# Patient Record
Sex: Female | Born: 1935
Health system: Southern US, Community
[De-identification: ages and names within clinical notes are randomized; demographics above are authoritative.]

## PROBLEM LIST (undated history)

## (undated) DIAGNOSIS — C801 Malignant (primary) neoplasm, unspecified: Secondary | ICD-10-CM

## (undated) DIAGNOSIS — E119 Type 2 diabetes mellitus without complications: Secondary | ICD-10-CM

## (undated) DIAGNOSIS — I6529 Occlusion and stenosis of unspecified carotid artery: Secondary | ICD-10-CM

## (undated) DIAGNOSIS — E7800 Pure hypercholesterolemia, unspecified: Secondary | ICD-10-CM

## (undated) DIAGNOSIS — E039 Hypothyroidism, unspecified: Secondary | ICD-10-CM

## (undated) DIAGNOSIS — K219 Gastro-esophageal reflux disease without esophagitis: Secondary | ICD-10-CM

## (undated) DIAGNOSIS — G629 Polyneuropathy, unspecified: Secondary | ICD-10-CM

## (undated) DIAGNOSIS — I10 Essential (primary) hypertension: Secondary | ICD-10-CM

## (undated) DIAGNOSIS — Z800 Family history of malignant neoplasm of digestive organs: Secondary | ICD-10-CM

## (undated) DIAGNOSIS — Z8739 Personal history of other diseases of the musculoskeletal system and connective tissue: Secondary | ICD-10-CM

## (undated) DIAGNOSIS — R1310 Dysphagia, unspecified: Secondary | ICD-10-CM

## (undated) HISTORY — PX: BACK SURGERY: SHX140

## (undated) HISTORY — DX: Essential (primary) hypertension: I10

## (undated) HISTORY — DX: Hypothyroidism, unspecified: E03.9

## (undated) HISTORY — DX: Family history of malignant neoplasm of digestive organs: Z80.0

## (undated) HISTORY — DX: Occlusion and stenosis of unspecified carotid artery: I65.29

## (undated) HISTORY — PX: CHOLECYSTECTOMY: SHX55

## (undated) HISTORY — PX: KNEE SURGERY: SHX244

## (undated) HISTORY — PX: SPINE SURGERY: SHX786

## (undated) HISTORY — DX: Pure hypercholesterolemia, unspecified: E78.00

## (undated) HISTORY — PX: BREAST LUMPECTOMY: SHX2

---

## 2007-02-28 ENCOUNTER — Ambulatory Visit (HOSPITAL_COMMUNITY): Admission: RE | Admit: 2007-02-28 | Discharge: 2007-02-28 | Payer: Self-pay | Admitting: General Surgery

## 2007-03-02 ENCOUNTER — Ambulatory Visit (HOSPITAL_COMMUNITY): Admission: RE | Admit: 2007-03-02 | Discharge: 2007-03-02 | Payer: Self-pay | Admitting: General Surgery

## 2007-05-19 ENCOUNTER — Ambulatory Visit: Admission: RE | Admit: 2007-05-19 | Discharge: 2007-07-19 | Payer: Self-pay | Admitting: Radiation Oncology

## 2010-04-30 ENCOUNTER — Ambulatory Visit: Payer: Self-pay | Admitting: Internal Medicine

## 2010-04-30 ENCOUNTER — Ambulatory Visit (HOSPITAL_COMMUNITY): Admission: RE | Admit: 2010-04-30 | Discharge: 2010-04-30 | Payer: Self-pay | Admitting: Internal Medicine

## 2010-08-10 ENCOUNTER — Encounter: Payer: Self-pay | Admitting: General Surgery

## 2010-10-02 LAB — GLUCOSE, CAPILLARY: Glucose-Capillary: 133 mg/dL — ABNORMAL HIGH (ref 70–99)

## 2012-09-07 ENCOUNTER — Encounter: Payer: Self-pay | Admitting: Internal Medicine

## 2012-09-07 DIAGNOSIS — N63 Unspecified lump in unspecified breast: Secondary | ICD-10-CM

## 2012-09-07 DIAGNOSIS — C50919 Malignant neoplasm of unspecified site of unspecified female breast: Secondary | ICD-10-CM

## 2012-09-19 ENCOUNTER — Encounter: Payer: Self-pay | Admitting: Internal Medicine

## 2012-10-10 ENCOUNTER — Encounter (INDEPENDENT_AMBULATORY_CARE_PROVIDER_SITE_OTHER): Payer: Self-pay | Admitting: Internal Medicine

## 2012-10-10 ENCOUNTER — Other Ambulatory Visit (INDEPENDENT_AMBULATORY_CARE_PROVIDER_SITE_OTHER): Payer: Self-pay | Admitting: *Deleted

## 2012-10-10 ENCOUNTER — Telehealth (INDEPENDENT_AMBULATORY_CARE_PROVIDER_SITE_OTHER): Payer: Self-pay | Admitting: *Deleted

## 2012-10-10 ENCOUNTER — Encounter (HOSPITAL_COMMUNITY): Payer: Self-pay | Admitting: Pharmacy Technician

## 2012-10-10 ENCOUNTER — Ambulatory Visit (INDEPENDENT_AMBULATORY_CARE_PROVIDER_SITE_OTHER): Payer: Medicare Other | Admitting: Internal Medicine

## 2012-10-10 VITALS — BP 166/70 | HR 72 | Temp 97.7°F | Ht 66.0 in | Wt 197.8 lb

## 2012-10-10 DIAGNOSIS — R1314 Dysphagia, pharyngoesophageal phase: Secondary | ICD-10-CM

## 2012-10-10 DIAGNOSIS — D649 Anemia, unspecified: Secondary | ICD-10-CM | POA: Insufficient documentation

## 2012-10-10 DIAGNOSIS — K625 Hemorrhage of anus and rectum: Secondary | ICD-10-CM

## 2012-10-10 DIAGNOSIS — Z1211 Encounter for screening for malignant neoplasm of colon: Secondary | ICD-10-CM

## 2012-10-10 DIAGNOSIS — I10 Essential (primary) hypertension: Secondary | ICD-10-CM | POA: Insufficient documentation

## 2012-10-10 DIAGNOSIS — E039 Hypothyroidism, unspecified: Secondary | ICD-10-CM | POA: Insufficient documentation

## 2012-10-10 DIAGNOSIS — R1310 Dysphagia, unspecified: Secondary | ICD-10-CM

## 2012-10-10 DIAGNOSIS — Z800 Family history of malignant neoplasm of digestive organs: Secondary | ICD-10-CM

## 2012-10-10 DIAGNOSIS — E7800 Pure hypercholesterolemia, unspecified: Secondary | ICD-10-CM | POA: Insufficient documentation

## 2012-10-10 MED ORDER — PEG-KCL-NACL-NASULF-NA ASC-C 100 G PO SOLR: 1.0000 | Freq: Once | ORAL | Status: DC

## 2012-10-10 NOTE — Telephone Encounter (Signed)
Patient needs movi prep 

## 2012-10-10 NOTE — Progress Notes (Addendum)
Subjective:     Patient ID: Rhonda Mejia, female   DOB: Jan 14, 1929, 79 y.o.   MRN: [id number]  HPI Presents today with c/o that last week she went to the [facility] and had a BM. She said the stool gushed out. She looked in the commode and saw bright red rectal bleeding. No further weight loss. She did strain and she tells me she stays constipated. She has bloating. She usually has a BM bout every 4-5 days. She takes a stool softner and a laxative twice a week. Appetite is good. No weight loss.  No problems with dysphagia to solids. Sometimes pills fill like they are lodging.  Patient is requesting an EGD and colonoscopy.    EGD/Colonoscopy 04/30/2010 Iron deficiency anemia, Family hx of colon cancer (brother). Normal EGD.Large cecal AVM which was not bleeding, but was ablated with argon plasma coagulator. Another small AVE malformation noted at sigmoid colon, not treated. External hemorrhoids.  Review of Systems see hpi Current Outpatient Prescriptions  Medication Sig Dispense Refill  . allopurinol (ZYLOPRIM) 300 MG tablet Take 300 mg by mouth daily.      . fish oil-omega-3 fatty acids 1000 MG capsule Take 2 g by mouth daily.      . folic acid (FOLVITE) 1 MG tablet Take 1 mg by mouth daily.      Marland Kitchen gemfibrozil (LOPID) 600 MG tablet Take 600 mg by mouth 2 (two) times daily before a meal.      . levothyroxine (SYNTHROID, LEVOTHROID) 100 MCG tablet Take 100 mcg by mouth daily.      Marland Kitchen lisinopril (PRINIVIL,ZESTRIL) 40 MG tablet Take 40 mg by mouth daily.      . Multiple Vitamin (MULTIVITAMIN) tablet Take 1 tablet by mouth daily.      Marland Kitchen omeprazole (PRILOSEC) 40 MG capsule Take 40 mg by mouth daily.      Marland Kitchen pyridOXINE (VITAMIN B-6) 100 MG tablet Take 100 mg by mouth daily.      . traMADol (ULTRAM) 50 MG tablet Take 50 mg by mouth every 6 (six) hours as needed for pain.      Marland Kitchen venlafaxine (EFFEXOR) 75 MG tablet Take 75 mg by mouth 2 (two) times daily.      . vitamin B-12 (CYANOCOBALAMIN) 1000 MCG  tablet Take 1,000 mcg by mouth daily. 5,000 daily       No current facility-administered medications for this visit.   No current outpatient prescriptions on file prior to visit.   No current facility-administered medications on file prior to visit.   Past Medical History  Diagnosis Date  . Family hx of colon cancer   . Hypertension     for over 20 yrs  . High cholesterol   . Hypothyroid         Objective:   Physical Exam  Filed Vitals:   10/10/12 1024  BP: 166/70  Pulse: 72  Temp: 97.7 F (36.5 C)  Height: 5\' 6"  (1.676 m)  Weight: 197 lb 12.8 oz (89.721 kg)  Alert and oriented. Skin warm and dry. Oral mucosa is moist.   . Sclera anicteric, conjunctivae is pink. Thyroid not enlarged. No cervical lymphadenopathy. Lungs clear. Heart regular rate and rhythm.  Abdomen is soft. Bowel sounds are positive. No hepatomegaly. No abdominal masses felt. No tenderness.  No edema to lower extremities.       Assessment:    Rectal bleeding. Hx of AVMs. Family hx of colon cancer.    Pill dysphagia. Patient is requesting an  EGD/ED also. Hx of anemia. Will get a CBC today.  Plan:    EGD/Colonoscopy with Dr. Karilyn Cota  . CBC today

## 2012-10-10 NOTE — Patient Instructions (Addendum)
EGD/ED and colonoscopy. The risks and benefits such as perforation, bleeding, and infection were reviewed with the patient and is agreeable.

## 2012-10-11 LAB — CBC WITH DIFFERENTIAL/PLATELET
Basophils Relative: 1 % (ref 0–1)
Eosinophils Absolute: 0.2 10*3/uL (ref 0.0–0.7)
Eosinophils Relative: 3 % (ref 0–5)
Hemoglobin: 11.6 g/dL — ABNORMAL LOW (ref 12.0–15.0)
Lymphs Abs: 3.1 10*3/uL (ref 0.7–4.0)
MCH: 30.1 pg (ref 26.0–34.0)
MCHC: 33.3 g/dL (ref 30.0–36.0)
MCV: 90.2 fL (ref 78.0–100.0)
Monocytes Relative: 7 % (ref 3–12)
Neutrophils Relative %: 53 % (ref 43–77)
Platelets: 232 10*3/uL (ref 150–400)
RBC: 3.86 MIL/uL — ABNORMAL LOW (ref 3.87–5.11)

## 2012-10-12 ENCOUNTER — Encounter (INDEPENDENT_AMBULATORY_CARE_PROVIDER_SITE_OTHER): Payer: Self-pay

## 2012-10-21 ENCOUNTER — Encounter (HOSPITAL_COMMUNITY): Payer: Self-pay | Admitting: *Deleted

## 2012-10-21 ENCOUNTER — Ambulatory Visit (HOSPITAL_COMMUNITY)
Admission: RE | Admit: 2012-10-21 | Discharge: 2012-10-21 | Disposition: A | Discharge status: Home / Self Care | Payer: Medicare Other | Source: Ambulatory Visit | Attending: Internal Medicine | Admitting: Internal Medicine

## 2012-10-21 ENCOUNTER — Encounter (HOSPITAL_COMMUNITY)
Admission: RE | Disposition: A | Discharge status: Home / Self Care | Payer: Self-pay | Source: Ambulatory Visit | Attending: Internal Medicine

## 2012-10-21 DIAGNOSIS — Z800 Family history of malignant neoplasm of digestive organs: Secondary | ICD-10-CM

## 2012-10-21 DIAGNOSIS — I10 Essential (primary) hypertension: Secondary | ICD-10-CM | POA: Insufficient documentation

## 2012-10-21 DIAGNOSIS — K625 Hemorrhage of anus and rectum: Secondary | ICD-10-CM

## 2012-10-21 DIAGNOSIS — R1314 Dysphagia, pharyngoesophageal phase: Secondary | ICD-10-CM

## 2012-10-21 DIAGNOSIS — K921 Melena: Secondary | ICD-10-CM | POA: Insufficient documentation

## 2012-10-21 DIAGNOSIS — K644 Residual hemorrhoidal skin tags: Secondary | ICD-10-CM | POA: Insufficient documentation

## 2012-10-21 DIAGNOSIS — K6389 Other specified diseases of intestine: Secondary | ICD-10-CM

## 2012-10-21 DIAGNOSIS — D126 Benign neoplasm of colon, unspecified: Secondary | ICD-10-CM | POA: Insufficient documentation

## 2012-10-21 DIAGNOSIS — K5520 Angiodysplasia of colon without hemorrhage: Secondary | ICD-10-CM

## 2012-10-21 HISTORY — PX: COLONOSCOPY: SHX5424

## 2012-10-21 HISTORY — DX: Gastro-esophageal reflux disease without esophagitis: K21.9

## 2012-10-21 HISTORY — DX: Dysphagia, unspecified: R13.10

## 2012-10-21 SURGERY — COLONOSCOPY
Anesthesia: Moderate Sedation | Wound class: Clean Contaminated

## 2012-10-21 MED ORDER — MIDAZOLAM HCL 5 MG/5ML IJ SOLN
INTRAMUSCULAR | Status: AC
  Filled 2012-10-21: qty 10

## 2012-10-21 MED ORDER — MIDAZOLAM HCL 5 MG/5ML IJ SOLN
INTRAMUSCULAR | Status: DC | PRN
  Administered 2012-10-21: 2 mg via INTRAVENOUS
  Administered 2012-10-21 (×3): 1 mg via INTRAVENOUS

## 2012-10-21 MED ORDER — SODIUM CHLORIDE 0.9 % IV SOLN
INTRAVENOUS | Status: DC
  Administered 2012-10-21: 07:00:00 via INTRAVENOUS

## 2012-10-21 MED ORDER — MEPERIDINE HCL 50 MG/ML IJ SOLN
INTRAMUSCULAR | Status: AC
  Filled 2012-10-21: qty 1

## 2012-10-21 MED ORDER — MEPERIDINE HCL 50 MG/ML IJ SOLN
INTRAMUSCULAR | Status: DC | PRN
  Administered 2012-10-21 (×2): 25 mg via INTRAVENOUS

## 2012-10-21 MED ORDER — STERILE WATER FOR IRRIGATION IR SOLN
Status: DC | PRN
  Administered 2012-10-21: 08:00:00

## 2012-10-21 MED ORDER — PSYLLIUM 28 % PO PACK: 1.0000 | PACK | Freq: Every day | ORAL | Status: DC

## 2012-10-21 MED ORDER — POLYETHYLENE GLYCOL 3350 17 G PO PACK: 17.0000 g | PACK | Freq: Every day | ORAL | Status: DC

## 2012-10-21 NOTE — H&P (Signed)
Rhonda Mejia is an 79 y.o. female.   Chief Complaint: Patient is here for EGD and colonoscopy. HPI: Patient is a 79 year old Caucasian female who presents with single episode of large volume painless hematochezia. She has history of colonic AVMs. Her last exam was in October 2007. She denies abdominal pain or change in her bowel habits. She also complains of dysphagia soon to large pills. She has no difficulty swallowing liquids or solids. She denies heartburn. Family history significant for colon carcinoma in a brother in his 80s and died at 76 of metastatic disease.  Past Medical History  Diagnosis Date  . Family hx of colon cancer   . Hypertension     for over 20 yrs  . High cholesterol   . Hypothyroid   . GERD (gastroesophageal reflux disease)   . Pill dysphagia     Past Surgical History  Procedure Laterality Date  . Breast lumpectomy      left breast  . Knee surgery      arthroscopy  . Cholecystectomy      Family History  Problem Relation Age of Onset  . Colon cancer Brother    Social History:  reports that she has never smoked. She does not have any smokeless tobacco history on file. She reports that she does not drink alcohol or use illicit drugs.  Allergies:  Allergies  Allergen Reactions  . Penicillins     Rash     Medications Prior to Admission  Medication Sig Dispense Refill  . allopurinol (ZYLOPRIM) 300 MG tablet Take 300 mg by mouth daily.      . fish oil-omega-3 fatty acids 1000 MG capsule Take 2 g by mouth daily.      . folic acid (FOLVITE) 1 MG tablet Take 1 mg by mouth daily.      Marland Kitchen gemfibrozil (LOPID) 600 MG tablet Take 600 mg by mouth 2 (two) times daily before a meal.      . levothyroxine (SYNTHROID, LEVOTHROID) 100 MCG tablet Take 100 mcg by mouth daily.      Marland Kitchen lisinopril (PRINIVIL,ZESTRIL) 40 MG tablet Take 40 mg by mouth daily.      . Multiple Vitamin (MULTIVITAMIN) tablet Take 1 tablet by mouth daily.      Marland Kitchen omeprazole (PRILOSEC) 40 MG  capsule Take 40 mg by mouth daily.      . peg 3350 powder (MOVIPREP) 100 G SOLR Take 1 kit (100 g total) by mouth once.  1 kit  0  . pyridOXINE (VITAMIN B-6) 100 MG tablet Take 100 mg by mouth daily.      . traMADol (ULTRAM) 50 MG tablet Take 50 mg by mouth every 6 (six) hours as needed for pain.      Marland Kitchen venlafaxine (EFFEXOR) 75 MG tablet Take 75 mg by mouth 2 (two) times daily.      . vitamin B-12 (CYANOCOBALAMIN) 1000 MCG tablet Take 1,000 mcg by mouth daily. 5,000 daily        No results found for this or any previous visit (from the past 48 hour(s)). No results found.  ROS  Blood pressure 158/63, temperature 97.4 F (36.3 C), temperature source Oral, resp. rate 18, height 5\' 6"  (1.676 m), weight 197 lb (89.359 kg), SpO2 96.00%. Physical Exam  Constitutional: She appears well-developed and well-nourished.  HENT:  Mouth/Throat: Oropharynx is clear and moist.  Eyes: Conjunctivae are normal.  Neck: No thyromegaly present.  Cardiovascular: Normal rate, regular rhythm and normal heart sounds.   No  murmur heard. Respiratory: Effort normal and breath sounds normal.  GI: Soft. She exhibits no distension and no mass.  Musculoskeletal: She exhibits no edema.  Lymphadenopathy:    She has no cervical adenopathy.  Neurological: She is alert.  Skin: Skin is warm and dry.     Assessment/Plan Painless hematochezia. Family history of colon carcinoma. Pill dysphagia she has no difficulty swallowing solids or liquids. She does not need EGD. Her pills can be substituted for smaller size pills. Diagnostic colonoscopy.  Mikaya Bunner U 10/21/2012, 7:31 AM

## 2012-10-21 NOTE — Op Note (Signed)
COLONOSCOPY PROCEDURE REPORT  PATIENT:  Rhonda Mejia  MR#:  [id number] Birthdate:  [birth year], 79 y.o., female Endoscopist:  Dr. Malissa Hippo, MD Referred By:  Dr. Kirstie Peri, MD Procedure Date: 10/21/2012  Procedure:   Colonoscopy  Indications:  Patient is a 79 year-old Caucasian female who presents with single episode of large volume painless hematochezia. Family history significant for colon carcinoma in a brother who died at 67 of metastatic disease. Patient's last colonoscopy was in October 2011. She also complains of constipation.  Informed Consent:  The procedure and risks were reviewed with the patient and informed consent was obtained.  Medications:  Demerol 50 mg IV Versed 5 mg IV  Description of procedure:  After a digital rectal exam was performed, that colonoscope was advanced from the anus through the rectum and colon to the area of the cecum, ileocecal valve and appendiceal orifice. The cecum was deeply intubated. These structures were well-seen and photographed for the record. From the level of the cecum and ileocecal valve, the scope was slowly and cautiously withdrawn. The mucosal surfaces were carefully surveyed utilizing scope tip to flexion to facilitate fold flattening as needed. The scope was pulled down into the rectum where a thorough exam including retroflexion was performed.  Findings:   Prep satisfactory. Two small polyps ablated via cold biopsy and submitted together. These were located at cecum and ascending colon. Cecal polyp was about 5 mm and cold snared. Three small polyps or related via cold biopsy and submitted together. One was located at distal transverse colon and the two others at splenic flexure. Single small AV malformation and descending colon without stigmata of bleeding and was not treated. Mild mucosal pigmentation involving distal half of the colon. Normal rectal mucosa. Prominent hemorrhoids below the dentate  line.   Therapeutic/Diagnostic Maneuvers Performed:  See above  Complications:  None  Cecal Withdrawal Time:  12 minutes  Impression:  Examination performed to cecum. 5 mm cecal polyp was cold snared and submitted with another small polyp from ascending colon ablated via cold biopsy. 3 small polyps are ablated via cold biopsy from region of splenic flexure and submitted together. Single small AV malformation at ascending colon without stigmata of bleed. Mild changes of melanosis coli. Moderate size external hemorrhoids felt to be source of patient's hematochezia.  Recommendations:  Standard instructions given. High fiber diet plus Metamucil 3-4 g by mouth daily. MiraLax 17 g by mouth each bedtime I will contact patient with biopsy results and further recommendations.  Rhonda Mejia  10/21/2012 8:28 AM  CC: Dr. Kirstie Peri, MD & Dr. Bonnetta Barry ref. provider found

## 2012-10-24 ENCOUNTER — Encounter (HOSPITAL_COMMUNITY): Payer: Self-pay | Admitting: Internal Medicine

## 2012-11-01 ENCOUNTER — Encounter (INDEPENDENT_AMBULATORY_CARE_PROVIDER_SITE_OTHER): Payer: Self-pay | Admitting: *Deleted

## 2013-01-17 ENCOUNTER — Encounter (INDEPENDENT_AMBULATORY_CARE_PROVIDER_SITE_OTHER): Payer: Medicare Other | Admitting: Internal Medicine

## 2013-01-17 DIAGNOSIS — Z170 Estrogen receptor positive status [ER+]: Secondary | ICD-10-CM

## 2013-01-17 DIAGNOSIS — N898 Other specified noninflammatory disorders of vagina: Secondary | ICD-10-CM

## 2013-01-17 DIAGNOSIS — C50919 Malignant neoplasm of unspecified site of unspecified female breast: Secondary | ICD-10-CM

## 2013-06-07 ENCOUNTER — Encounter (INDEPENDENT_AMBULATORY_CARE_PROVIDER_SITE_OTHER): Payer: Self-pay | Admitting: Internal Medicine

## 2013-06-07 ENCOUNTER — Other Ambulatory Visit (INDEPENDENT_AMBULATORY_CARE_PROVIDER_SITE_OTHER): Payer: Self-pay | Admitting: *Deleted

## 2013-06-07 ENCOUNTER — Ambulatory Visit (INDEPENDENT_AMBULATORY_CARE_PROVIDER_SITE_OTHER): Payer: Medicare Other | Admitting: Internal Medicine

## 2013-06-07 ENCOUNTER — Encounter (INDEPENDENT_AMBULATORY_CARE_PROVIDER_SITE_OTHER): Payer: Self-pay | Admitting: *Deleted

## 2013-06-07 VITALS — BP 170/60 | HR 72 | Temp 97.5°F | Ht 66.0 in | Wt 200.9 lb

## 2013-06-07 DIAGNOSIS — R1310 Dysphagia, unspecified: Secondary | ICD-10-CM

## 2013-06-07 NOTE — Progress Notes (Signed)
Subjective:     Patient ID: Rhonda Mejia, female   DOB: 1929-01-23, 79 y.o.   MRN: [id number]  HPI Here today with c/o dysphagia. She tells me pills are lodging in her esophagus. Foods also feel like they are lodging.  She has had symptoms for several months. Pills lodge daily.  She denies sore throat. Appetite is good. No weight loss.  She tells me Dr. Channing Mutters wanted her to see [place] before she had surgery concerning the dysphagia.   She is having back surgery 1st of the year by Dr. Channing Mutters.  10/31/2012 Colonoscopy (rectal bleeding): Impression:  Examination performed to cecum.  5 mm cecal polyp was cold snared and submitted with another small polyp from ascending colon ablated via cold biopsy.  3 small polyps are ablated via cold biopsy from region of splenic flexure and submitted together.  Single small AV malformation at ascending colon without stigmata of bleed.  Mild changes of melanosis coli.  Moderate size external hemorrhoids felt to be source of patient's hematochezia.  Review of Systems Current Outpatient Prescriptions  Medication Sig Dispense Refill  . acetaminophen (TYLENOL) 650 MG CR tablet Take 650 mg by mouth every 8 (eight) hours as needed for pain.      Marland Kitchen allopurinol (ZYLOPRIM) 300 MG tablet Take 300 mg by mouth daily.      Marland Kitchen amLODipine (NORVASC) 5 MG tablet Take 5 mg by mouth daily.      Marland Kitchen docusate sodium (COLACE) 100 MG capsule Take 100 mg by mouth daily as needed for mild constipation.      . folic acid (FOLVITE) 1 MG tablet Take 1 mg by mouth daily.      Marland Kitchen levothyroxine (SYNTHROID, LEVOTHROID) 100 MCG tablet Take 100 mcg by mouth daily.      Marland Kitchen lisinopril (PRINIVIL,ZESTRIL) 40 MG tablet Take 40 mg by mouth daily.      Marland Kitchen omeprazole (PRILOSEC) 40 MG capsule Take 40 mg by mouth daily.      . polyethylene glycol (MIRALAX / GLYCOLAX) packet Take 17 g by mouth daily.  14 each  0  . psyllium (METAMUCIL SMOOTH TEXTURE) 28 % packet Take 1 packet by mouth at bedtime.      .  pyridOXINE (VITAMIN B-6) 100 MG tablet Take 100 mg by mouth daily.      Marland Kitchen senna (SENOKOT) 8.6 MG tablet Take 1 tablet by mouth as needed for constipation.      . traMADol (ULTRAM) 50 MG tablet Take 50 mg by mouth every 6 (six) hours as needed for pain.      Marland Kitchen venlafaxine (EFFEXOR) 75 MG tablet Take 75 mg by mouth 2 (two) times daily.      . vitamin B-12 (CYANOCOBALAMIN) 1000 MCG tablet Take 1,000 mcg by mouth daily. 5,000 daily      . fish oil-omega-3 fatty acids 1000 MG capsule Take 2 g by mouth daily.      Marland Kitchen gemfibrozil (LOPID) 600 MG tablet Take 600 mg by mouth 2 (two) times daily before a meal.      . Multiple Vitamin (MULTIVITAMIN) tablet Take 1 tablet by mouth daily.       No current facility-administered medications for this visit.   Past Medical History  Diagnosis Date  . Family hx of colon cancer   . Hypertension     for over 20 yrs  . High cholesterol   . Hypothyroid   . GERD (gastroesophageal reflux disease)   . Pill dysphagia  Past Surgical History  Procedure Laterality Date  . Breast lumpectomy      left breast  . Knee surgery      arthroscopy  . Cholecystectomy    . Colonoscopy N/A 10/21/2012    Procedure: COLONOSCOPY;  Surgeon: Malissa Hippo, MD;  Location: [facility];  Service: Endoscopy;  Laterality: N/A;   Allergies  Allergen Reactions  . Penicillins     Rash         Objective:   Physical Exam There were no vitals filed for this visit. Filed Vitals:   06/07/13 1446  BP: 170/60  Pulse: 72  Temp: 97.5 F (36.4 C)  Height: 5\' 6"  (1.676 m)  Weight: 200 lb 14.4 oz (91.128 kg)   Alert and oriented. Skin warm and dry. Oral mucosa is moist.   . Sclera anicteric, conjunctivae is pink. Thyroid not enlarged. No cervical lymphadenopathy. Lungs clear. Heart regular rate and rhythm.  Abdomen is soft. Bowel sounds are positive. No hepatomegaly. No abdominal masses felt. No tenderness. 2+ edema to lower extremities.        Assessment:    Dysphagia to  solids and pills. Stricture, web need to be ruled out.     Plan:    EGD/ED with Dr. Karilyn Cota

## 2013-06-07 NOTE — Patient Instructions (Signed)
EGD/ED with Dr. Rehman. The risks and benefits such as perforation, bleeding, and infection were reviewed with the patient and is agreeable. 

## 2013-06-09 ENCOUNTER — Encounter (HOSPITAL_COMMUNITY): Payer: Self-pay | Admitting: *Deleted

## 2013-06-09 ENCOUNTER — Ambulatory Visit (HOSPITAL_COMMUNITY)
Admission: RE | Admit: 2013-06-09 | Discharge: 2013-06-09 | Disposition: A | Discharge status: Home / Self Care | Payer: Medicare Other | Source: Ambulatory Visit | Attending: Internal Medicine | Admitting: Internal Medicine

## 2013-06-09 ENCOUNTER — Encounter (HOSPITAL_COMMUNITY)
Admission: RE | Disposition: A | Discharge status: Home / Self Care | Payer: Self-pay | Source: Ambulatory Visit | Attending: Internal Medicine

## 2013-06-09 DIAGNOSIS — R1310 Dysphagia, unspecified: Secondary | ICD-10-CM

## 2013-06-09 DIAGNOSIS — I10 Essential (primary) hypertension: Secondary | ICD-10-CM | POA: Insufficient documentation

## 2013-06-09 DIAGNOSIS — K449 Diaphragmatic hernia without obstruction or gangrene: Secondary | ICD-10-CM

## 2013-06-09 DIAGNOSIS — K2960 Other gastritis without bleeding: Secondary | ICD-10-CM

## 2013-06-09 DIAGNOSIS — E7800 Pure hypercholesterolemia, unspecified: Secondary | ICD-10-CM | POA: Insufficient documentation

## 2013-06-09 HISTORY — PX: ESOPHAGOGASTRODUODENOSCOPY (EGD) WITH ESOPHAGEAL DILATION: SHX5812

## 2013-06-09 SURGERY — ESOPHAGOGASTRODUODENOSCOPY (EGD) WITH ESOPHAGEAL DILATION
Anesthesia: Moderate Sedation

## 2013-06-09 MED ORDER — SODIUM CHLORIDE 0.9 % IV SOLN
INTRAVENOUS | Status: DC
  Administered 2013-06-09: 13:00:00 via INTRAVENOUS

## 2013-06-09 MED ORDER — STERILE WATER FOR IRRIGATION IR SOLN
Status: DC | PRN
  Administered 2013-06-09: 13:00:00

## 2013-06-09 MED ORDER — MEPERIDINE HCL 25 MG/ML IJ SOLN
INTRAMUSCULAR | Status: DC | PRN
  Administered 2013-06-09 (×2): 25 mg via INTRAVENOUS

## 2013-06-09 MED ORDER — MIDAZOLAM HCL 5 MG/5ML IJ SOLN
INTRAMUSCULAR | Status: DC | PRN
  Administered 2013-06-09: 1 mg via INTRAVENOUS
  Administered 2013-06-09 (×2): 2 mg via INTRAVENOUS

## 2013-06-09 MED ORDER — MEPERIDINE HCL 50 MG/ML IJ SOLN
INTRAMUSCULAR | Status: AC
  Filled 2013-06-09: qty 1

## 2013-06-09 MED ORDER — MIDAZOLAM HCL 5 MG/5ML IJ SOLN
INTRAMUSCULAR | Status: AC
  Filled 2013-06-09: qty 10

## 2013-06-09 MED ORDER — BUTAMBEN-TETRACAINE-BENZOCAINE 2-2-14 % EX AERO
INHALATION_SPRAY | CUTANEOUS | Status: DC | PRN
  Administered 2013-06-09: 2 via TOPICAL

## 2013-06-09 NOTE — Op Note (Addendum)
EGD PROCEDURE REPORT  PATIENT:  Rhonda Mejia  MR#:  [id number] Birthdate:  12/07/1928, 79 y.o., female Endoscopist:  Dr. Malissa Hippo, MD Referred By:  Dr. Trey Sailors, MD Procedure Date: 06/09/2013  Procedure:   EGD with ED.  Indications:  Patient is an 79 year old Caucasian female who presents with intermittent solid food dysphagia a few months duration. She has chronic GERD and heartburn is well controlled with therapy.            Informed Consent:  The risks, benefits, alternatives & imponderables which include, but are not limited to, bleeding, infection, perforation, drug reaction and potential missed lesion have been reviewed.  The potential for biopsy, lesion removal, esophageal dilation, etc. have also been discussed.  Questions have been answered.  All parties agreeable.  Please see history & physical in medical record for more information.  Medications:  Demerol 50 mg IV Versed 5 mg IV Cetacaine spray topically for oropharyngeal anesthesia  Description of procedure:  The endoscope was introduced through the mouth and advanced to the second portion of the duodenum without difficulty or limitations. The mucosal surfaces were surveyed very carefully during advancement of the scope and upon withdrawal.  Findings:  Esophagus:  Mucosa of the esophagus was normal. GE junction was unremarkable without ring or stricture formation. GEJ:  39 cm Hiatus:  41 cm Stomach:  Stomach was empty and distended very well with insufflation. Folds in the proximal stomach were normal. Examination of mucosa at body was normal. Antral mucosa revealed patchy erythema and edema but no erosions or ulcers noted. Following channel was patent. Angularis fundus and cardia were examined by retroflexing  the scope and were normal. Duodenum:  Normal bulbar and post bulbar mucosa.  Therapeutic/Diagnostic Maneuvers Performed:   Esophagus was dilated by passing 56 [place] Maloney dilator to full  insertion. Esophageal mucosa was reexamined post dilation and no mucosal disruption noted.  Complications:  None  Impression: No evidence of erosive esophagitis ring or stricture formation. Small sliding hiatal hernia. Nonerosive antral gastritis. Esophagus dilated by passing 56 [place] Maloney dilator given history of solid food dysphagia.  I am concerned patient may have esophageal motility disorder.  Recommendations:  H. pylori serology will be checked today. Continue anti-reflux measures and omeprazole as before. Patient advised to chew her food thoroughly and eat slowly.  Patient will call office progress report in one week.  Leahanna Buser U  06/09/2013  1:55 PM  CC: Dr. Kirstie Peri, MD & Dr. Bonnetta Barry ref. provider found CC  Dr. Trey Sailors, MD

## 2013-06-09 NOTE — H&P (Signed)
Rhonda Mejia is an 79 y.o. female.   Chief Complaint: Patient is here for EGD and ED. HPI: Patient is a 79 year old Caucasian female who presents with a few months history of dysphagia to solids. She has most groups. She points to lower sternal area as the site  bolus obstruction. Her heartburn is well controlled with therapy. She denies nausea vomiting or melena. She is having difficulty with constipation and pain at the time of defecation. She had colonoscopy in April this year.  Past Medical History  Diagnosis Date  . Family hx of colon cancer   . Hypertension     for over 20 yrs  . High cholesterol   . Hypothyroid   . GERD (gastroesophageal reflux disease)   . Pill dysphagia     Past Surgical History  Procedure Laterality Date  . Breast lumpectomy      left breast  . Knee surgery      arthroscopy  . Cholecystectomy    . Colonoscopy N/A 10/21/2012    Procedure: COLONOSCOPY;  Surgeon: Malissa Hippo, MD;  Location: [facility];  Service: Endoscopy;  Laterality: N/A;    Family History  Problem Relation Age of Onset  . Colon cancer Brother    Social History:  reports that she has never smoked. She does not have any smokeless tobacco history on file. She reports that she does not drink alcohol or use illicit drugs.  Allergies:  Allergies  Allergen Reactions  . Penicillins     Rash   . Sulfa Antibiotics     Unknown reaction     Medications Prior to Admission  Medication Sig Dispense Refill  . amLODipine (NORVASC) 5 MG tablet Take 5 mg by mouth daily.      Marland Kitchen docusate sodium (COLACE) 100 MG capsule Take 100 mg by mouth daily as needed for mild constipation.      Marland Kitchen gemfibrozil (LOPID) 600 MG tablet Take 600 mg by mouth 2 (two) times daily before a meal.      . levothyroxine (SYNTHROID, LEVOTHROID) 100 MCG tablet Take 100 mcg by mouth daily.      Marland Kitchen lisinopril (PRINIVIL,ZESTRIL) 40 MG tablet Take 40 mg by mouth daily.      Marland Kitchen omeprazole (PRILOSEC) 40 MG capsule Take 40  mg by mouth daily.      Marland Kitchen senna (SENOKOT) 8.6 MG tablet Take 1 tablet by mouth as needed for constipation.      . traMADol (ULTRAM) 50 MG tablet Take 50 mg by mouth every 6 (six) hours as needed for pain.      Marland Kitchen venlafaxine (EFFEXOR) 75 MG tablet Take 75 mg by mouth 2 (two) times daily.      Marland Kitchen acetaminophen (TYLENOL) 650 MG CR tablet Take 650 mg by mouth every 8 (eight) hours as needed for pain.      Marland Kitchen allopurinol (ZYLOPRIM) 300 MG tablet Take 300 mg by mouth daily.      . folic acid (FOLVITE) 1 MG tablet Take 1 mg by mouth daily.      Marland Kitchen pyridOXINE (VITAMIN B-6) 100 MG tablet Take 100 mg by mouth daily.      . vitamin B-12 (CYANOCOBALAMIN) 1000 MCG tablet Take 1,000 mcg by mouth daily. 5,000 daily        No results found for this or any previous visit (from the past 48 hour(s)). No results found.  ROS  Blood pressure 144/66, pulse 82, temperature 98.5 F (36.9 C), temperature source Oral, resp.  rate 13, height 5\' 6"  (1.676 m), weight 200 lb (90.719 kg), SpO2 98.00%. Physical Exam  Constitutional: She appears well-developed and well-nourished.  HENT:  Mouth/Throat: Oropharynx is clear and moist.  Eyes: Conjunctivae are normal. No scleral icterus.  Neck: No thyromegaly present.  Cardiovascular: Normal rate, regular rhythm and normal heart sounds.   No murmur heard. Respiratory: Effort normal and breath sounds normal.  GI: Soft. She exhibits no distension and no mass. There is no tenderness.  Musculoskeletal: She exhibits no edema.  Lymphadenopathy:    She has no cervical adenopathy.  Neurological: She is alert.  Skin: Skin is warm and dry.     Assessment/Plan Solid food dysphagia. Chronic GERD. EGD and ED.  REHMAN,NAJEEB U 06/09/2013, 1:32 PM

## 2013-06-19 ENCOUNTER — Encounter (HOSPITAL_COMMUNITY): Payer: Self-pay | Admitting: Internal Medicine

## 2014-12-25 NOTE — Patient Instructions (Signed)
Your procedure is scheduled on:  12/31/14  Report to [facility] at 08:30 AM.  Call this number if you have problems the morning of surgery: [phone]   Remember:   Do not eat food or drink liquids after midnight.   Take these medicines the morning of surgery with A SIP OF WATER: Amlodipine, Gabapentin, Levothyroxine, Lisinopril, Metoprolol, Omeprazole and Venlafaxine. You may take your Tramadol if needed.   Do not wear jewelry, make-up or nail polish.  Do not wear lotions, powders, or perfumes. You may wear deodorant.  Do not bring valuables to the hospital.  [facility] is not responsible for any belongings or valuables.               Contacts, dentures or bridgework may not be worn into surgery.               Patients discharged the day of surgery will not be allowed to drive home.   Special Instructions: Start using your eye drops prior to surgery as directed by your eye doctor.   Please read over the following fact sheets that you were given: Anesthesia Post-op Instructions and Care and Recovery After Surgery     Cataract Surgery  A cataract is a clouding of the lens of the eye. When a lens becomes cloudy, vision is reduced based on the degree and nature of the clouding. Surgery may be needed to improve vision. Surgery removes the cloudy lens and usually replaces it with a substitute lens (intraocular lens, IOL). LET YOUR EYE DOCTOR KNOW ABOUT:  Allergies to food or medicine.  Medicines taken including herbs, eyedrops, over-the-counter medicines, and creams.  Use of steroids (by mouth or creams).  Previous problems with anesthetics or numbing medicine.  History of bleeding problems or blood clots.  Previous surgery.  Other health problems, including diabetes and kidney problems.  Possibility of pregnancy, if this applies. RISKS AND COMPLICATIONS  Infection.  Inflammation of the eyeball (endophthalmitis) that can spread to both eyes (sympathetic ophthalmia).  Poor  wound healing.  If an IOL is inserted, it can later fall out of proper position. This is very uncommon.  Clouding of the part of your eye that holds an IOL in place. This is called an "after-cataract." These are uncommon, but easily treated. BEFORE THE PROCEDURE  Do not eat or drink anything except small amounts of water for 8 to 12 before your surgery, or as directed by your caregiver.  Unless you are told otherwise, continue any eyedrops you have been prescribed.  Talk to your primary caregiver about all other medicines that you take (both prescription and non-prescription). In some cases, you may need to stop or change medicines near the time of your surgery. This is most important if you are taking blood-thinning medicine.Do not stop medicines unless you are told to do so.  Arrange for someone to drive you to and from the procedure.  Do not put contact lenses in either eye on the day of your surgery. PROCEDURE There is more than one method for safely removing a cataract. Your doctor can explain the differences and help determine which is best for you. Phacoemulsification surgery is the most common form of cataract surgery.  An injection is given behind the eye or eyedrops are given to make this a painless procedure.  A small cut (incision) is made on the edge of the clear, dome-shaped surface that covers the front of the eye (cornea).  A tiny probe is painlessly inserted into  the eye. This device gives off ultrasound waves that soften and break up the cloudy center of the lens. This makes it easier for the cloudy lens to be removed by suction.  An IOL may be implanted.  The normal lens of the eye is covered by a clear capsule. Part of that capsule is intentionally left in the eye to support the IOL.  Your surgeon may or may not use stitches to close the incision. There are other forms of cataract surgery that require a larger incision and stiches to close the eye. This approach  is taken in cases where the doctor feels that the cataract cannot be easily removed using phacoemulsification. AFTER THE PROCEDURE  When an IOL is implanted, it does not need care. It becomes a permanent part of your eye and cannot be seen or felt.  Your doctor will schedule follow-up exams to check on your progress.  Review your other medicines with your doctor to see which can be resumed after surgery.  Use eyedrops or take medicine as prescribed by your doctor. Document Released: 06/25/2011 Document Revised: 09/28/2011 Document Reviewed: 06/25/2011 [facility] Patient Information 2013 [place].    PATIENT INSTRUCTIONS POST-ANESTHESIA  IMMEDIATELY FOLLOWING SURGERY:  Do not drive or operate machinery for the first twenty four hours after surgery.  Do not make any important decisions for twenty four hours after surgery or while taking narcotic pain medications or sedatives.  If you develop intractable nausea and vomiting or a severe headache please notify your doctor immediately.  FOLLOW-UP:  Please make an appointment with your surgeon as instructed. You do not need to follow up with anesthesia unless specifically instructed to do so.  WOUND CARE INSTRUCTIONS (if applicable):  Keep a dry clean dressing on the anesthesia/puncture wound site if there is drainage.  Once the wound has quit draining you may leave it open to air.  Generally you should leave the bandage intact for twenty four hours unless there is drainage.  If the epidural site drains for more than 36-48 hours please call the anesthesia department.  QUESTIONS?:  Please feel free to call your physician or the hospital operator if you have any questions, and they will be happy to assist you.

## 2014-12-26 ENCOUNTER — Encounter (HOSPITAL_COMMUNITY)
Admission: RE | Admit: 2014-12-26 | Discharge: 2014-12-26 | Disposition: A | Discharge status: Home / Self Care | Payer: Medicare Other | Source: Ambulatory Visit | Attending: Ophthalmology | Admitting: Ophthalmology

## 2014-12-26 ENCOUNTER — Encounter (HOSPITAL_COMMUNITY): Payer: Self-pay

## 2014-12-26 DIAGNOSIS — H2512 Age-related nuclear cataract, left eye: Secondary | ICD-10-CM | POA: Diagnosis not present

## 2014-12-26 DIAGNOSIS — Z01818 Encounter for other preprocedural examination: Secondary | ICD-10-CM | POA: Insufficient documentation

## 2014-12-26 HISTORY — DX: Personal history of other diseases of the musculoskeletal system and connective tissue: Z87.39

## 2014-12-26 HISTORY — DX: Malignant (primary) neoplasm, unspecified: C80.1

## 2014-12-26 HISTORY — DX: Polyneuropathy, unspecified: G62.9

## 2014-12-26 HISTORY — DX: Type 2 diabetes mellitus without complications: E11.9

## 2014-12-26 LAB — CBC WITH DIFFERENTIAL/PLATELET
Basophils Absolute: 0 10*3/uL (ref 0.0–0.1)
Basophils Relative: 0 % (ref 0–1)
EOS ABS: 0.5 10*3/uL (ref 0.0–0.7)
Eosinophils Relative: 6 % — ABNORMAL HIGH (ref 0–5)
HCT: 34.5 % — ABNORMAL LOW (ref 36.0–46.0)
Hemoglobin: 11 g/dL — ABNORMAL LOW (ref 12.0–15.0)
Lymphocytes Relative: 29 % (ref 12–46)
Lymphs Abs: 2.7 10*3/uL (ref 0.7–4.0)
MCH: 30.5 pg (ref 26.0–34.0)
MCHC: 31.9 g/dL (ref 30.0–36.0)
MCV: 95.6 fL (ref 78.0–100.0)
Monocytes Absolute: 0.7 10*3/uL (ref 0.1–1.0)
Monocytes Relative: 7 % (ref 3–12)
Neutro Abs: 5.3 10*3/uL (ref 1.7–7.7)
Neutrophils Relative %: 58 % (ref 43–77)
PLATELETS: 209 10*3/uL (ref 150–400)
RBC: 3.61 MIL/uL — AB (ref 3.87–5.11)
RDW: 15.3 % (ref 11.5–15.5)
WBC: 9.2 10*3/uL (ref 4.0–10.5)

## 2014-12-26 LAB — BASIC METABOLIC PANEL
Anion gap: 9 (ref 5–15)
BUN: 38 mg/dL — ABNORMAL HIGH (ref 6–20)
CO2: 29 mmol/L (ref 22–32)
Calcium: 10.6 mg/dL — ABNORMAL HIGH (ref 8.9–10.3)
Chloride: 104 mmol/L (ref 101–111)
Creatinine, Ser: 1.32 mg/dL — ABNORMAL HIGH (ref 0.44–1.00)
GFR calc Af Amer: 42 mL/min — ABNORMAL LOW (ref >60)
GFR calc non Af Amer: 36 mL/min — ABNORMAL LOW (ref >60)
Glucose, Bld: 134 mg/dL — ABNORMAL HIGH (ref 65–99)
Potassium: 4.8 mmol/L (ref 3.5–5.1)
SODIUM: 142 mmol/L (ref 135–145)

## 2014-12-26 NOTE — Pre-Procedure Instructions (Signed)
Patient given information to sign up for my chart at home. 

## 2014-12-28 MED ORDER — LIDOCAINE HCL 3.5 % OP GEL
OPHTHALMIC | Status: AC
  Filled 2014-12-28: qty 1

## 2014-12-28 MED ORDER — CYCLOPENTOLATE-PHENYLEPHRINE OP SOLN OPTIME - NO CHARGE
OPHTHALMIC | Status: AC
  Filled 2014-12-28: qty 2

## 2014-12-28 MED ORDER — TETRACAINE HCL 0.5 % OP SOLN
OPHTHALMIC | Status: AC
  Filled 2014-12-28: qty 2

## 2014-12-28 MED ORDER — PHENYLEPHRINE HCL 2.5 % OP SOLN
OPHTHALMIC | Status: AC
  Filled 2014-12-28: qty 15

## 2014-12-28 MED ORDER — NEOMYCIN-POLYMYXIN-DEXAMETH 3.5-10000-0.1 OP SUSP
OPHTHALMIC | Status: AC
  Filled 2014-12-28: qty 5

## 2014-12-28 MED ORDER — LIDOCAINE HCL (PF) 1 % IJ SOLN
INTRAMUSCULAR | Status: AC
  Filled 2014-12-28: qty 2

## 2014-12-31 ENCOUNTER — Encounter (HOSPITAL_COMMUNITY): Payer: Self-pay

## 2014-12-31 ENCOUNTER — Ambulatory Visit (HOSPITAL_COMMUNITY)
Admission: RE | Admit: 2014-12-31 | Discharge: 2014-12-31 | Disposition: A | Discharge status: Home / Self Care | Payer: Medicare Other | Source: Ambulatory Visit | Attending: Ophthalmology | Admitting: Ophthalmology

## 2014-12-31 ENCOUNTER — Encounter (HOSPITAL_COMMUNITY)
Admission: RE | Disposition: A | Discharge status: Home / Self Care | Payer: Self-pay | Source: Ambulatory Visit | Attending: Ophthalmology

## 2014-12-31 ENCOUNTER — Ambulatory Visit (HOSPITAL_COMMUNITY): Payer: Medicare Other | Admitting: Anesthesiology

## 2014-12-31 DIAGNOSIS — E039 Hypothyroidism, unspecified: Secondary | ICD-10-CM | POA: Insufficient documentation

## 2014-12-31 DIAGNOSIS — H25812 Combined forms of age-related cataract, left eye: Secondary | ICD-10-CM | POA: Insufficient documentation

## 2014-12-31 DIAGNOSIS — E119 Type 2 diabetes mellitus without complications: Secondary | ICD-10-CM | POA: Insufficient documentation

## 2014-12-31 DIAGNOSIS — K219 Gastro-esophageal reflux disease without esophagitis: Secondary | ICD-10-CM | POA: Diagnosis not present

## 2014-12-31 DIAGNOSIS — Z79899 Other long term (current) drug therapy: Secondary | ICD-10-CM | POA: Diagnosis not present

## 2014-12-31 DIAGNOSIS — H269 Unspecified cataract: Secondary | ICD-10-CM | POA: Diagnosis present

## 2014-12-31 DIAGNOSIS — I10 Essential (primary) hypertension: Secondary | ICD-10-CM | POA: Diagnosis not present

## 2014-12-31 HISTORY — PX: CATARACT EXTRACTION W/PHACO: SHX586

## 2014-12-31 LAB — GLUCOSE, CAPILLARY: Glucose-Capillary: 113 mg/dL — ABNORMAL HIGH (ref 65–99)

## 2014-12-31 SURGERY — PHACOEMULSIFICATION, CATARACT, WITH IOL INSERTION
Anesthesia: Monitor Anesthesia Care | Site: Eye | Laterality: Left

## 2014-12-31 MED ORDER — LIDOCAINE HCL (PF) 1 % IJ SOLN
INTRAMUSCULAR | Status: DC | PRN
  Administered 2014-12-31: .7 mL

## 2014-12-31 MED ORDER — CYCLOPENTOLATE-PHENYLEPHRINE 0.2-1 % OP SOLN
1.0000 [drp] | OPHTHALMIC | Status: AC
  Administered 2014-12-31 (×3): 1 [drp] via OPHTHALMIC

## 2014-12-31 MED ORDER — LIDOCAINE HCL 3.5 % OP GEL
1.0000 "application " | Freq: Once | OPHTHALMIC | Status: AC
  Administered 2014-12-31: 1 via OPHTHALMIC

## 2014-12-31 MED ORDER — EPINEPHRINE HCL 1 MG/ML IJ SOLN
INTRAMUSCULAR | Status: AC
  Filled 2014-12-31: qty 1

## 2014-12-31 MED ORDER — TETRACAINE HCL 0.5 % OP SOLN
1.0000 [drp] | OPHTHALMIC | Status: AC
  Administered 2014-12-31 (×3): 1 [drp] via OPHTHALMIC

## 2014-12-31 MED ORDER — POVIDONE-IODINE 5 % OP SOLN
OPHTHALMIC | Status: DC | PRN
  Administered 2014-12-31: 1 via OPHTHALMIC

## 2014-12-31 MED ORDER — MIDAZOLAM HCL 2 MG/2ML IJ SOLN
1.0000 mg | INTRAMUSCULAR | Status: DC | PRN
Start: 2014-12-31 — End: 2014-12-31
  Administered 2014-12-31: 2 mg via INTRAVENOUS

## 2014-12-31 MED ORDER — BSS IO SOLN
INTRAOCULAR | Status: DC | PRN
  Administered 2014-12-31: 15 mL

## 2014-12-31 MED ORDER — EPINEPHRINE HCL 1 MG/ML IJ SOLN
INTRAOCULAR | Status: DC | PRN
  Administered 2014-12-31: 500 mL

## 2014-12-31 MED ORDER — LACTATED RINGERS IV SOLN
INTRAVENOUS | Status: DC
  Administered 2014-12-31: 10:00:00 via INTRAVENOUS

## 2014-12-31 MED ORDER — MIDAZOLAM HCL 2 MG/2ML IJ SOLN
INTRAMUSCULAR | Status: AC
  Filled 2014-12-31: qty 2

## 2014-12-31 MED ORDER — PHENYLEPHRINE HCL 2.5 % OP SOLN
1.0000 [drp] | OPHTHALMIC | Status: AC
  Administered 2014-12-31 (×3): 1 [drp] via OPHTHALMIC

## 2014-12-31 MED ORDER — FENTANYL CITRATE (PF) 100 MCG/2ML IJ SOLN
25.0000 ug | Freq: Once | INTRAMUSCULAR | Status: AC
  Administered 2014-12-31: 25 ug via INTRAVENOUS

## 2014-12-31 MED ORDER — FENTANYL CITRATE (PF) 100 MCG/2ML IJ SOLN
INTRAMUSCULAR | Status: AC
Start: 2014-12-31 — End: 2014-12-31
  Filled 2014-12-31: qty 2

## 2014-12-31 MED ORDER — NEOMYCIN-POLYMYXIN-DEXAMETH 3.5-10000-0.1 OP SUSP
OPHTHALMIC | Status: DC | PRN
  Administered 2014-12-31: 2 [drp] via OPHTHALMIC

## 2014-12-31 MED ORDER — PROVISC 10 MG/ML IO SOLN
INTRAOCULAR | Status: DC | PRN
  Administered 2014-12-31: 0.85 mL via INTRAOCULAR

## 2014-12-31 SURGICAL SUPPLY — 10 items
CLOTH BEACON ORANGE TIMEOUT ST (SAFETY) ×3 IMPLANT
EYE SHIELD UNIVERSAL CLEAR (GAUZE/BANDAGES/DRESSINGS) ×3 IMPLANT
GLOVE BIOGEL PI IND STRL 7.0 (GLOVE) ×2 IMPLANT
GLOVE BIOGEL PI INDICATOR 7.0 (GLOVE) ×4
PAD ARMBOARD 7.5X6 YLW CONV (MISCELLANEOUS) ×3 IMPLANT
SIGHTPATH CAT PROC W REG LENS (Ophthalmic Related) ×3 IMPLANT
SYRINGE LUER LOK 1CC (MISCELLANEOUS) ×3 IMPLANT
TAPE SURG TRANSPORE 1 IN (GAUZE/BANDAGES/DRESSINGS) ×1 IMPLANT
TAPE SURGICAL TRANSPORE 1 IN (GAUZE/BANDAGES/DRESSINGS) ×2
WATER STERILE IRR 250ML POUR (IV SOLUTION) ×3 IMPLANT

## 2014-12-31 NOTE — Anesthesia Preprocedure Evaluation (Signed)
Anesthesia Evaluation  Patient identified by MRN, date of birth, ID band Patient awake    Reviewed: Allergy & Precautions, NPO status , Patient's Chart, lab work & pertinent test results, reviewed documented beta blocker date and time   Airway Mallampati: II  TM Distance: >3 FB     Dental  (+) Teeth Intact   Pulmonary neg pulmonary ROS,  breath sounds clear to auscultation        Cardiovascular hypertension, Pt. on medications and Pt. on home beta blockers Rhythm:Regular Rate:Normal     Neuro/Psych    GI/Hepatic GERD-  Controlled and Medicated,  Endo/Other  diabetes, Type 2Hypothyroidism   Renal/GU      Musculoskeletal   Abdominal   Peds  Hematology  (+) anemia ,   Anesthesia Other Findings   Reproductive/Obstetrics                             Anesthesia Physical Anesthesia Plan  ASA: III  Anesthesia Plan: MAC   Post-op Pain Management:    Induction: Intravenous  Airway Management Planned: Nasal Cannula  Additional Equipment:   Intra-op Plan:   Post-operative Plan:   Informed Consent: I have reviewed the patients History and Physical, chart, labs and discussed the procedure including the risks, benefits and alternatives for the proposed anesthesia with the patient or authorized representative who has indicated his/her understanding and acceptance.     Plan Discussed with:   Anesthesia Plan Comments:         Anesthesia Quick Evaluation

## 2014-12-31 NOTE — Op Note (Signed)
Date of Admission: 12/31/2014  Date of Surgery: 12/31/2014   Pre-Op Dx: Cataract Left Eye  Post-Op Dx: Senile Combined Cataract Left  Eye,  Dx Code R84.128  Surgeon: Tonny Branch, M.D.  Assistants: None  Anesthesia: Topical with MAC  Indications: Painless, progressive loss of vision with compromise of daily activities.  Surgery: Cataract Extraction with Intraocular lens Implant Left Eye  Discription: The patient had dilating drops and viscous lidocaine placed into the Left eye in the pre-op holding area. After transfer to the operating room, a time out was performed. The patient was then prepped and draped. Beginning with a 62 degree blade a paracentesis port was made at the surgeon's 2 o'clock position. The anterior chamber was then filled with 1% non-preserved lidocaine. This was followed by filling the anterior chamber with Provisc.  A 2.38mm keratome blade was used to make a clear corneal incision at the temporal limbus.  A bent cystatome needle was used to create a continuous tear capsulotomy. Hydrodissection was performed with balanced salt solution on a Fine canula. The lens nucleus was then removed using the phacoemulsification handpiece. Residual cortex was removed with the I&A handpiece. The anterior chamber and capsular bag were refilled with Provisc. A posterior chamber intraocular lens was placed into the capsular bag with it's injector. The implant was positioned with the Kuglan hook. The Provisc was then removed from the anterior chamber and capsular bag with the I&A handpiece. Stromal hydration of the main incision and paracentesis port was performed with BSS on a Fine canula. The wounds were tested for leak which was negative. The patient tolerated the procedure well. There were no operative complications. The patient was then transferred to the recovery room in stable condition.  Complications: None  Specimen: None  EBL: None  Prosthetic device: Hoya iSert 250, power 18.0 D, SN  [id number].

## 2014-12-31 NOTE — H&P (Signed)
I have reviewed the H&P, the patient was re-examined, and I have identified no interval changes in medical condition and plan of care since the history and physical of record  

## 2014-12-31 NOTE — Discharge Instructions (Signed)

## 2014-12-31 NOTE — Anesthesia Postprocedure Evaluation (Signed)
  Anesthesia Post-op Note  Patient: Rhonda Mejia  Procedure(s) Performed: Procedure(s) with comments: CATARACT EXTRACTION PHACO AND INTRAOCULAR LENS PLACEMENT (IOC) (Left) - CDE:8.45  Patient Location: Short Stay  Anesthesia Type:MAC  Level of Consciousness: awake, alert  and oriented  Airway and Oxygen Therapy: Patient Spontanous Breathing  Post-op Pain: none  Post-op Assessment: Post-op Vital signs reviewed, Patient's Cardiovascular Status Stable, Respiratory Function Stable, Patent Airway and No signs of Nausea or vomiting              Post-op Vital Signs: Reviewed and stable  Last Vitals:  Filed Vitals:   12/31/14 1015  BP: 152/63  Pulse:   Temp:   Resp: 15    Complications: No apparent anesthesia complications

## 2014-12-31 NOTE — Transfer of Care (Signed)
Immediate Anesthesia Transfer of Care Note  Patient: Rhonda Mejia  Procedure(s) Performed: Procedure(s) with comments: CATARACT EXTRACTION PHACO AND INTRAOCULAR LENS PLACEMENT (IOC) (Left) - CDE:8.45  Patient Location: Short Stay  Anesthesia Type:MAC  Level of Consciousness: awake  Airway & Oxygen Therapy: Patient Spontanous Breathing  Post-op Assessment: Report given to RN  Post vital signs: Reviewed  Last Vitals:  Filed Vitals:   12/31/14 1015  BP: 152/63  Pulse:   Temp:   Resp: 15    Complications: No apparent anesthesia complications

## 2015-01-01 ENCOUNTER — Encounter (HOSPITAL_COMMUNITY): Payer: Self-pay | Admitting: Ophthalmology

## 2015-01-22 ENCOUNTER — Encounter (HOSPITAL_COMMUNITY)
Admission: RE | Admit: 2015-01-22 | Discharge: 2015-01-22 | Disposition: A | Discharge status: Home / Self Care | Payer: Medicare Other | Source: Ambulatory Visit | Attending: Ophthalmology | Admitting: Ophthalmology

## 2015-01-22 NOTE — Patient Instructions (Signed)
Rhonda Mejia  01/22/2015     @PREFPERIOPPHARMACY @   Your procedure is scheduled on 01/28/2015.  Report to [facility] at 10:00 A.M.  Call this number if you have problems the morning of surgery:  [phone]   Remember:  Do not eat food or drink liquids after midnight.  Take these medicines the morning of surgery with A SIP OF WATER: Effexor, Norvasc, Allopurinol, Neurontin, Synthroid, Lisinopril, Toprol XL, Prilosec and Ultram   Do not wear jewelry, make-up or nail polish.  Do not wear lotions, powders, or perfumes.  You may wear deodorant.  Do not shave 48 hours prior to surgery.  Men may shave face and neck.  Do not bring valuables to the hospital.  [facility] is not responsible for any belongings or valuables.  Contacts, dentures or bridgework may not be worn into surgery.  Leave your suitcase in the car.  After surgery it may be brought to your room.  For patients admitted to the hospital, discharge time will be determined by your treatment team.  Patients discharged the day of surgery will not be allowed to drive home.   Name and phone number of your driver:   family Special instructions:   n/a  Please read over the following fact sheets that you were given. Care and Recovery After Surgery    Cataract Surgery  A cataract is a clouding of the lens of the eye. When a lens becomes cloudy, vision is reduced based on the degree and nature of the clouding. Surgery may be needed to improve vision. Surgery removes the cloudy lens and usually replaces it with a substitute lens (intraocular lens, IOL). LET YOUR EYE DOCTOR KNOW ABOUT:  Allergies to food or medicine.  Medicines taken including herbs, eye drops, over-the-counter medicines, and creams.  Use of steroids (by mouth or creams).  Previous problems with anesthetics or numbing medicine.  History of bleeding problems or blood clots.  Previous surgery.  Other health problems, including diabetes and kidney  problems.  Possibility of pregnancy, if this applies. RISKS AND COMPLICATIONS  Infection.  Inflammation of the eyeball (endophthalmitis) that can spread to both eyes (sympathetic ophthalmia).  Poor wound healing.  If an IOL is inserted, it can later fall out of proper position. This is very uncommon.  Clouding of the part of your eye that holds an IOL in place. This is called an "after-cataract." These are uncommon but easily treated. BEFORE THE PROCEDURE  Do not eat or drink anything except small amounts of water for 8 to 12 before your surgery, or as directed by your caregiver.  Unless you are told otherwise, continue any eye drops you have been prescribed.  Talk to your primary caregiver about all other medicines that you take (both prescription and nonprescription). In some cases, you may need to stop or change medicines near the time of your surgery. This is most important if you are taking blood-thinning medicine.Do not stop medicines unless you are told to do so.  Arrange for someone to drive you to and from the procedure.  Do not put contact lenses in either eye on the day of your surgery. PROCEDURE There is more than one method for safely removing a cataract. Your doctor can explain the differences and help determine which is best for you. Phacoemulsification surgery is the most common form of cataract surgery.  An injection is given behind the eye or eye drops are given to make this a painless procedure.  A small  cut (incision) is made on the edge of the clear, dome-shaped surface that covers the front of the eye (cornea).  A tiny probe is painlessly inserted into the eye. This device gives off ultrasound waves that soften and break up the cloudy center of the lens. This makes it easier for the cloudy lens to be removed by suction.  An IOL may be implanted.  The normal lens of the eye is covered by a clear capsule. Part of that capsule is intentionally left in the eye  to support the IOL.  Your surgeon may or may not use stitches to close the incision. There are other forms of cataract surgery that require a larger incision and stitches to close the eye. This approach is taken in cases where the doctor feels that the cataract cannot be easily removed using phacoemulsification. AFTER THE PROCEDURE  When an IOL is implanted, it does not need care. It becomes a permanent part of your eye and cannot be seen or felt.  Your doctor will schedule follow-up exams to check on your progress.  Review your other medicines with your doctor to see which can be resumed after surgery.  Use eye drops or take medicine as prescribed by your doctor. Document Released: 06/25/2011 Document Revised: 11/20/2013 Document Reviewed: 06/25/2011 [facility] Patient Information 2015 [place], [place]. This information is not intended to replace advice given to you by your health care provider. Make sure you discuss any questions you have with your health care provider.

## 2015-01-25 MED ORDER — PHENYLEPHRINE HCL 2.5 % OP SOLN
OPHTHALMIC | Status: AC
  Filled 2015-01-25: qty 15

## 2015-01-25 MED ORDER — LIDOCAINE HCL 3.5 % OP GEL
OPHTHALMIC | Status: AC
  Filled 2015-01-25: qty 1

## 2015-01-25 MED ORDER — LIDOCAINE HCL (PF) 1 % IJ SOLN
INTRAMUSCULAR | Status: AC
Start: 2015-01-25 — End: 2015-01-25
  Filled 2015-01-25: qty 2

## 2015-01-25 MED ORDER — TETRACAINE HCL 0.5 % OP SOLN
OPHTHALMIC | Status: AC
  Filled 2015-01-25: qty 2

## 2015-01-25 MED ORDER — CYCLOPENTOLATE-PHENYLEPHRINE OP SOLN OPTIME - NO CHARGE
OPHTHALMIC | Status: AC
  Filled 2015-01-25: qty 2

## 2015-01-25 MED ORDER — NEOMYCIN-POLYMYXIN-DEXAMETH 3.5-10000-0.1 OP SUSP
OPHTHALMIC | Status: AC
  Filled 2015-01-25: qty 5

## 2015-01-28 ENCOUNTER — Encounter (HOSPITAL_COMMUNITY): Payer: Self-pay | Admitting: *Deleted

## 2015-01-28 ENCOUNTER — Ambulatory Visit (HOSPITAL_COMMUNITY): Payer: Medicare Other | Admitting: Anesthesiology

## 2015-01-28 ENCOUNTER — Ambulatory Visit (HOSPITAL_COMMUNITY)
Admission: RE | Admit: 2015-01-28 | Discharge: 2015-01-28 | Disposition: A | Discharge status: Home / Self Care | Payer: Medicare Other | Source: Ambulatory Visit | Attending: Ophthalmology | Admitting: Ophthalmology

## 2015-01-28 ENCOUNTER — Encounter (HOSPITAL_COMMUNITY)
Admission: RE | Disposition: A | Discharge status: Home / Self Care | Payer: Self-pay | Source: Ambulatory Visit | Attending: Ophthalmology

## 2015-01-28 DIAGNOSIS — Z882 Allergy status to sulfonamides status: Secondary | ICD-10-CM | POA: Insufficient documentation

## 2015-01-28 DIAGNOSIS — E119 Type 2 diabetes mellitus without complications: Secondary | ICD-10-CM | POA: Insufficient documentation

## 2015-01-28 DIAGNOSIS — H25811 Combined forms of age-related cataract, right eye: Secondary | ICD-10-CM | POA: Insufficient documentation

## 2015-01-28 DIAGNOSIS — I10 Essential (primary) hypertension: Secondary | ICD-10-CM | POA: Diagnosis not present

## 2015-01-28 DIAGNOSIS — D649 Anemia, unspecified: Secondary | ICD-10-CM | POA: Insufficient documentation

## 2015-01-28 DIAGNOSIS — Z880 Allergy status to penicillin: Secondary | ICD-10-CM | POA: Insufficient documentation

## 2015-01-28 DIAGNOSIS — K219 Gastro-esophageal reflux disease without esophagitis: Secondary | ICD-10-CM | POA: Diagnosis not present

## 2015-01-28 DIAGNOSIS — E039 Hypothyroidism, unspecified: Secondary | ICD-10-CM | POA: Diagnosis not present

## 2015-01-28 HISTORY — PX: CATARACT EXTRACTION W/PHACO: SHX586

## 2015-01-28 LAB — GLUCOSE, CAPILLARY: Glucose-Capillary: 106 mg/dL — ABNORMAL HIGH (ref 65–99)

## 2015-01-28 SURGERY — PHACOEMULSIFICATION, CATARACT, WITH IOL INSERTION
Anesthesia: Monitor Anesthesia Care | Site: Eye | Laterality: Right

## 2015-01-28 MED ORDER — LIDOCAINE HCL 3.5 % OP GEL
1.0000 "application " | Freq: Once | OPHTHALMIC | Status: AC
  Administered 2015-01-28: 1 via OPHTHALMIC

## 2015-01-28 MED ORDER — EPINEPHRINE HCL 1 MG/ML IJ SOLN
INTRAMUSCULAR | Status: AC
  Filled 2015-01-28: qty 1

## 2015-01-28 MED ORDER — LIDOCAINE HCL (PF) 1 % IJ SOLN
INTRAMUSCULAR | Status: DC | PRN
  Administered 2015-01-28: .5 mL

## 2015-01-28 MED ORDER — PHENYLEPHRINE HCL 2.5 % OP SOLN
1.0000 [drp] | OPHTHALMIC | Status: AC
  Administered 2015-01-28 (×3): 1 [drp] via OPHTHALMIC

## 2015-01-28 MED ORDER — EPINEPHRINE HCL 1 MG/ML IJ SOLN
INTRAOCULAR | Status: DC | PRN
  Administered 2015-01-28: 10:00:00

## 2015-01-28 MED ORDER — PROVISC 10 MG/ML IO SOLN
INTRAOCULAR | Status: DC | PRN
  Administered 2015-01-28: 0.85 mL via INTRAOCULAR

## 2015-01-28 MED ORDER — MIDAZOLAM HCL 2 MG/2ML IJ SOLN
INTRAMUSCULAR | Status: AC
  Filled 2015-01-28: qty 2

## 2015-01-28 MED ORDER — MIDAZOLAM HCL 2 MG/2ML IJ SOLN
1.0000 mg | INTRAMUSCULAR | Status: DC | PRN
  Administered 2015-01-28: 2 mg via INTRAVENOUS

## 2015-01-28 MED ORDER — FENTANYL CITRATE (PF) 100 MCG/2ML IJ SOLN
25.0000 ug | INTRAMUSCULAR | Status: AC
  Administered 2015-01-28 (×2): 25 ug via INTRAVENOUS

## 2015-01-28 MED ORDER — BSS IO SOLN
INTRAOCULAR | Status: DC | PRN
  Administered 2015-01-28: 15 mL via INTRAOCULAR

## 2015-01-28 MED ORDER — POVIDONE-IODINE 5 % OP SOLN
OPHTHALMIC | Status: DC | PRN
  Administered 2015-01-28: 1 via OPHTHALMIC

## 2015-01-28 MED ORDER — LACTATED RINGERS IV SOLN
INTRAVENOUS | Status: DC
  Administered 2015-01-28: 10:00:00 via INTRAVENOUS

## 2015-01-28 MED ORDER — CYCLOPENTOLATE-PHENYLEPHRINE 0.2-1 % OP SOLN
1.0000 [drp] | OPHTHALMIC | Status: AC
  Administered 2015-01-28 (×3): 1 [drp] via OPHTHALMIC

## 2015-01-28 MED ORDER — NEOMYCIN-POLYMYXIN-DEXAMETH 3.5-10000-0.1 OP SUSP
OPHTHALMIC | Status: DC | PRN
  Administered 2015-01-28: 1 [drp] via OPHTHALMIC

## 2015-01-28 MED ORDER — FENTANYL CITRATE (PF) 100 MCG/2ML IJ SOLN
INTRAMUSCULAR | Status: AC
  Filled 2015-01-28: qty 2

## 2015-01-28 MED ORDER — TETRACAINE HCL 0.5 % OP SOLN
1.0000 [drp] | OPHTHALMIC | Status: AC
  Administered 2015-01-28 (×3): 1 [drp] via OPHTHALMIC

## 2015-01-28 SURGICAL SUPPLY — 34 items
CAPSULAR TENSION RING-AMO (OPHTHALMIC RELATED) IMPLANT
CLOTH BEACON ORANGE TIMEOUT ST (SAFETY) ×3 IMPLANT
EYE SHIELD UNIVERSAL CLEAR (GAUZE/BANDAGES/DRESSINGS) ×3 IMPLANT
GLOVE BIO SURGEON STRL SZ 6.5 (GLOVE) IMPLANT
GLOVE BIO SURGEONS STRL SZ 6.5 (GLOVE)
GLOVE BIOGEL PI IND STRL 6.5 (GLOVE) ×1 IMPLANT
GLOVE BIOGEL PI IND STRL 7.0 (GLOVE) ×1 IMPLANT
GLOVE BIOGEL PI IND STRL 7.5 (GLOVE) IMPLANT
GLOVE BIOGEL PI INDICATOR 6.5 (GLOVE) ×2
GLOVE BIOGEL PI INDICATOR 7.0 (GLOVE) ×2
GLOVE BIOGEL PI INDICATOR 7.5 (GLOVE)
GLOVE ECLIPSE 6.5 STRL STRAW (GLOVE) IMPLANT
GLOVE ECLIPSE 7.0 STRL STRAW (GLOVE) IMPLANT
GLOVE ECLIPSE 7.5 STRL STRAW (GLOVE) IMPLANT
GLOVE EXAM NITRILE LRG STRL (GLOVE) IMPLANT
GLOVE EXAM NITRILE MD LF STRL (GLOVE) IMPLANT
GLOVE SKINSENSE NS SZ6.5 (GLOVE)
GLOVE SKINSENSE NS SZ7.0 (GLOVE)
GLOVE SKINSENSE STRL SZ6.5 (GLOVE) IMPLANT
GLOVE SKINSENSE STRL SZ7.0 (GLOVE) IMPLANT
KIT VITRECTOMY (OPHTHALMIC RELATED) IMPLANT
PAD ARMBOARD 7.5X6 YLW CONV (MISCELLANEOUS) ×3 IMPLANT
PROC W NO LENS (INTRAOCULAR LENS)
PROC W SPEC LENS (INTRAOCULAR LENS)
PROCESS W NO LENS (INTRAOCULAR LENS) IMPLANT
PROCESS W SPEC LENS (INTRAOCULAR LENS) IMPLANT
RETRACTOR IRIS SIGHTPATH (OPHTHALMIC RELATED) IMPLANT
RING MALYGIN (MISCELLANEOUS) IMPLANT
SIGHTPATH CAT PROC W REG LENS (Ophthalmic Related) ×3 IMPLANT
SYRINGE LUER LOK 1CC (MISCELLANEOUS) ×3 IMPLANT
TAPE SURG TRANSPARENT 2IN (GAUZE/BANDAGES/DRESSINGS) ×1 IMPLANT
TAPE TRANSPARENT 2IN (GAUZE/BANDAGES/DRESSINGS) ×2
VISCOELASTIC ADDITIONAL (OPHTHALMIC RELATED) IMPLANT
WATER STERILE IRR 250ML POUR (IV SOLUTION) ×3 IMPLANT

## 2015-01-28 NOTE — Op Note (Signed)
Date of Admission: 01/28/2015  Date of Surgery: 01/28/2015   Pre-Op Dx: Cataract Right Eye  Post-Op Dx: Senile Combined Cataract Right  Eye,  Dx Code E01.007  Surgeon: Tonny Branch, M.D.  Assistants: None  Anesthesia: Topical with MAC  Indications: Painless, progressive loss of vision with compromise of daily activities.  Surgery: Cataract Extraction with Intraocular lens Implant Right Eye  Discription: The patient had dilating drops and viscous lidocaine placed into the Right eye in the pre-op holding area. After transfer to the operating room, a time out was performed. The patient was then prepped and draped. Beginning with a 48 degree blade a paracentesis port was made at the surgeon's 2 o'clock position. The anterior chamber was then filled with 1% non-preserved lidocaine. This was followed by filling the anterior chamber with Provisc.  A 2.80mm keratome blade was used to make a clear corneal incision at the temporal limbus.  A bent cystatome needle was used to create a continuous tear capsulotomy. Hydrodissection was performed with balanced salt solution on a Fine canula. The lens nucleus was then removed using the phacoemulsification handpiece. Residual cortex was removed with the I&A handpiece. The anterior chamber and capsular bag were refilled with Provisc. A posterior chamber intraocular lens was placed into the capsular bag with it's injector. The implant was positioned with the Kuglan hook. The Provisc was then removed from the anterior chamber and capsular bag with the I&A handpiece. Stromal hydration of the main incision and paracentesis port was performed with BSS on a Fine canula. The wounds were tested for leak which was negative. The patient tolerated the procedure well. There were no operative complications. The patient was then transferred to the recovery room in stable condition.  Complications: None  Specimen: None  EBL: None  Prosthetic device: Hoya iSert 250, power 18.0  D, SN [id number].

## 2015-01-28 NOTE — Discharge Instructions (Signed)

## 2015-01-28 NOTE — H&P (Signed)
I have reviewed the H&P, the patient was re-examined, and I have identified no interval changes in medical condition and plan of care since the history and physical of record  

## 2015-01-28 NOTE — Anesthesia Preprocedure Evaluation (Signed)
Anesthesia Evaluation  Patient identified by MRN, date of birth, ID band Patient awake    Reviewed: Allergy & Precautions, NPO status , Patient's Chart, lab work & pertinent test results, reviewed documented beta blocker date and time   Airway Mallampati: II  TM Distance: >3 FB     Dental  (+) Teeth Intact   Pulmonary neg pulmonary ROS,  breath sounds clear to auscultation        Cardiovascular hypertension, Pt. on medications and Pt. on home beta blockers Rhythm:Regular Rate:Normal     Neuro/Psych    GI/Hepatic GERD-  Controlled and Medicated,  Endo/Other  diabetes, Type 2Hypothyroidism   Renal/GU      Musculoskeletal   Abdominal   Peds  Hematology  (+) anemia ,   Anesthesia Other Findings   Reproductive/Obstetrics                             Anesthesia Physical Anesthesia Plan  ASA: III  Anesthesia Plan: MAC   Post-op Pain Management:    Induction: Intravenous  Airway Management Planned: Nasal Cannula  Additional Equipment:   Intra-op Plan:   Post-operative Plan:   Informed Consent: I have reviewed the patients History and Physical, chart, labs and discussed the procedure including the risks, benefits and alternatives for the proposed anesthesia with the patient or authorized representative who has indicated his/her understanding and acceptance.     Plan Discussed with:   Anesthesia Plan Comments:         Anesthesia Quick Evaluation

## 2015-01-28 NOTE — Anesthesia Postprocedure Evaluation (Signed)
  Anesthesia Post-op Note  Patient: Rhonda Mejia  Procedure(s) Performed: Procedure(s) with comments: CATARACT EXTRACTION PHACO AND INTRAOCULAR LENS PLACEMENT RIGHT EYE (Right) - CDE:4.92  Patient Location: Short Stay  Anesthesia Type:MAC  Level of Consciousness: awake, alert  and oriented  Airway and Oxygen Therapy: Patient Spontanous Breathing  Post-op Pain: none  Post-op Assessment: Post-op Vital signs reviewed, Patient's Cardiovascular Status Stable, Respiratory Function Stable, Patent Airway and No signs of Nausea or vomiting              Post-op Vital Signs: Reviewed and stable  Last Vitals:  Filed Vitals:   01/28/15 0955  BP: 143/87  Pulse:   Temp:   Resp: 22    Complications: No apparent anesthesia complications

## 2015-01-28 NOTE — Transfer of Care (Signed)
Immediate Anesthesia Transfer of Care Note  Patient: Rhonda Mejia  Procedure(s) Performed: Procedure(s) with comments: CATARACT EXTRACTION PHACO AND INTRAOCULAR LENS PLACEMENT RIGHT EYE (Right) - CDE:4.92  Patient Location: Short Stay  Anesthesia Type:MAC  Level of Consciousness: awake  Airway & Oxygen Therapy: Patient Spontanous Breathing  Post-op Assessment: Report given to RN  Post vital signs: Reviewed  Last Vitals:  Filed Vitals:   01/28/15 0955  BP: 143/87  Pulse:   Temp:   Resp: 22    Complications: No apparent anesthesia complications

## 2015-01-28 NOTE — Addendum Note (Signed)
Addendum  created 01/28/15 1051 by Ollen Bowl, CRNA   Modules edited: Anesthesia Responsible Staff

## 2015-01-29 ENCOUNTER — Encounter (HOSPITAL_COMMUNITY): Payer: Self-pay | Admitting: Ophthalmology

## 2015-05-30 ENCOUNTER — Ambulatory Visit (INDEPENDENT_AMBULATORY_CARE_PROVIDER_SITE_OTHER): Payer: Medicare Other | Admitting: Otolaryngology

## 2015-05-30 DIAGNOSIS — D440 Neoplasm of uncertain behavior of thyroid gland: Secondary | ICD-10-CM

## 2015-07-23 DIAGNOSIS — M5126 Other intervertebral disc displacement, lumbar region: Secondary | ICD-10-CM | POA: Diagnosis not present

## 2015-08-08 DIAGNOSIS — M5126 Other intervertebral disc displacement, lumbar region: Secondary | ICD-10-CM | POA: Diagnosis not present

## 2015-08-08 DIAGNOSIS — M47816 Spondylosis without myelopathy or radiculopathy, lumbar region: Secondary | ICD-10-CM | POA: Diagnosis not present

## 2015-08-14 DIAGNOSIS — Z981 Arthrodesis status: Secondary | ICD-10-CM | POA: Diagnosis not present

## 2015-08-14 DIAGNOSIS — M5125 Other intervertebral disc displacement, thoracolumbar region: Secondary | ICD-10-CM | POA: Diagnosis not present

## 2015-08-14 DIAGNOSIS — M4806 Spinal stenosis, lumbar region: Secondary | ICD-10-CM | POA: Diagnosis not present

## 2015-08-14 DIAGNOSIS — M47816 Spondylosis without myelopathy or radiculopathy, lumbar region: Secondary | ICD-10-CM | POA: Diagnosis not present

## 2015-08-14 DIAGNOSIS — M5134 Other intervertebral disc degeneration, thoracic region: Secondary | ICD-10-CM | POA: Diagnosis not present

## 2015-08-19 DIAGNOSIS — H04123 Dry eye syndrome of bilateral lacrimal glands: Secondary | ICD-10-CM | POA: Diagnosis not present

## 2015-08-19 DIAGNOSIS — Z961 Presence of intraocular lens: Secondary | ICD-10-CM | POA: Diagnosis not present

## 2015-08-19 DIAGNOSIS — H02055 Trichiasis without entropian left lower eyelid: Secondary | ICD-10-CM | POA: Diagnosis not present

## 2015-08-28 DIAGNOSIS — Z789 Other specified health status: Secondary | ICD-10-CM | POA: Diagnosis not present

## 2015-08-28 DIAGNOSIS — M549 Dorsalgia, unspecified: Secondary | ICD-10-CM | POA: Diagnosis not present

## 2015-08-28 DIAGNOSIS — E2839 Other primary ovarian failure: Secondary | ICD-10-CM | POA: Diagnosis not present

## 2015-08-28 DIAGNOSIS — Z6835 Body mass index (BMI) 35.0-35.9, adult: Secondary | ICD-10-CM | POA: Diagnosis not present

## 2015-08-28 DIAGNOSIS — M545 Low back pain: Secondary | ICD-10-CM | POA: Diagnosis not present

## 2015-09-17 DIAGNOSIS — M47816 Spondylosis without myelopathy or radiculopathy, lumbar region: Secondary | ICD-10-CM | POA: Diagnosis not present

## 2015-09-17 DIAGNOSIS — M5126 Other intervertebral disc displacement, lumbar region: Secondary | ICD-10-CM | POA: Diagnosis not present

## 2015-09-25 DIAGNOSIS — M1711 Unilateral primary osteoarthritis, right knee: Secondary | ICD-10-CM | POA: Diagnosis not present

## 2015-09-26 DIAGNOSIS — Z299 Encounter for prophylactic measures, unspecified: Secondary | ICD-10-CM | POA: Diagnosis not present

## 2015-09-26 DIAGNOSIS — R609 Edema, unspecified: Secondary | ICD-10-CM | POA: Diagnosis not present

## 2015-09-26 DIAGNOSIS — I10 Essential (primary) hypertension: Secondary | ICD-10-CM | POA: Diagnosis not present

## 2015-09-26 DIAGNOSIS — Z789 Other specified health status: Secondary | ICD-10-CM | POA: Diagnosis not present

## 2015-10-03 ENCOUNTER — Encounter: Payer: Self-pay | Admitting: Cardiology

## 2015-10-03 ENCOUNTER — Encounter: Payer: Self-pay | Admitting: *Deleted

## 2015-10-03 ENCOUNTER — Ambulatory Visit (INDEPENDENT_AMBULATORY_CARE_PROVIDER_SITE_OTHER): Payer: Medicare Other | Admitting: Cardiology

## 2015-10-03 VITALS — BP 131/70 | HR 71 | Ht 66.0 in | Wt 212.0 lb

## 2015-10-03 DIAGNOSIS — R0989 Other specified symptoms and signs involving the circulatory and respiratory systems: Secondary | ICD-10-CM

## 2015-10-03 DIAGNOSIS — R600 Localized edema: Secondary | ICD-10-CM

## 2015-10-03 DIAGNOSIS — R0602 Shortness of breath: Secondary | ICD-10-CM | POA: Diagnosis not present

## 2015-10-03 DIAGNOSIS — I10 Essential (primary) hypertension: Secondary | ICD-10-CM

## 2015-10-03 MED ORDER — FUROSEMIDE 40 MG PO TABS: 40.0000 mg | ORAL_TABLET | Freq: Every day | ORAL | Status: DC

## 2015-10-03 NOTE — Patient Instructions (Signed)
Your physician recommends that you schedule a follow-up appointment in: [address] [place]  Your physician has recommended you make the following change in your medication:   STOP AMLODIPINE   TAKE LASIX 40 MG DAILY - NEW RX SENT TO PHARMACY  Your physician has requested that you have an echocardiogram. Echocardiography is a painless test that uses sound waves to create images of your heart. It provides your doctor with information about the size and shape of your heart and how well your heart's chambers and valves are working. This procedure takes approximately one hour. There are no restrictions for this procedure.  Your physician has requested that you have a carotid duplex. This test is an ultrasound of the carotid arteries in your neck. It looks at blood flow through these arteries that supply the brain with blood. Allow one hour for this exam. There are no restrictions or special instructions.  Your physician has requested that you regularly monitor and record your blood pressure readings at home FOR 2 WEEKS. Please use the same machine at the same time of day to check your readings and record them to bring to your follow-up visit.  Your physician recommends that you return for lab work in: 2 WEEKS BMP.TSH.MG  Thank you for choosing [place]!!

## 2015-10-03 NOTE — Progress Notes (Signed)
Patient ID: Rhonda Mejia, female   DOB: 05-Sep-1928, 82 y.o.   MRN: [id number]     Clinical Summary Rhonda Mejia is a 82 y.o.female seen today as a new patient for the following medical problems. She is referred by Dr Monico Blitz.   1. HTN - compliant with meds - does not check regularly at home  2. LE edema - echo 02/2015 difficult study ,with LVEF 50-55%, normal RV function. Diastolic function not described.  - ongoing for several years, bilateral swelling that has progressed recently - denies any SOB/DOE, though exertion limited due to chronic leg pain - no orthopnea, no PND. Can have some abdominal bloating at times.  - has been on lasix for several years, recently increased to 40mg  daily. Weights at home 210 lbs, just started checking regularly last week.   Past Medical History  Diagnosis Date  . Family hx of colon cancer   . Hypertension     for over 20 yrs  . High cholesterol   . Hypothyroid   . GERD (gastroesophageal reflux disease)   . Pill dysphagia   . Diabetes mellitus without complication     diet controlled  . Neuropathy   . History of gout   . Cancer     left breast cancer     Allergies  Allergen Reactions  . Penicillins     Rash   . Sulfa Antibiotics     Unknown reaction      Current Outpatient Prescriptions  Medication Sig Dispense Refill  . acetaminophen (TYLENOL) 650 MG CR tablet Take 650 mg by mouth every 8 (eight) hours as needed for pain.    Marland Kitchen allopurinol (ZYLOPRIM) 300 MG tablet Take 300 mg by mouth daily.    Marland Kitchen amLODipine (NORVASC) 5 MG tablet Take 5 mg by mouth daily.    Marland Kitchen docusate sodium (COLACE) 100 MG capsule Take 100 mg by mouth daily as needed for mild constipation.    . folic acid (FOLVITE) 1 MG tablet Take 1 mg by mouth daily.    . furosemide (LASIX) 20 MG tablet Take 20 mg by mouth daily.  4  . gabapentin (NEURONTIN) 300 MG capsule Take 300 mg by mouth 2 (two) times daily.  2  . levothyroxine (SYNTHROID, LEVOTHROID) 100 MCG  tablet Take 100 mcg by mouth daily.    Marland Kitchen lisinopril (PRINIVIL,ZESTRIL) 40 MG tablet Take 40 mg by mouth daily.    . metoprolol succinate (TOPROL-XL) 50 MG 24 hr tablet Take 50 mg by mouth daily.  6  . omeprazole (PRILOSEC) 40 MG capsule Take 40 mg by mouth daily.    . potassium chloride (K-DUR,KLOR-CON) 10 MEQ tablet Take 10 mEq by mouth daily.  6  . pyridOXINE (VITAMIN B-6) 100 MG tablet Take 100 mg by mouth daily.    Marland Kitchen senna (SENOKOT) 8.6 MG tablet Take 1 tablet by mouth as needed for constipation.    . terbinafine (LAMISIL) 250 MG tablet Take 250 mg by mouth daily.    . traMADol (ULTRAM) 50 MG tablet Take 50 mg by mouth every 6 (six) hours as needed for pain.    Marland Kitchen venlafaxine (EFFEXOR) 75 MG tablet Take 75 mg by mouth 2 (two) times daily.    . vitamin B-12 (CYANOCOBALAMIN) 1000 MCG tablet Take 1,000 mcg by mouth daily. 5,000 daily     No current facility-administered medications for this visit.     Past Surgical History  Procedure Laterality Date  . Breast lumpectomy  left breast  . Knee surgery Right     arthroscopy x2  . Cholecystectomy    . Colonoscopy N/A 10/21/2012    Procedure: COLONOSCOPY;  Surgeon: Rogene Houston, MD;  Location: [facility];  Service: Endoscopy;  Laterality: N/A;  . Esophagogastroduodenoscopy (egd) with esophageal dilation N/A 06/09/2013    Procedure: ESOPHAGOGASTRODUODENOSCOPY (EGD) WITH ESOPHAGEAL DILATION;  Surgeon: Rogene Houston, MD;  Location: [facility];  Service: Endoscopy;  Laterality: N/A;  125  . Back surgery      5 rods and 5 screws and cage in back, has had 4 back surgeries.  . Cataract extraction w/phaco Left 12/31/2014    Procedure: CATARACT EXTRACTION PHACO AND INTRAOCULAR LENS PLACEMENT (IOC);  Surgeon: Tonny Myrikal Messmer, MD;  Location: [facility];  Service: Ophthalmology;  Laterality: Left;  CDE:8.45  . Cataract extraction w/phaco Right 01/28/2015    Procedure: CATARACT EXTRACTION PHACO AND INTRAOCULAR LENS PLACEMENT RIGHT EYE;  Surgeon: Tonny Nikaela Coyne, MD;  Location: [facility];  Service: Ophthalmology;  Laterality: Right;  CDE:4.92     Allergies  Allergen Reactions  . Penicillins     Rash   . Sulfa Antibiotics     Unknown reaction       Family History  Problem Relation Age of Onset  . Colon cancer Brother      Social History Rhonda Mejia reports that she has never smoked. She does not have any smokeless tobacco history on file. Rhonda Mejia reports that she does not drink alcohol.   Review of Systems CONSTITUTIONAL: No weight loss, fever, chills, weakness or fatigue.  HEENT: Eyes: No visual loss, blurred vision, double vision or yellow sclerae.No hearing loss, sneezing, congestion, runny nose or sore throat.  SKIN: No rash or itching.  CARDIOVASCULAR: per HPI RESPIRATORY: +SOB GASTROINTESTINAL: No anorexia, nausea, vomiting or diarrhea. No abdominal pain or blood.  GENITOURINARY: No burning on urination, no polyuria NEUROLOGICAL: No headache, dizziness, syncope, paralysis, ataxia, numbness or tingling in the extremities. No change in bowel or bladder control.  MUSCULOSKELETAL: No muscle, back pain, joint pain or stiffness.  LYMPHATICS: No enlarged nodes. No history of splenectomy.  PSYCHIATRIC: No history of depression or anxiety.  ENDOCRINOLOGIC: No reports of sweating, cold or heat intolerance. No polyuria or polydipsia.  Marland Kitchen   Physical Examination Filed Vitals:   10/03/15 0835  BP: 131/70  Pulse: 71   Filed Vitals:   10/03/15 0835  Height: 5\' 6"  (1.676 m)  Weight: 212 lb (96.163 kg)    Gen: resting comfortably, no acute distress HEENT: no scleral icterus, pupils equal round and reactive, no palptable cervical adenopathy,  CV: RRR, 2/6 systolic murmr at apex no jvd. Bilateral carotid bruits Resp: Clear to auscultation bilaterally GI: abdomen is soft, non-tender, non-distended, normal bowel sounds, no hepatosplenomegaly MSK: extremities are warm, no edema.  Skin: warm, no rash Neuro:  no focal  deficits Psych: appropriate affect  10/03/15 Clinic EKG (performed and reviewed in clinic): SR, PACs, LAFB, nonspecific conduction delay  Assessment and Plan  1. HTN  - at goal, with LE edema will stop norvasc and have her submit bp log in 2 weeks.   2. LE edema - will repeat echo w/ contrast, previous study with reported limited visualization, diastolic function and RV function not well described - check BMET, Mg, TSH - increase lasix to 40mg  daily.   3. Carotid bruits - order carotid US   F/u 4 weeks   Arnoldo Lenis, M.D.

## 2015-10-08 ENCOUNTER — Other Ambulatory Visit: Payer: Self-pay | Admitting: Cardiology

## 2015-10-08 DIAGNOSIS — I10 Essential (primary) hypertension: Secondary | ICD-10-CM | POA: Diagnosis not present

## 2015-10-08 DIAGNOSIS — E1142 Type 2 diabetes mellitus with diabetic polyneuropathy: Secondary | ICD-10-CM | POA: Diagnosis not present

## 2015-10-08 DIAGNOSIS — N183 Chronic kidney disease, stage 3 (moderate): Secondary | ICD-10-CM | POA: Diagnosis not present

## 2015-10-08 DIAGNOSIS — Z299 Encounter for prophylactic measures, unspecified: Secondary | ICD-10-CM | POA: Diagnosis not present

## 2015-10-09 ENCOUNTER — Ambulatory Visit: Payer: Medicare Other

## 2015-10-09 DIAGNOSIS — I6523 Occlusion and stenosis of bilateral carotid arteries: Secondary | ICD-10-CM | POA: Diagnosis not present

## 2015-10-09 DIAGNOSIS — R0989 Other specified symptoms and signs involving the circulatory and respiratory systems: Secondary | ICD-10-CM

## 2015-10-11 ENCOUNTER — Ambulatory Visit (HOSPITAL_COMMUNITY)
Admission: RE | Admit: 2015-10-11 | Discharge: 2015-10-11 | Disposition: A | Discharge status: Home / Self Care | Payer: Medicare Other | Source: Ambulatory Visit | Attending: Cardiology | Admitting: Cardiology

## 2015-10-11 DIAGNOSIS — I10 Essential (primary) hypertension: Secondary | ICD-10-CM | POA: Insufficient documentation

## 2015-10-11 DIAGNOSIS — R0602 Shortness of breath: Secondary | ICD-10-CM

## 2015-10-11 DIAGNOSIS — E785 Hyperlipidemia, unspecified: Secondary | ICD-10-CM | POA: Diagnosis not present

## 2015-10-14 ENCOUNTER — Telehealth: Payer: Self-pay | Admitting: *Deleted

## 2015-10-14 NOTE — Telephone Encounter (Signed)
Pt says swelling in much better since increase of lasix.

## 2015-10-14 NOTE — Telephone Encounter (Signed)
Pt aware, routed to pcp 

## 2015-10-14 NOTE — Telephone Encounter (Signed)
-----   Message from Arnoldo Lenis, MD sent at 10/14/2015 12:39 PM EDT ----- Echo shows heart pumping function is normal, there is some age related stiffness to the heart that likely is causing her swelling. How is her swelling doing?  J BrancH MD

## 2015-10-14 NOTE — Telephone Encounter (Signed)
-----   Message from Arnoldo Lenis, MD sent at 10/14/2015 12:41 PM EDT ----- Carotid US shows a moderate blockage on the left, we will discuss at our follow up further but likely we will need to do a CT scan of this area to get more information about its significance.  Zandra Abts MD

## 2015-10-14 NOTE — Telephone Encounter (Signed)
-----   Message from Arnoldo Lenis, MD sent at 10/14/2015 12:38 PM EDT ----- Labs look good  Zandra Abts MD

## 2015-10-16 DIAGNOSIS — E119 Type 2 diabetes mellitus without complications: Secondary | ICD-10-CM | POA: Diagnosis not present

## 2015-10-16 DIAGNOSIS — I10 Essential (primary) hypertension: Secondary | ICD-10-CM | POA: Diagnosis not present

## 2015-10-22 ENCOUNTER — Ambulatory Visit: Payer: Medicare Other | Admitting: Cardiology

## 2015-10-29 DIAGNOSIS — M1990 Unspecified osteoarthritis, unspecified site: Secondary | ICD-10-CM | POA: Diagnosis not present

## 2015-10-29 DIAGNOSIS — J0100 Acute maxillary sinusitis, unspecified: Secondary | ICD-10-CM | POA: Diagnosis not present

## 2015-10-29 DIAGNOSIS — Z789 Other specified health status: Secondary | ICD-10-CM | POA: Diagnosis not present

## 2015-10-29 DIAGNOSIS — I10 Essential (primary) hypertension: Secondary | ICD-10-CM | POA: Diagnosis not present

## 2015-10-29 DIAGNOSIS — E119 Type 2 diabetes mellitus without complications: Secondary | ICD-10-CM | POA: Diagnosis not present

## 2015-10-29 DIAGNOSIS — E1142 Type 2 diabetes mellitus with diabetic polyneuropathy: Secondary | ICD-10-CM | POA: Diagnosis not present

## 2015-11-06 DIAGNOSIS — M47816 Spondylosis without myelopathy or radiculopathy, lumbar region: Secondary | ICD-10-CM | POA: Diagnosis not present

## 2015-11-06 DIAGNOSIS — M5126 Other intervertebral disc displacement, lumbar region: Secondary | ICD-10-CM | POA: Diagnosis not present

## 2015-11-22 ENCOUNTER — Encounter: Payer: Self-pay | Admitting: Cardiology

## 2015-11-22 ENCOUNTER — Ambulatory Visit (INDEPENDENT_AMBULATORY_CARE_PROVIDER_SITE_OTHER): Payer: Medicare Other | Admitting: Cardiology

## 2015-11-22 ENCOUNTER — Encounter: Payer: Self-pay | Admitting: *Deleted

## 2015-11-22 VITALS — BP 152/64 | HR 61 | Ht 66.0 in | Wt 205.0 lb

## 2015-11-22 DIAGNOSIS — R600 Localized edema: Secondary | ICD-10-CM | POA: Diagnosis not present

## 2015-11-22 DIAGNOSIS — I6529 Occlusion and stenosis of unspecified carotid artery: Secondary | ICD-10-CM

## 2015-11-22 DIAGNOSIS — I10 Essential (primary) hypertension: Secondary | ICD-10-CM | POA: Diagnosis not present

## 2015-11-22 NOTE — Progress Notes (Signed)
Patient ID: Rhonda Mejia, female   DOB: 10-05-1928, 82 y.o.   MRN: [id number]     Clinical Summary Rhonda Mejia is a 82 y.o.female seen today for follow up of the following medical problems.  1. HTN - compliant with meds - does not checkbp  regularly at home  2. LE edema - echo 02/2015 difficult study ,with LVEF 50-55%, normal RV function. Diastolic function not described.   - norvasc stopped at last visit due to LE edema. Lasix increased to 40mg  daily. Edema has improved  - repeat echo 09/2015 LVEF 60-65%, abnormal diastolic function grade indeterminate -she reports  recent labs with Dr Manuella Ghazi  3. Carotid stenosis - carotid US 09/2015 with concern for distal left CCA stenosis, 1-39% right ICA stenosis. CTA recommended to better evaluate left side.  Past Medical History  Diagnosis Date  . Family hx of colon cancer   . Hypertension     for over 20 yrs  . High cholesterol   . Hypothyroid   . GERD (gastroesophageal reflux disease)   . Pill dysphagia   . Diabetes mellitus without complication (HCC)     diet controlled  . Neuropathy ([place])   . History of gout   . [facility])     left breast cancer     Allergies  Allergen Reactions  . Penicillins     Rash   . Sulfa Antibiotics     Unknown reaction      Current Outpatient Prescriptions  Medication Sig Dispense Refill  . allopurinol (ZYLOPRIM) 300 MG tablet Take 300 mg by mouth daily.    . Cholecalciferol (VITAMIN D3) 5000 units CAPS Take 1 capsule by mouth daily.    . Cyanocobalamin (B-12) 2500 MCG TABS Take 1 tablet by mouth daily.    Marland Kitchen docusate sodium (COLACE) 100 MG capsule Take 100 mg by mouth daily as needed for mild constipation.    . folic acid (FOLVITE) 1 MG tablet Take 1 mg by mouth daily.    . furosemide (LASIX) 40 MG tablet Take 1 tablet (40 mg total) by mouth daily. 90 tablet 3  . gabapentin (NEURONTIN) 300 MG capsule Take 300 mg by mouth 2 (two) times daily.  2  . levothyroxine (SYNTHROID, LEVOTHROID)  100 MCG tablet Take 100 mcg by mouth daily.    Marland Kitchen lisinopril (PRINIVIL,ZESTRIL) 40 MG tablet Take 40 mg by mouth daily.    . metoprolol succinate (TOPROL-XL) 50 MG 24 hr tablet Take 50 mg by mouth daily.  6  . omeprazole (PRILOSEC) 40 MG capsule Take 40 mg by mouth daily.    . potassium chloride (K-DUR,KLOR-CON) 10 MEQ tablet Take 10 mEq by mouth daily.  6  . pyridOXINE (VITAMIN B-6) 100 MG tablet Take 100 mg by mouth daily.    Marland Kitchen senna (SENOKOT) 8.6 MG tablet Take 1 tablet by mouth as needed for constipation.    . traMADol (ULTRAM) 50 MG tablet Take 50 mg by mouth every 6 (six) hours as needed for pain.    Marland Kitchen venlafaxine (EFFEXOR) 75 MG tablet Take 75 mg by mouth daily.      No current facility-administered medications for this visit.     Past Surgical History  Procedure Laterality Date  . Breast lumpectomy      left breast  . Knee surgery Right     arthroscopy x2  . Cholecystectomy    . Colonoscopy N/A 10/21/2012    Procedure: COLONOSCOPY;  Surgeon: Rogene Houston, MD;  [facility];  Service: Endoscopy;  Laterality: N/A;  . Esophagogastroduodenoscopy (egd) with esophageal dilation N/A 06/09/2013    Procedure: ESOPHAGOGASTRODUODENOSCOPY (EGD) WITH ESOPHAGEAL DILATION;  Surgeon: Rogene Houston, MD;  Location: [facility];  Service: Endoscopy;  Laterality: N/A;  125  . Back surgery      5 rods and 5 screws and cage in back, has had 4 back surgeries.  . Cataract extraction w/phaco Left 12/31/2014    Procedure: CATARACT EXTRACTION PHACO AND INTRAOCULAR LENS PLACEMENT (IOC);  Surgeon: Tonny Ibraheem Voris, MD;  Location: [facility];  Service: Ophthalmology;  Laterality: Left;  CDE:8.45  . Cataract extraction w/phaco Right 01/28/2015    Procedure: CATARACT EXTRACTION PHACO AND INTRAOCULAR LENS PLACEMENT RIGHT EYE;  Surgeon: Tonny Martesha Niedermeier, MD;  Location: [facility];  Service: Ophthalmology;  Laterality: Right;  CDE:4.92     Allergies  Allergen Reactions  . Penicillins     Rash   . Sulfa  Antibiotics     Unknown reaction       Family History  Problem Relation Age of Onset  . Colon cancer Brother      Social History Rhonda Mejia reports that she has never smoked. She has never used smokeless tobacco. Rhonda Mejia reports that she does not drink alcohol.   Review of Systems CONSTITUTIONAL: No weight loss, fever, chills, weakness or fatigue.  HEENT: Eyes: No visual loss, blurred vision, double vision or yellow sclerae.No hearing loss, sneezing, congestion, runny nose or sore throat.  SKIN: No rash or itching.  CARDIOVASCULAR: no chest pain,, no  palpitations RESPIRATORY: No shortness of breath, cough or sputum.  GASTROINTESTINAL: No anorexia, nausea, vomiting or diarrhea. No abdominal pain or blood.  GENITOURINARY: No burning on urination, no polyuria NEUROLOGICAL: No headache, dizziness, syncope, paralysis, ataxia, numbness or tingling in the extremities. No change in bowel or bladder control.  MUSCULOSKELETAL: No muscle, back pain, joint pain or stiffness.  LYMPHATICS: No enlarged nodes. No history of splenectomy.  PSYCHIATRIC: No history of depression or anxiety.  ENDOCRINOLOGIC: No reports of sweating, cold or heat intolerance. No polyuria or polydipsia.  Marland Kitchen   Physical Examination Filed Vitals:   11/22/15 1148  BP: 152/64  Pulse: 61   Filed Vitals:   11/22/15 1148  Height: 5\' 6"  (1.676 m)  Weight: 205 lb (92.987 kg)    Gen: resting comfortably, no acute distress HEENT: no scleral icterus, pupils equal round and reactive, no palptable cervical adenopathy,  CV: RRR, no m/r/g, no jvd Resp: Clear to auscultation bilaterally GI: abdomen is soft, non-tender, non-distended, normal bowel sounds, no hepatosplenomegaly MSK: extremities are warm, no edema.  Skin: warm, no rash Neuro:  no focal deficits Psych: appropriate affect   Diagnostic Studies  09/2015 echo Study Conclusions  - Left ventricle: The cavity size was normal. Wall thickness was   increased in a pattern of mild LVH. Systolic function was normal.  The estimated ejection fraction was in the range of 60% to 65%.  Diastolic function is abnormal, indeterminate grade. Wall motion  was normal; there were no regional wall motion abnormalities. - Aortic valve: Mildly calcified annulus. Trileaflet; mildly  thickened leaflets. Valve area (VTI): 1.52 cm^2. Valve area  (Vmax): 1.43 cm^2. - Mitral valve: Mildly calcified annulus. Normal thickness leaflets  . - Technically adequate study.   Assessment and Plan  1. HTN  - we will continue current meds. Reasonable control given her age  76. LE edema - imroved off norvasc and with higher dose of lasix  - continue to  monitor  3. Carotid stenosis - order CTA, concern for possible significant left sided CCA stenosis        Arnoldo Lenis, M.D

## 2015-11-22 NOTE — Patient Instructions (Addendum)
Your physician recommends that you schedule a follow-up appointment in: 4 months with Dr. Harl Bowie  Your physician recommends that you continue on your current medications as directed. Please refer to the Current Medication list given to you today.  Your physician recommends that you return for lab work PRIOR TO YOUR TEST BMP  Non-Cardiac CT Angiography (CTA), is a special type of CT scan that uses a computer to produce multi-dimensional views of major blood vessels throughout the body. In CT angiography, a contrast material is injected through an IV to help visualize the blood vessels  Thank you for choosing [facility]!!

## 2015-11-25 ENCOUNTER — Ambulatory Visit (HOSPITAL_COMMUNITY): Payer: Medicare Other

## 2015-11-27 ENCOUNTER — Telehealth: Payer: Self-pay | Admitting: *Deleted

## 2015-11-27 ENCOUNTER — Ambulatory Visit (HOSPITAL_COMMUNITY)
Admission: RE | Admit: 2015-11-27 | Discharge: 2015-11-27 | Disposition: A | Discharge status: Home / Self Care | Payer: Medicare Other | Source: Ambulatory Visit | Attending: Cardiology | Admitting: Cardiology

## 2015-11-27 DIAGNOSIS — M47892 Other spondylosis, cervical region: Secondary | ICD-10-CM | POA: Insufficient documentation

## 2015-11-27 DIAGNOSIS — I6529 Occlusion and stenosis of unspecified carotid artery: Secondary | ICD-10-CM | POA: Diagnosis present

## 2015-11-27 DIAGNOSIS — I6523 Occlusion and stenosis of bilateral carotid arteries: Secondary | ICD-10-CM | POA: Diagnosis not present

## 2015-11-27 MED ORDER — IOPAMIDOL (ISOVUE-370) INJECTION 76%
60.0000 mL | Freq: Once | INTRAVENOUS | Status: AC | PRN
  Administered 2015-11-27: 60 mL via INTRAVENOUS

## 2015-11-27 NOTE — Telephone Encounter (Signed)
-----   Message from Arnoldo Lenis, MD sent at 11/26/2015 12:30 PM EDT ----- Labs overall look good  J branch MD

## 2015-11-27 NOTE — Telephone Encounter (Signed)
Pt aware, routed to pcp 

## 2015-12-02 ENCOUNTER — Telehealth: Payer: Self-pay | Admitting: *Deleted

## 2015-12-02 DIAGNOSIS — Z961 Presence of intraocular lens: Secondary | ICD-10-CM | POA: Diagnosis not present

## 2015-12-02 DIAGNOSIS — I6529 Occlusion and stenosis of unspecified carotid artery: Secondary | ICD-10-CM

## 2015-12-02 DIAGNOSIS — H35363 Drusen (degenerative) of macula, bilateral: Secondary | ICD-10-CM | POA: Diagnosis not present

## 2015-12-02 NOTE — Telephone Encounter (Signed)
Pt aware and agreeable to see vascular surgery. Referral placed and forwarded to schedulers. Pt says she cannot drive to [place] and will be relying on son for transportation.

## 2015-12-02 NOTE — Telephone Encounter (Signed)
-----   Message from Arnoldo Lenis, MD sent at 11/29/2015 12:29 PM EDT ----- The patient's CT of her neck does show a blockage on the left in the 75% range as well as some old damage to the that artery. I'd like her to see vascular surgery in [facility] to get there thoughts and help monitor.   Zandra Abts MD

## 2015-12-04 ENCOUNTER — Ambulatory Visit (INDEPENDENT_AMBULATORY_CARE_PROVIDER_SITE_OTHER): Payer: Medicare Other | Admitting: Vascular Surgery

## 2015-12-04 ENCOUNTER — Encounter: Payer: Self-pay | Admitting: Vascular Surgery

## 2015-12-04 VITALS — BP 147/66 | HR 66 | Temp 97.9°F | Resp 18 | Ht 65.0 in | Wt 209.0 lb

## 2015-12-04 DIAGNOSIS — I6529 Occlusion and stenosis of unspecified carotid artery: Secondary | ICD-10-CM

## 2015-12-04 NOTE — Progress Notes (Signed)
History of Present Illness:  Patient is a 82 y.o. year old female who presents for evaluation of carotid stenosis.  The patient denies symptoms of TIA, amaurosis, or stroke.  The patient is currently not on antiplatelet therapy. The carotid stenosis was found CTA after carotid duplex.  Other medical problems include venous insufficiency, peripheral neuropathy, spinal fusion surgery, hypertension managed with lisinopril and metoprolol , hypercholesterolemia, and diet controlled DM.    These are currently stable and followed by Dr. Harl Bowie in [place].  Past Medical History  Diagnosis Date  . Family hx of colon cancer   . Hypertension     for over 20 yrs  . High cholesterol   . Hypothyroid   . GERD (gastroesophageal reflux disease)   . Pill dysphagia   . Diabetes mellitus without complication (HCC)     diet controlled  . Neuropathy ([place])   . History of gout   . Cancer [facility])     left breast cancer    Past Surgical History  Procedure Laterality Date  . Breast lumpectomy      left breast  . Knee surgery Right     arthroscopy x2  . Cholecystectomy    . Colonoscopy N/A 10/21/2012    Procedure: COLONOSCOPY;  Surgeon: Rogene Houston, MD;  [facility];  Service: Endoscopy;  Laterality: N/A;  . Esophagogastroduodenoscopy (egd) with esophageal dilation N/A 06/09/2013    Procedure: ESOPHAGOGASTRODUODENOSCOPY (EGD) WITH ESOPHAGEAL DILATION;  Surgeon: Rogene Houston, MD;  Location: [facility];  Service: Endoscopy;  Laterality: N/A;  125  . Back surgery      5 rods and 5 screws and cage in back, has had 4 back surgeries.  . Cataract extraction w/phaco Left 12/31/2014    Procedure: CATARACT EXTRACTION PHACO AND INTRAOCULAR LENS PLACEMENT (IOC);  Surgeon: Tonny Branch, MD;  Location: [facility] ORS;  Service: Ophthalmology;  Laterality: Left;  CDE:8.45  . Cataract extraction w/phaco Right 01/28/2015    Procedure: CATARACT EXTRACTION PHACO AND INTRAOCULAR LENS PLACEMENT RIGHT EYE;  Surgeon:  Tonny Branch, MD;  Location: [facility] ORS;  Service: Ophthalmology;  Laterality: Right;  CDE:4.92  . Spine surgery      3 surgeries by Dr. Jenne Campus,  Last surgery 2 years ago by Dr. Carloyn Manner in [place] History  Substance Use Topics  . Smoking status: Never Smoker   . Smokeless tobacco: Never Used  . Alcohol Use: No    Family History Family History  Problem Relation Age of Onset  . Colon cancer Brother     Allergies  Allergies  Allergen Reactions  . Penicillins     Rash   . Sulfa Antibiotics     Unknown reaction      Current Outpatient Prescriptions  Medication Sig Dispense Refill  . allopurinol (ZYLOPRIM) 300 MG tablet Take 300 mg by mouth daily.    . Cholecalciferol (VITAMIN D3) 5000 units CAPS Take 1 capsule by mouth daily.    . Cyanocobalamin (B-12) 2500 MCG TABS Take 1 tablet by mouth daily.    Marland Kitchen docusate sodium (COLACE) 100 MG capsule Take 100 mg by mouth daily as needed for mild constipation.    . folic acid (FOLVITE) 1 MG tablet Take 1 mg by mouth daily.    . furosemide (LASIX) 40 MG tablet Take 1 tablet (40 mg total) by mouth daily. 90 tablet 3  . gabapentin (NEURONTIN) 300 MG capsule Take 300 mg by mouth  2 (two) times daily.  2  . hydrALAZINE (APRESOLINE) 25 MG tablet Take 25 mg by mouth 2 (two) times daily.    Marland Kitchen levothyroxine (SYNTHROID, LEVOTHROID) 100 MCG tablet Take 100 mcg by mouth daily.    Marland Kitchen lisinopril (PRINIVIL,ZESTRIL) 40 MG tablet Take 40 mg by mouth daily.    . metoprolol succinate (TOPROL-XL) 50 MG 24 hr tablet Take 50 mg by mouth daily.  6  . omeprazole (PRILOSEC) 40 MG capsule Take 40 mg by mouth daily.    . potassium chloride (K-DUR,KLOR-CON) 10 MEQ tablet Take 10 mEq by mouth daily.  6  . pyridOXINE (VITAMIN B-6) 100 MG tablet Take 100 mg by mouth daily.    Marland Kitchen senna (SENOKOT) 8.6 MG tablet Take 1 tablet by mouth as needed for constipation.    . traMADol (ULTRAM) 50 MG tablet Take 50 mg by mouth every 6 (six) hours as needed for  pain.    Marland Kitchen venlafaxine (EFFEXOR) 75 MG tablet Take 75 mg by mouth daily.      No current facility-administered medications for this visit.    ROS:   General:  No weight loss, Fever, chills  HEENT: No recent headaches, no nasal bleeding, no visual changes, no sore throat  Neurologic: No dizziness, blackouts, seizures. No recent symptoms of stroke or mini- stroke. No recent episodes of slurred speech, or temporary blindness.  Cardiac: No recent episodes of chest pain/pressure, no shortness of breath at rest.  No shortness of breath with exertion.  Denies history of atrial fibrillation or irregular heartbeat  Vascular: No history of rest pain in feet.  No history of claudication.  No history of non-healing ulcer, No history of DVT   Pulmonary: No home oxygen, no productive cough, no hemoptysis,  No asthma or wheezing  Musculoskeletal:  [x ] Arthritis, [ x] Low back pain,  [ ]  Joint pain  Hematologic:No history of hypercoagulable state.  No history of easy bleeding.  No history of anemia  Gastrointestinal: No hematochezia or melena,  No gastroesophageal reflux, no trouble swallowing  Urinary: [ ]  chronic Kidney disease, [ ]  on HD - [ ]  MWF or [ ]  TTHS, [ ]  Burning with urination, [ ]  Frequent urination, [ ]  Difficulty urinating;   Skin: No rashes, mild varicosities and edema in the lower leg  Psychological: No history of anxiety,  No history of depression   Physical Examination  Filed Vitals:   12/04/15 1239 12/04/15 1246  BP: 150/68 147/66  Pulse: 64 66  Temp: 97.9 F (36.6 C)   TempSrc: Oral   Resp: 18   Height: 5\' 5"  (1.651 m)   Weight: 209 lb (94.802 kg)   SpO2: 98%     Body mass index is 34.78 kg/(m^2).  General:  Alert and oriented, no acute distress HEENT: Normal Neck: positive left carotid bruit  Pulmonary: Clear to auscultation bilaterally Cardiac: Regular Rate and Rhythm without murmur Gastrointestinal: Soft, non-tender, non-distended, no mass, no  scars Skin: No rash, mild LE varicosities  Extremity Pulses:  2+ radial, brachial, femoral, dorsalis pedis,  pulses bilaterally Musculoskeletal: No deformity or  Positive LE not including the feetedema  Neurologic: Upper and lower extremity motor 5/5 and symmetric  DATA:  CTA  11/27/2015 IMPRESSION: 1. 75% stenosis of the left internal carotid artery at the bifurcation secondary to prominent posterior lateral soft tissue neck calcified plaque. 2. Atherosclerotic calcifications at the right carotid bifurcation and origins the great vessels without other focal stenoses. 3. Focal contained dissection in  small pseudoaneurysm in the cervical left ICA measuring 8 mm in length. 4. Multilevel spondylosis of the cervical spine is most pronounced at C4-5 and C5-6.  Carotid duplex 10/09/2015 < 40% bilaterally   ASSESSMENT:  Mild asymptomatic Carotid stenosis Varicosities bilateral LE  PLAN: Her carotid stenosis is mild.  Dr. Scot Dock reviewed the CTA and duplex studies.  The velocities of the duplex show less than 40% blockage.  The CTA shows significant calcified plaque in the ICA which tends to interfere with the contrast.  We recommend repeat carotid duplex studies in 6 months.   She may benefit from Asprin therapy 81 mg daily and a stain to prevent further vascular progression.   She has compression stockings at home and is unable to put them on secondary to her lumbar surgery.  We demonstrated proper elevation of her LE at least 15 min twice per day and activity such as walking and or water therapy to help with the LE edema do to venous reflux.    Theda Sers Lorali Khamis [facility] PA-C [facility] [place] Office: [phone]  The patient was seen in conjunction with Dr. Scot Dock today.  I have interviewed the patient and examined the patient. I agree with the findings by the PA.  By duplex scan the patient has only mild carotid disease bilaterally. Given the calcific  disease I think the CT findings are misleading. I recommended a follow up duplex scan in 6 months and also her back at that time. We have recommended that she begin taking aspirin. We have also discussed the use of statins. An vascular patients there is evidence that those on both and aspirin and low-dose statin have significantly lower mortality. However she will discuss this with her primary care physician.  The patient also has chronic venous insufficiency.  I've explained that this is a chronic problem. We have discussed the importance of intermittent leg elevation in the proper positioning for this. In addition, I have discussed the importance of wearing compression stockings when they will be standing or sitting for a long time.  I have encouraged him to stay as active as possible and to avoid prolonged sitting and standing. We have also discussed water aerobics which I think is also very helpful for people with chronic venous insufficiency.   Gae Gallop, MD [phone]

## 2015-12-12 ENCOUNTER — Encounter: Payer: Medicare Other | Admitting: Vascular Surgery

## 2015-12-24 DIAGNOSIS — I10 Essential (primary) hypertension: Secondary | ICD-10-CM | POA: Diagnosis not present

## 2015-12-24 DIAGNOSIS — E119 Type 2 diabetes mellitus without complications: Secondary | ICD-10-CM | POA: Diagnosis not present

## 2016-01-01 DIAGNOSIS — M25562 Pain in left knee: Secondary | ICD-10-CM | POA: Diagnosis not present

## 2016-01-01 DIAGNOSIS — M25561 Pain in right knee: Secondary | ICD-10-CM | POA: Diagnosis not present

## 2016-01-02 DIAGNOSIS — M47816 Spondylosis without myelopathy or radiculopathy, lumbar region: Secondary | ICD-10-CM | POA: Diagnosis not present

## 2016-01-02 DIAGNOSIS — Z981 Arthrodesis status: Secondary | ICD-10-CM | POA: Diagnosis not present

## 2016-01-24 DIAGNOSIS — I10 Essential (primary) hypertension: Secondary | ICD-10-CM | POA: Diagnosis not present

## 2016-01-24 DIAGNOSIS — E119 Type 2 diabetes mellitus without complications: Secondary | ICD-10-CM | POA: Diagnosis not present

## 2016-01-28 DIAGNOSIS — I10 Essential (primary) hypertension: Secondary | ICD-10-CM | POA: Diagnosis not present

## 2016-01-28 DIAGNOSIS — Z299 Encounter for prophylactic measures, unspecified: Secondary | ICD-10-CM | POA: Diagnosis not present

## 2016-01-28 DIAGNOSIS — E1122 Type 2 diabetes mellitus with diabetic chronic kidney disease: Secondary | ICD-10-CM | POA: Diagnosis not present

## 2016-01-28 DIAGNOSIS — E1142 Type 2 diabetes mellitus with diabetic polyneuropathy: Secondary | ICD-10-CM | POA: Diagnosis not present

## 2016-02-07 DIAGNOSIS — R296 Repeated falls: Secondary | ICD-10-CM | POA: Diagnosis not present

## 2016-02-07 DIAGNOSIS — R51 Headache: Secondary | ICD-10-CM | POA: Diagnosis not present

## 2016-02-07 DIAGNOSIS — S0003XA Contusion of scalp, initial encounter: Secondary | ICD-10-CM | POA: Diagnosis not present

## 2016-02-09 DIAGNOSIS — Z853 Personal history of malignant neoplasm of breast: Secondary | ICD-10-CM | POA: Diagnosis not present

## 2016-02-09 DIAGNOSIS — K219 Gastro-esophageal reflux disease without esophagitis: Secondary | ICD-10-CM | POA: Diagnosis not present

## 2016-02-09 DIAGNOSIS — Z79899 Other long term (current) drug therapy: Secondary | ICD-10-CM | POA: Diagnosis not present

## 2016-02-09 DIAGNOSIS — S0003XA Contusion of scalp, initial encounter: Secondary | ICD-10-CM | POA: Diagnosis not present

## 2016-02-09 DIAGNOSIS — W01190A Fall on same level from slipping, tripping and stumbling with subsequent striking against furniture, initial encounter: Secondary | ICD-10-CM | POA: Diagnosis not present

## 2016-02-09 DIAGNOSIS — E119 Type 2 diabetes mellitus without complications: Secondary | ICD-10-CM | POA: Diagnosis not present

## 2016-02-09 DIAGNOSIS — I10 Essential (primary) hypertension: Secondary | ICD-10-CM | POA: Diagnosis not present

## 2016-02-09 DIAGNOSIS — S0101XA Laceration without foreign body of scalp, initial encounter: Secondary | ICD-10-CM | POA: Diagnosis not present

## 2016-02-09 DIAGNOSIS — E039 Hypothyroidism, unspecified: Secondary | ICD-10-CM | POA: Diagnosis not present

## 2016-02-09 DIAGNOSIS — S0993XA Unspecified injury of face, initial encounter: Secondary | ICD-10-CM | POA: Diagnosis not present

## 2016-02-09 DIAGNOSIS — S199XXA Unspecified injury of neck, initial encounter: Secondary | ICD-10-CM | POA: Diagnosis not present

## 2016-02-09 DIAGNOSIS — M109 Gout, unspecified: Secondary | ICD-10-CM | POA: Diagnosis not present

## 2016-02-09 DIAGNOSIS — S0990XA Unspecified injury of head, initial encounter: Secondary | ICD-10-CM | POA: Diagnosis not present

## 2016-02-18 ENCOUNTER — Other Ambulatory Visit: Payer: Self-pay | Admitting: Vascular Surgery

## 2016-02-18 DIAGNOSIS — I6529 Occlusion and stenosis of unspecified carotid artery: Secondary | ICD-10-CM

## 2016-02-21 DIAGNOSIS — S0003XA Contusion of scalp, initial encounter: Secondary | ICD-10-CM | POA: Diagnosis not present

## 2016-02-21 DIAGNOSIS — E1122 Type 2 diabetes mellitus with diabetic chronic kidney disease: Secondary | ICD-10-CM | POA: Diagnosis not present

## 2016-02-21 DIAGNOSIS — Z299 Encounter for prophylactic measures, unspecified: Secondary | ICD-10-CM | POA: Diagnosis not present

## 2016-03-10 DIAGNOSIS — I10 Essential (primary) hypertension: Secondary | ICD-10-CM | POA: Diagnosis not present

## 2016-03-10 DIAGNOSIS — M5126 Other intervertebral disc displacement, lumbar region: Secondary | ICD-10-CM | POA: Diagnosis not present

## 2016-03-10 DIAGNOSIS — M47816 Spondylosis without myelopathy or radiculopathy, lumbar region: Secondary | ICD-10-CM | POA: Diagnosis not present

## 2016-03-10 DIAGNOSIS — E119 Type 2 diabetes mellitus without complications: Secondary | ICD-10-CM | POA: Diagnosis not present

## 2016-03-16 DIAGNOSIS — M4326 Fusion of spine, lumbar region: Secondary | ICD-10-CM | POA: Diagnosis not present

## 2016-03-16 DIAGNOSIS — Z981 Arthrodesis status: Secondary | ICD-10-CM | POA: Diagnosis not present

## 2016-03-16 DIAGNOSIS — R200 Anesthesia of skin: Secondary | ICD-10-CM | POA: Diagnosis not present

## 2016-03-16 DIAGNOSIS — Z9181 History of falling: Secondary | ICD-10-CM | POA: Diagnosis not present

## 2016-03-16 DIAGNOSIS — M7989 Other specified soft tissue disorders: Secondary | ICD-10-CM | POA: Diagnosis not present

## 2016-03-16 DIAGNOSIS — M5126 Other intervertebral disc displacement, lumbar region: Secondary | ICD-10-CM | POA: Diagnosis not present

## 2016-03-16 DIAGNOSIS — M5125 Other intervertebral disc displacement, thoracolumbar region: Secondary | ICD-10-CM | POA: Diagnosis not present

## 2016-03-18 DIAGNOSIS — M5124 Other intervertebral disc displacement, thoracic region: Secondary | ICD-10-CM | POA: Diagnosis not present

## 2016-03-18 DIAGNOSIS — M5126 Other intervertebral disc displacement, lumbar region: Secondary | ICD-10-CM | POA: Diagnosis not present

## 2016-03-18 DIAGNOSIS — M47816 Spondylosis without myelopathy or radiculopathy, lumbar region: Secondary | ICD-10-CM | POA: Diagnosis not present

## 2016-03-26 ENCOUNTER — Ambulatory Visit: Payer: Medicare Other | Admitting: Cardiology

## 2016-04-02 DIAGNOSIS — R269 Unspecified abnormalities of gait and mobility: Secondary | ICD-10-CM | POA: Diagnosis not present

## 2016-04-02 DIAGNOSIS — E1122 Type 2 diabetes mellitus with diabetic chronic kidney disease: Secondary | ICD-10-CM | POA: Diagnosis not present

## 2016-04-02 DIAGNOSIS — Z6835 Body mass index (BMI) 35.0-35.9, adult: Secondary | ICD-10-CM | POA: Diagnosis not present

## 2016-04-02 DIAGNOSIS — Z299 Encounter for prophylactic measures, unspecified: Secondary | ICD-10-CM | POA: Diagnosis not present

## 2016-04-02 DIAGNOSIS — M1990 Unspecified osteoarthritis, unspecified site: Secondary | ICD-10-CM | POA: Diagnosis not present

## 2016-04-07 DIAGNOSIS — E1122 Type 2 diabetes mellitus with diabetic chronic kidney disease: Secondary | ICD-10-CM | POA: Diagnosis not present

## 2016-04-07 DIAGNOSIS — M5126 Other intervertebral disc displacement, lumbar region: Secondary | ICD-10-CM | POA: Diagnosis not present

## 2016-04-07 DIAGNOSIS — M47816 Spondylosis without myelopathy or radiculopathy, lumbar region: Secondary | ICD-10-CM | POA: Diagnosis not present

## 2016-04-07 DIAGNOSIS — M5124 Other intervertebral disc displacement, thoracic region: Secondary | ICD-10-CM | POA: Diagnosis not present

## 2016-04-08 DIAGNOSIS — E119 Type 2 diabetes mellitus without complications: Secondary | ICD-10-CM | POA: Diagnosis not present

## 2016-04-08 DIAGNOSIS — I10 Essential (primary) hypertension: Secondary | ICD-10-CM | POA: Diagnosis not present

## 2016-04-14 ENCOUNTER — Encounter: Payer: Self-pay | Admitting: *Deleted

## 2016-04-15 ENCOUNTER — Encounter: Payer: Self-pay | Admitting: *Deleted

## 2016-04-15 ENCOUNTER — Ambulatory Visit (INDEPENDENT_AMBULATORY_CARE_PROVIDER_SITE_OTHER): Payer: Medicare Other | Admitting: Cardiology

## 2016-04-15 ENCOUNTER — Encounter: Payer: Self-pay | Admitting: Cardiology

## 2016-04-15 VITALS — BP 148/64 | HR 68 | Ht 65.0 in | Wt 216.0 lb

## 2016-04-15 DIAGNOSIS — I10 Essential (primary) hypertension: Secondary | ICD-10-CM | POA: Diagnosis not present

## 2016-04-15 DIAGNOSIS — R600 Localized edema: Secondary | ICD-10-CM | POA: Diagnosis not present

## 2016-04-15 DIAGNOSIS — I6529 Occlusion and stenosis of unspecified carotid artery: Secondary | ICD-10-CM

## 2016-04-15 MED ORDER — ATORVASTATIN CALCIUM 40 MG PO TABS: 40.0000 mg | ORAL_TABLET | Freq: Every day | ORAL | 3 refills | Status: DC

## 2016-04-15 NOTE — Patient Instructions (Signed)
Your physician recommends that you schedule a follow-up appointment in: 3 MONTHS WITH DR. Ridgeway   Your physician has recommended you make the following change in your medication:   START ATORVASTATIN 40 MG DAILY  TAKE LASIX 60 MG DAILY FOR 3 DAYS THEN RESUME 40 MG DAILY  Thank you for choosing [facility]!!

## 2016-04-15 NOTE — Progress Notes (Signed)
Clinical Summary Rhonda Mejia is a 82 y.o.female  seen today for follow up of the following medical problems.  1. HTN - compliant with meds - she does not check bp  regularly at home - compliant with meds, but has not taken meds yet today   2. LE edema - echo 02/2015 difficult study ,with LVEF 50-55%, normal RV function. Diastolic function not described.  - norvasc stopped at last visit due to LE edema. Lasix increased to 40mg  daily. Edema has improved  - repeat echo 09/2015 LVEF 60-65%, abnormal diastolic function grade indeterminate -she reports  recent labs with Dr Manuella Ghazi  - followed by vascular for chronic venous insufficiency - unable to get compression stockings on.  - home weights 207 and stable   3. Carotid stenosis - carotid US 09/2015 with concern for distal left CCA stenosis, 1-39% right ICA stenosis.  - 11/2015 CTA neck with [id number] LICA stenosis, focal contained dissecion left ICA, no significant RICA disease - followed by vascular. From there consultation CTA results thought to be misleading due to heavy calficications and fairly mild velocities by carotid US.   4. Back pain - followed by Dr Carloyn Manner - considering repeat back surgery  5. DM2 - diet controlled Past Medical History:  Diagnosis Date  . Cancer [facility])    left breast cancer  . Diabetes mellitus without complication (HCC)    diet controlled  . Family hx of colon cancer   . GERD (gastroesophageal reflux disease)   . High cholesterol   . History of gout   . Hypertension    for over 20 yrs  . Hypothyroid   . Neuropathy ([place])   . Pill dysphagia      Allergies  Allergen Reactions  . Penicillins     Rash   . Sulfa Antibiotics     Unknown reaction      Current Outpatient Prescriptions  Medication Sig Dispense Refill  . allopurinol (ZYLOPRIM) 300 MG tablet Take 300 mg by mouth daily.    . Cholecalciferol (VITAMIN D3) 5000 units CAPS Take 1 capsule by mouth daily.    . Cyanocobalamin (B-12)  2500 MCG TABS Take 1 tablet by mouth daily.    Marland Kitchen docusate sodium (COLACE) 100 MG capsule Take 100 mg by mouth daily as needed for mild constipation.    . folic acid (FOLVITE) 1 MG tablet Take 1 mg by mouth daily.    . furosemide (LASIX) 40 MG tablet Take 1 tablet (40 mg total) by mouth daily. 90 tablet 3  . gabapentin (NEURONTIN) 300 MG capsule Take 300 mg by mouth 2 (two) times daily.  2  . hydrALAZINE (APRESOLINE) 25 MG tablet Take 25 mg by mouth 2 (two) times daily.    Marland Kitchen levothyroxine (SYNTHROID, LEVOTHROID) 100 MCG tablet Take 100 mcg by mouth daily.    Marland Kitchen lisinopril (PRINIVIL,ZESTRIL) 40 MG tablet Take 40 mg by mouth daily.    . metoprolol succinate (TOPROL-XL) 50 MG 24 hr tablet Take 50 mg by mouth daily.  6  . omeprazole (PRILOSEC) 40 MG capsule Take 40 mg by mouth daily.    . potassium chloride (K-DUR,KLOR-CON) 10 MEQ tablet Take 10 mEq by mouth daily.  6  . pyridOXINE (VITAMIN B-6) 100 MG tablet Take 100 mg by mouth daily.    Marland Kitchen senna (SENOKOT) 8.6 MG tablet Take 1 tablet by mouth as needed for constipation.    . traMADol (ULTRAM) 50 MG tablet Take 50 mg by mouth every 6 (  six) hours as needed for pain.    Marland Kitchen venlafaxine (EFFEXOR) 75 MG tablet Take 75 mg by mouth daily.      No current facility-administered medications for this visit.      Past Surgical History:  Procedure Laterality Date  . BACK SURGERY     5 rods and 5 screws and cage in back, has had 4 back surgeries.  Marland Kitchen BREAST LUMPECTOMY     left breast  . CATARACT EXTRACTION W/PHACO Left 12/31/2014   Procedure: CATARACT EXTRACTION PHACO AND INTRAOCULAR LENS PLACEMENT (IOC);  Surgeon: Tonny Branch, MD;  Location: [facility];  Service: Ophthalmology;  Laterality: Left;  CDE:8.45  . CATARACT EXTRACTION W/PHACO Right 01/28/2015   Procedure: CATARACT EXTRACTION PHACO AND INTRAOCULAR LENS PLACEMENT RIGHT EYE;  Surgeon: Tonny Branch, MD;  Location: [facility] OR[facility];  Service: Ophthalmology;  Laterality: Right;  CDE:4.92  . CHOLECYSTECTOMY    .  COLONOSCOPY N/A 10/21/2012   Procedure: COLONOSCOPY;  Surgeon: Rogene Houston, MD;  Location: [facility];  Service: Endoscopy;  Laterality: N/A;  . ESOPHAGOGASTRODUODENOSCOPY (EGD) WITH ESOPHAGEAL DILATION N/A 06/09/2013   Procedure: ESOPHAGOGASTRODUODENOSCOPY (EGD) WITH ESOPHAGEAL DILATION;  Surgeon: Rogene Houston, MD;  Location: [facility];  Service: Endoscopy;  Laterality: N/A;  125  . KNEE SURGERY Right    arthroscopy x2  . SPINE SURGERY     3 surgeries by Dr. Jenne Campus,  Last surgery 2 years ago by Dr. Carloyn Manner in [place] Reactions  . Penicillins     Rash   . Sulfa Antibiotics     Unknown reaction       Family History  Problem Relation Age of Onset  . Colon cancer Brother      Social History Rhonda Mejia reports that she has never smoked. She has never used smokeless tobacco. Rhonda Mejia reports that she does not drink alcohol.   Review of Systems CONSTITUTIONAL: No weight loss, fever, chills, weakness or fatigue.  HEENT: Eyes: No visual loss, blurred vision, double vision or yellow sclerae.No hearing loss, sneezing, congestion, runny nose or sore throat.  SKIN: No rash or itching.  CARDIOVASCULAR: per HPI RESPIRATORY: No shortness of breath, cough or sputum.  GASTROINTESTINAL: No anorexia, nausea, vomiting or diarrhea. No abdominal pain or blood.  GENITOURINARY: No burning on urination, no polyuria NEUROLOGICAL: No headache, dizziness, syncope, paralysis, ataxia, numbness or tingling in the extremities. No change in bowel or bladder control.  MUSCULOSKELETAL: No muscle, back pain, joint pain or stiffness.  LYMPHATICS: No enlarged nodes. No history of splenectomy.  PSYCHIATRIC: No history of depression or anxiety.  ENDOCRINOLOGIC: No reports of sweating, cold or heat intolerance. No polyuria or polydipsia.  Marland Kitchen   Physical Examination Vitals:   04/15/16 1105  BP: (!) 148/64  Pulse: 68   Vitals:   04/15/16 1105  Weight: 216 lb  (98 kg)  Height: 5\' 5"  (1.651 m)    Gen: resting comfortably, no acute distress HEENT: no scleral icterus, pupils equal round and reactive, no palptable cervical adenopathy,  CV: RRR, no m/r/g, o jvd Resp: Clear to auscultation bilaterally GI: abdomen is soft, non-tender, non-distended, normal bowel sounds, no hepatosplenomegaly MSK: extremities are warm, 1+ bilateral LE edema Skin: warm, no rash Neuro:  no focal deficits Psych: appropriate affect   Diagnostic Studies 09/2015 echo Study Conclusions  - Left ventricle: The cavity size was normal. Wall thickness was  increased in a pattern of mild LVH. Systolic function was normal.  The  estimated ejection fraction was in the range of 60% to 65%.  Diastolic function is abnormal, indeterminate grade. Wall motion  was normal; there were no regional wall motion abnormalities. - Aortic valve: Mildly calcified annulus. Trileaflet; mildly  thickened leaflets. Valve area (VTI): 1.52 cm^2. Valve area  (Vmax): 1.43 cm^2. - Mitral valve: Mildly calcified annulus. Normal thickness leaflets  . - Technically adequate study.   11/2015 CTA neck IMPRESSION: 1. 75% stenosis of the left internal carotid artery at the bifurcation secondary to prominent posterior lateral soft tissue neck calcified plaque. 2. Atherosclerotic calcifications at the right carotid bifurcation and origins the great vessels without other focal stenoses. 3. 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 4. Multilevel spondylosis of the cervical spine is most pronounced at C4-5 and C5-6. Assessment and Plan   1. HTN  - we will continue current meds. Elevated in clinic but she has not taken meds yet today, continue to monitor  2. LE edema - continue diuretics. She will take lasix 60mg  x 3 days then resume 40mg  daily.   3. Carotid stenosis - continue to monitor. She has asked for follow up imaging in  [place] as opposed to [place]. - start atorva 40mg  daily.    F/u 3 months   Arnoldo Lenis, M.D.

## 2016-05-05 DIAGNOSIS — L570 Actinic keratosis: Secondary | ICD-10-CM | POA: Diagnosis not present

## 2016-05-05 DIAGNOSIS — L821 Other seborrheic keratosis: Secondary | ICD-10-CM | POA: Diagnosis not present

## 2016-05-22 DIAGNOSIS — M5124 Other intervertebral disc displacement, thoracic region: Secondary | ICD-10-CM | POA: Diagnosis not present

## 2016-05-22 DIAGNOSIS — M47816 Spondylosis without myelopathy or radiculopathy, lumbar region: Secondary | ICD-10-CM | POA: Diagnosis not present

## 2016-05-22 DIAGNOSIS — M5126 Other intervertebral disc displacement, lumbar region: Secondary | ICD-10-CM | POA: Diagnosis not present

## 2016-05-26 DIAGNOSIS — E1142 Type 2 diabetes mellitus with diabetic polyneuropathy: Secondary | ICD-10-CM | POA: Diagnosis not present

## 2016-05-26 DIAGNOSIS — R609 Edema, unspecified: Secondary | ICD-10-CM | POA: Diagnosis not present

## 2016-05-26 DIAGNOSIS — Z23 Encounter for immunization: Secondary | ICD-10-CM | POA: Diagnosis not present

## 2016-05-26 DIAGNOSIS — E1122 Type 2 diabetes mellitus with diabetic chronic kidney disease: Secondary | ICD-10-CM | POA: Diagnosis not present

## 2016-05-26 DIAGNOSIS — Z299 Encounter for prophylactic measures, unspecified: Secondary | ICD-10-CM | POA: Diagnosis not present

## 2016-05-29 DIAGNOSIS — E1142 Type 2 diabetes mellitus with diabetic polyneuropathy: Secondary | ICD-10-CM | POA: Diagnosis not present

## 2016-05-29 DIAGNOSIS — R609 Edema, unspecified: Secondary | ICD-10-CM | POA: Diagnosis not present

## 2016-05-29 DIAGNOSIS — E559 Vitamin D deficiency, unspecified: Secondary | ICD-10-CM | POA: Diagnosis not present

## 2016-06-02 DIAGNOSIS — M47816 Spondylosis without myelopathy or radiculopathy, lumbar region: Secondary | ICD-10-CM | POA: Diagnosis not present

## 2016-06-02 DIAGNOSIS — M9903 Segmental and somatic dysfunction of lumbar region: Secondary | ICD-10-CM | POA: Diagnosis not present

## 2016-06-02 DIAGNOSIS — M5136 Other intervertebral disc degeneration, lumbar region: Secondary | ICD-10-CM | POA: Diagnosis not present

## 2016-06-03 ENCOUNTER — Encounter: Payer: Self-pay | Admitting: Vascular Surgery

## 2016-06-04 DIAGNOSIS — M9903 Segmental and somatic dysfunction of lumbar region: Secondary | ICD-10-CM | POA: Diagnosis not present

## 2016-06-04 DIAGNOSIS — M47816 Spondylosis without myelopathy or radiculopathy, lumbar region: Secondary | ICD-10-CM | POA: Diagnosis not present

## 2016-06-04 DIAGNOSIS — M5136 Other intervertebral disc degeneration, lumbar region: Secondary | ICD-10-CM | POA: Diagnosis not present

## 2016-06-05 DIAGNOSIS — M25561 Pain in right knee: Secondary | ICD-10-CM | POA: Diagnosis not present

## 2016-06-05 DIAGNOSIS — Z299 Encounter for prophylactic measures, unspecified: Secondary | ICD-10-CM | POA: Diagnosis not present

## 2016-06-08 DIAGNOSIS — M47816 Spondylosis without myelopathy or radiculopathy, lumbar region: Secondary | ICD-10-CM | POA: Diagnosis not present

## 2016-06-08 DIAGNOSIS — E039 Hypothyroidism, unspecified: Secondary | ICD-10-CM | POA: Diagnosis not present

## 2016-06-08 DIAGNOSIS — M5136 Other intervertebral disc degeneration, lumbar region: Secondary | ICD-10-CM | POA: Diagnosis not present

## 2016-06-08 DIAGNOSIS — C50919 Malignant neoplasm of unspecified site of unspecified female breast: Secondary | ICD-10-CM | POA: Diagnosis not present

## 2016-06-08 DIAGNOSIS — M109 Gout, unspecified: Secondary | ICD-10-CM | POA: Diagnosis not present

## 2016-06-08 DIAGNOSIS — Z6834 Body mass index (BMI) 34.0-34.9, adult: Secondary | ICD-10-CM | POA: Diagnosis not present

## 2016-06-08 DIAGNOSIS — Z01419 Encounter for gynecological examination (general) (routine) without abnormal findings: Secondary | ICD-10-CM | POA: Diagnosis not present

## 2016-06-08 DIAGNOSIS — I10 Essential (primary) hypertension: Secondary | ICD-10-CM | POA: Diagnosis not present

## 2016-06-08 DIAGNOSIS — M9903 Segmental and somatic dysfunction of lumbar region: Secondary | ICD-10-CM | POA: Diagnosis not present

## 2016-06-10 ENCOUNTER — Ambulatory Visit (HOSPITAL_COMMUNITY)
Admission: RE | Admit: 2016-06-10 | Discharge: 2016-06-10 | Disposition: A | Discharge status: Home / Self Care | Payer: Medicare Other | Source: Ambulatory Visit | Attending: Vascular Surgery | Admitting: Vascular Surgery

## 2016-06-10 ENCOUNTER — Ambulatory Visit (INDEPENDENT_AMBULATORY_CARE_PROVIDER_SITE_OTHER): Payer: Medicare Other | Admitting: Vascular Surgery

## 2016-06-10 ENCOUNTER — Encounter: Payer: Self-pay | Admitting: Vascular Surgery

## 2016-06-10 VITALS — BP 180/79 | HR 61 | Temp 97.3°F | Resp 20 | Ht 65.0 in | Wt 211.0 lb

## 2016-06-10 DIAGNOSIS — I6523 Occlusion and stenosis of bilateral carotid arteries: Secondary | ICD-10-CM

## 2016-06-10 DIAGNOSIS — I6529 Occlusion and stenosis of unspecified carotid artery: Secondary | ICD-10-CM

## 2016-06-10 LAB — VAS US CAROTID
LCCADDIAS: -67 cm/s
LCCADSYS: -462 cm/s
LEFT ECA DIAS: -47 cm/s
LEFT VERTEBRAL DIAS: -9 cm/s
LICADDIAS: -28 cm/s
LICADSYS: -84 cm/s
LICAPSYS: 139 cm/s
Left CCA prox dias: 16 cm/s
Left CCA prox sys: 85 cm/s
Left ICA prox dias: 35 cm/s
RCCADSYS: -90 cm/s
RIGHT CCA MID DIAS: -12 cm/s
RIGHT ECA DIAS: 13 cm/s
RIGHT VERTEBRAL DIAS: 13 cm/s
Right CCA prox dias: 16 cm/s
Right CCA prox sys: 116 cm/s

## 2016-06-10 NOTE — Progress Notes (Signed)
Patient name: Rhonda Mejia MRN: [id number] DOB: 1929-01-28 Sex: female  REASON FOR VISIT: Follow up of carotid disease.  HPI: Rhonda Mejia is a 82 y.o. female who I saw in consultation in the hospital in May of this year. A CT scan showed possibly 75% stenosis of the left internal carotid artery. However, the patient had calcific disease and I think this was not accurate. Duplex scan did not show any significant carotid disease. I set her up for a 6 month follow up visit.  Since I saw her last she denies any history of stroke, TIAs, expressive or receptive aphasia, or amaurosis fugax. She is on aspirin. She does not want to take a statin.  Past Medical History:  Diagnosis Date  . Cancer [facility])    left breast cancer  . Carotid artery occlusion   . Diabetes mellitus without complication (HCC)    diet controlled  . Family hx of colon cancer   . GERD (gastroesophageal reflux disease)   . High cholesterol   . History of gout   . Hypertension    for over 20 yrs  . Hypothyroid   . Neuropathy ([place])   . Pill dysphagia     Family History  Problem Relation Age of Onset  . Colon cancer Brother     SOCIAL HISTORY: Social History  Substance Use Topics  . Smoking status: Never Smoker  . Smokeless tobacco: Never Used  . Alcohol use No    Allergies  Allergen Reactions  . Penicillins     Rash   . Sulfa Antibiotics     Unknown reaction     Current Outpatient Prescriptions  Medication Sig Dispense Refill  . allopurinol (ZYLOPRIM) 300 MG tablet Take 300 mg by mouth daily.    Marland Kitchen aspirin EC 81 MG tablet Take 81 mg by mouth daily.    . Cholecalciferol (VITAMIN D3) 5000 units CAPS Take 1 capsule by mouth daily.    . Cyanocobalamin (B-12) 2500 MCG TABS Take 1 tablet by mouth daily.    Marland Kitchen docusate sodium (COLACE) 100 MG capsule Take 100 mg by mouth daily as needed for mild constipation.    . folic acid (FOLVITE) 1 MG tablet Take 1 mg by mouth daily.    . furosemide (LASIX) 40  MG tablet Take 1 tablet (40 mg total) by mouth daily. 90 tablet 3  . gabapentin (NEURONTIN) 300 MG capsule Take 300 mg by mouth 2 (two) times daily.  2  . hydrALAZINE (APRESOLINE) 25 MG tablet Take 25 mg by mouth 2 (two) times daily.    Marland Kitchen ibuprofen (ADVIL) 200 MG tablet Take 400 mg by mouth 2 (two) times daily as needed.    Marland Kitchen levothyroxine (SYNTHROID, LEVOTHROID) 100 MCG tablet Take 100 mcg by mouth daily.    Marland Kitchen lisinopril (PRINIVIL,ZESTRIL) 40 MG tablet Take 40 mg by mouth daily.    . metoprolol succinate (TOPROL-XL) 50 MG 24 hr tablet Take 50 mg by mouth daily.  6  . omeprazole (PRILOSEC) 40 MG capsule Take 40 mg by mouth daily.    Marland Kitchen pyridOXINE (VITAMIN B-6) 100 MG tablet Take 100 mg by mouth daily.    Marland Kitchen senna (SENOKOT) 8.6 MG tablet Take 1 tablet by mouth as needed for constipation.    . traMADol (ULTRAM) 50 MG tablet Take 50 mg by mouth every 6 (six) hours as needed for pain.    Marland Kitchen venlafaxine (EFFEXOR) 75 MG tablet Take 75 mg by mouth daily.     Marland Kitchen  atorvastatin (LIPITOR) 40 MG tablet Take 1 tablet (40 mg total) by mouth daily. (Patient not taking: Reported on 06/10/2016) 90 tablet 3  . potassium chloride (K-DUR,KLOR-CON) 10 MEQ tablet Take 10 mEq by mouth daily.  6   No current facility-administered medications for this visit.     REVIEW OF SYSTEMS:  [X]  denotes positive finding, [ ]  denotes negative finding Cardiac  Comments:  Chest pain or chest pressure:    Shortness of breath upon exertion:    Short of breath when lying flat:    Irregular heart rhythm:        Vascular    Pain in calf, thigh, or hip brought on by ambulation:    Pain in feet at night that wakes you up from your sleep:     Blood clot in your veins:    Leg swelling:  X Chronic bilateral       Pulmonary    Oxygen at home:    Productive cough:     Wheezing:  X       Neurologic    Sudden weakness in arms or legs:     Sudden numbness in arms or legs:     Sudden onset of difficulty speaking or slurred speech:      Temporary loss of vision in one eye:     Problems with dizziness:         Gastrointestinal    Blood in stool:     Vomited blood:         Genitourinary    Burning when urinating:     Blood in urine:        Psychiatric    Major depression:         Hematologic    Bleeding problems:    Problems with blood clotting too easily:        Skin    Rashes or ulcers:        Constitutional    Fever or chills:      PHYSICAL EXAM: Vitals:   06/10/16 1106 06/10/16 1112  BP: (!) 186/83 (!) 180/79  Pulse: 61   Resp: 20   Temp: 97.3 F (36.3 C)   TempSrc: Oral   SpO2: (!) 61%   Weight: 211 lb (95.7 kg)   Height: 5\' 5"  (1.651 m)     GENERAL: The patient is a well-nourished female, in no acute distress. The vital signs are documented above. CARDIAC: There is a regular rate and rhythm.  VASCULAR: She has a left carotid bruit. She has 2+ pitting edema bilaterally. Both feet are warm and well-perfused. PULMONARY: There is good air exchange bilaterally without wheezing or rales. ABDOMEN: Soft and non-tender with normal pitched bowel sounds.  MUSCULOSKELETAL: There are no major deformities or cyanosis. NEUROLOGIC: No focal weakness or paresthesias are detected. SKIN: There are no ulcers or rashes noted. PSYCHIATRIC: The patient has a normal affect.  DATA:   CAROTID DUPLEX: I have independently interpreted her carotid duplex scan today. She has a less than 39% internal carotid artery stenosis bilaterally. She does have a greater than 50% left external carotid artery stenosis. She does have elevated velocities in the distal left common carotid artery.  MEDICAL ISSUES:  BILATERAL CAROTID DISEASE: This patient has a less than 39% internal carotid artery stenosis bilaterally. There is some stenosis in the distal common carotid artery and the left that is moderate. She is asymptomatic. I think it is safe to stretcher follow up out to 1 year and  we will obtain a carotid duplex scan in 1 year  and have her see our nurse practitioner. She is on aspirin and knows to remain on that. She is not a smoker.  CHRONIC VENOUS INSUFFICIENCY: This patient does have chronic venous insufficiency. We have again discussed the importance of intermittent leg elevation in the proper positioning for this. She is also being treated with Lasix currently by her primary care physician.   Deitra Mayo [facility] [place] [phone]

## 2016-06-10 NOTE — Addendum Note (Signed)
Addended by: Lianne Cure A on: 06/10/2016 02:40 PM   Modules accepted: Orders

## 2016-06-15 DIAGNOSIS — E1151 Type 2 diabetes mellitus with diabetic peripheral angiopathy without gangrene: Secondary | ICD-10-CM | POA: Diagnosis not present

## 2016-06-15 DIAGNOSIS — E1140 Type 2 diabetes mellitus with diabetic neuropathy, unspecified: Secondary | ICD-10-CM | POA: Diagnosis not present

## 2016-06-15 DIAGNOSIS — M5136 Other intervertebral disc degeneration, lumbar region: Secondary | ICD-10-CM | POA: Diagnosis not present

## 2016-06-15 DIAGNOSIS — M47816 Spondylosis without myelopathy or radiculopathy, lumbar region: Secondary | ICD-10-CM | POA: Diagnosis not present

## 2016-06-15 DIAGNOSIS — M9903 Segmental and somatic dysfunction of lumbar region: Secondary | ICD-10-CM | POA: Diagnosis not present

## 2016-06-17 ENCOUNTER — Ambulatory Visit (INDEPENDENT_AMBULATORY_CARE_PROVIDER_SITE_OTHER): Payer: Self-pay | Admitting: Orthopaedic Surgery

## 2016-06-18 DIAGNOSIS — M9903 Segmental and somatic dysfunction of lumbar region: Secondary | ICD-10-CM | POA: Diagnosis not present

## 2016-06-18 DIAGNOSIS — M47816 Spondylosis without myelopathy or radiculopathy, lumbar region: Secondary | ICD-10-CM | POA: Diagnosis not present

## 2016-06-18 DIAGNOSIS — M5136 Other intervertebral disc degeneration, lumbar region: Secondary | ICD-10-CM | POA: Diagnosis not present

## 2016-06-19 DIAGNOSIS — Z6834 Body mass index (BMI) 34.0-34.9, adult: Secondary | ICD-10-CM | POA: Diagnosis not present

## 2016-06-19 DIAGNOSIS — Z713 Dietary counseling and surveillance: Secondary | ICD-10-CM | POA: Diagnosis not present

## 2016-06-19 DIAGNOSIS — R609 Edema, unspecified: Secondary | ICD-10-CM | POA: Diagnosis not present

## 2016-06-19 DIAGNOSIS — Z299 Encounter for prophylactic measures, unspecified: Secondary | ICD-10-CM | POA: Diagnosis not present

## 2016-06-22 DIAGNOSIS — M5136 Other intervertebral disc degeneration, lumbar region: Secondary | ICD-10-CM | POA: Diagnosis not present

## 2016-06-22 DIAGNOSIS — M47816 Spondylosis without myelopathy or radiculopathy, lumbar region: Secondary | ICD-10-CM | POA: Diagnosis not present

## 2016-06-22 DIAGNOSIS — M9903 Segmental and somatic dysfunction of lumbar region: Secondary | ICD-10-CM | POA: Diagnosis not present

## 2016-06-25 DIAGNOSIS — E119 Type 2 diabetes mellitus without complications: Secondary | ICD-10-CM | POA: Diagnosis not present

## 2016-06-25 DIAGNOSIS — I10 Essential (primary) hypertension: Secondary | ICD-10-CM | POA: Diagnosis not present

## 2016-06-26 DIAGNOSIS — M9903 Segmental and somatic dysfunction of lumbar region: Secondary | ICD-10-CM | POA: Diagnosis not present

## 2016-06-26 DIAGNOSIS — M47816 Spondylosis without myelopathy or radiculopathy, lumbar region: Secondary | ICD-10-CM | POA: Diagnosis not present

## 2016-06-26 DIAGNOSIS — M5136 Other intervertebral disc degeneration, lumbar region: Secondary | ICD-10-CM | POA: Diagnosis not present

## 2016-06-29 DIAGNOSIS — M47816 Spondylosis without myelopathy or radiculopathy, lumbar region: Secondary | ICD-10-CM | POA: Diagnosis not present

## 2016-06-29 DIAGNOSIS — M9903 Segmental and somatic dysfunction of lumbar region: Secondary | ICD-10-CM | POA: Diagnosis not present

## 2016-06-29 DIAGNOSIS — M5136 Other intervertebral disc degeneration, lumbar region: Secondary | ICD-10-CM | POA: Diagnosis not present

## 2016-07-02 DIAGNOSIS — M5136 Other intervertebral disc degeneration, lumbar region: Secondary | ICD-10-CM | POA: Diagnosis not present

## 2016-07-02 DIAGNOSIS — M47816 Spondylosis without myelopathy or radiculopathy, lumbar region: Secondary | ICD-10-CM | POA: Diagnosis not present

## 2016-07-02 DIAGNOSIS — M9903 Segmental and somatic dysfunction of lumbar region: Secondary | ICD-10-CM | POA: Diagnosis not present

## 2016-07-07 DIAGNOSIS — M5136 Other intervertebral disc degeneration, lumbar region: Secondary | ICD-10-CM | POA: Diagnosis not present

## 2016-07-07 DIAGNOSIS — M9903 Segmental and somatic dysfunction of lumbar region: Secondary | ICD-10-CM | POA: Diagnosis not present

## 2016-07-07 DIAGNOSIS — M47816 Spondylosis without myelopathy or radiculopathy, lumbar region: Secondary | ICD-10-CM | POA: Diagnosis not present

## 2016-07-10 ENCOUNTER — Ambulatory Visit: Payer: Medicare Other | Admitting: Cardiology

## 2016-07-15 DIAGNOSIS — M5136 Other intervertebral disc degeneration, lumbar region: Secondary | ICD-10-CM | POA: Diagnosis not present

## 2016-07-15 DIAGNOSIS — M9903 Segmental and somatic dysfunction of lumbar region: Secondary | ICD-10-CM | POA: Diagnosis not present

## 2016-07-15 DIAGNOSIS — M47816 Spondylosis without myelopathy or radiculopathy, lumbar region: Secondary | ICD-10-CM | POA: Diagnosis not present

## 2016-07-24 DIAGNOSIS — E119 Type 2 diabetes mellitus without complications: Secondary | ICD-10-CM | POA: Diagnosis not present

## 2016-07-24 DIAGNOSIS — I10 Essential (primary) hypertension: Secondary | ICD-10-CM | POA: Diagnosis not present

## 2016-08-18 ENCOUNTER — Ambulatory Visit: Payer: Medicare Other | Admitting: Cardiology

## 2016-08-19 ENCOUNTER — Ambulatory Visit (INDEPENDENT_AMBULATORY_CARE_PROVIDER_SITE_OTHER): Payer: Medicare Other | Admitting: Orthopaedic Surgery

## 2016-08-19 ENCOUNTER — Ambulatory Visit (INDEPENDENT_AMBULATORY_CARE_PROVIDER_SITE_OTHER): Payer: Self-pay

## 2016-08-19 DIAGNOSIS — G8929 Other chronic pain: Secondary | ICD-10-CM

## 2016-08-19 DIAGNOSIS — M25562 Pain in left knee: Secondary | ICD-10-CM

## 2016-08-19 DIAGNOSIS — M25561 Pain in right knee: Secondary | ICD-10-CM | POA: Diagnosis not present

## 2016-08-19 MED ORDER — LIDOCAINE HCL 1 % IJ SOLN
5.0000 mL | INTRAMUSCULAR | Status: AC | PRN
  Administered 2016-08-19: 5 mL

## 2016-08-19 MED ORDER — METHYLPREDNISOLONE ACETATE 40 MG/ML IJ SUSP
80.0000 mg | INTRAMUSCULAR | Status: AC | PRN
  Administered 2016-08-19: 80 mg

## 2016-08-19 MED ORDER — BUPIVACAINE HCL 0.5 % IJ SOLN
3.0000 mL | INTRAMUSCULAR | Status: AC | PRN
  Administered 2016-08-19: 3 mL via INTRA_ARTICULAR

## 2016-08-19 NOTE — Progress Notes (Signed)
Office Visit Note   Patient: Rhonda Mejia           Date of Birth: 06/20/1929           MRN: [id number] Visit Date: 08/19/2016              Requested by: Rhonda Blitz, MD [place], [place] [address] PCP: Rhonda Blitz, MD   Assessment & Plan: Visit Diagnoses: Bilateral knee pain consistent with osteoarthritis. Films demonstrate more changes on the right than the left.  Plan: Cortisone injection lateral compartment right knee. Follow-up in 2 weeks and consider injection left knee. Should consider Visco supplementation.  Follow-Up Instructions: No Follow-up on file.   Orders:  No orders of the defined types were placed in this encounter.  No orders of the defined types were placed in this encounter.     Procedures: Large Joint Inj Date/Time: 08/19/2016 11:32 AM Performed by: Rhonda Mejia Authorized by: Rhonda Mejia   Consent Given by:  Patient Timeout: prior to procedure the correct patient, procedure, and site was verified   Indications:  Pain and joint swelling Location:  Knee Site:  R knee Prep: patient was prepped and draped in usual sterile fashion   Needle Size:  25 G Needle Length:  1.5 inches Approach:  Anteromedial Ultrasound Guidance: No   Fluoroscopic Guidance: No   Arthrogram: No   Medications:  5 mL lidocaine 1 %; 80 mg methylPREDNISolone acetate 40 MG/ML; 3 mL bupivacaine 0.5 % Aspiration Attempted: No   Patient tolerance:  Patient tolerated the procedure well with no immediate complications     Clinical Data: No additional findings.   Subjective: Chief Complaint  Patient presents with  . Left Knee - Pain  Rhonda Mejia returns to the office for evaluation of bilateral knee pain. She's had chronic swelling of both lower extremities for a long period of time and does wear support stockings. I've seen her in the past for problems with both knees with evidence of some arthritis on the right than the left. She denies any history of  injury or trauma. She's reached a  Point on occasion that she certainly has compromise of her activities. She also has chronic back pain and is being followed by Rhonda Mejia  HPI  Review of Systems   Objective: Vital Signs: There were no vitals taken for this visit.  Physical Exam  Ortho Exam large knees. More pain in right than left knee particularly in the lateral compartment. No patella crepitation. Full extension bilaterally. Flexion to over 100 bilaterally. Bilateral nonpitting lower extremity edema. Good pulses. Increased valgus with weightbearing right knee. No instability bilaterally.  Specialty Comments:  No specialty comments available.  Imaging: No results found.   PMFS History: Patient Active Problem List   Diagnosis Date Noted  . Dysphagia, unspecified(787.20) 06/07/2013  . Unspecified hypothyroidism 10/10/2012  . High cholesterol 10/10/2012  . Essential hypertension, benign 10/10/2012  . Rectal bleeding 10/10/2012  . Pill dysphagia 10/10/2012  . Family hx of colon cancer 10/10/2012  . Anemia 10/10/2012   Past Medical History:  Diagnosis Date  . Cancer [facility])    left breast cancer  . Carotid artery occlusion   . Diabetes mellitus without complication (HCC)    diet controlled  . Family hx of colon cancer   . GERD (gastroesophageal reflux disease)   . High cholesterol   . History of gout   . Hypertension    for over 20 yrs  . Hypothyroid   .  Neuropathy ([place])   . Pill dysphagia     Family History  Problem Relation Age of Onset  . Colon cancer Brother     Past Surgical History:  Procedure Laterality Date  . BACK SURGERY     5 rods and 5 screws and cage in back, has had 4 back surgeries.  Marland Kitchen BREAST LUMPECTOMY     left breast  . CATARACT EXTRACTION W/PHACO Left 12/31/2014   Procedure: CATARACT EXTRACTION PHACO AND INTRAOCULAR LENS PLACEMENT (IOC);  Surgeon: Rhonda Branch, MD;  Location: [facility];  Service: Ophthalmology;  Laterality: Left;  CDE:8.45  .  CATARACT EXTRACTION W/PHACO Right 01/28/2015   Procedure: CATARACT EXTRACTION PHACO AND INTRAOCULAR LENS PLACEMENT RIGHT EYE;  Surgeon: Rhonda Branch, MD;  Location: [facility];  Service: Ophthalmology;  Laterality: Right;  CDE:4.92  . CHOLECYSTECTOMY    . COLONOSCOPY N/A 10/21/2012   Procedure: COLONOSCOPY;  Surgeon: Rhonda Houston, MD;  [facility];  Service: Endoscopy;  Laterality: N/A;  . ESOPHAGOGASTRODUODENOSCOPY (EGD) WITH ESOPHAGEAL DILATION N/A 06/09/2013   Procedure: ESOPHAGOGASTRODUODENOSCOPY (EGD) WITH ESOPHAGEAL DILATION;  Surgeon: Rhonda Houston, MD;  [facility];  Service: Endoscopy;  Laterality: N/A;  125  . KNEE SURGERY Right    arthroscopy x2  . SPINE SURGERY     3 surgeries by Dr. Jenne Mejia,  Last surgery 2 years ago by Rhonda Mejia in [place] History  . Not on file.   Social History Main Topics  . Smoking status: Never Smoker  . Smokeless tobacco: Never Used  . Alcohol use No  . Drug use: No  . Sexual activity: Yes    Birth control/ protection: Post-menopausal

## 2016-08-24 DIAGNOSIS — E1151 Type 2 diabetes mellitus with diabetic peripheral angiopathy without gangrene: Secondary | ICD-10-CM | POA: Diagnosis not present

## 2016-08-24 DIAGNOSIS — E1140 Type 2 diabetes mellitus with diabetic neuropathy, unspecified: Secondary | ICD-10-CM | POA: Diagnosis not present

## 2016-08-25 DIAGNOSIS — I10 Essential (primary) hypertension: Secondary | ICD-10-CM | POA: Diagnosis not present

## 2016-08-25 DIAGNOSIS — E119 Type 2 diabetes mellitus without complications: Secondary | ICD-10-CM | POA: Diagnosis not present

## 2016-09-01 DIAGNOSIS — Z789 Other specified health status: Secondary | ICD-10-CM | POA: Diagnosis not present

## 2016-09-01 DIAGNOSIS — E349 Endocrine disorder, unspecified: Secondary | ICD-10-CM | POA: Diagnosis not present

## 2016-09-01 DIAGNOSIS — I6529 Occlusion and stenosis of unspecified carotid artery: Secondary | ICD-10-CM | POA: Diagnosis not present

## 2016-09-01 DIAGNOSIS — K219 Gastro-esophageal reflux disease without esophagitis: Secondary | ICD-10-CM | POA: Diagnosis not present

## 2016-09-01 DIAGNOSIS — N183 Chronic kidney disease, stage 3 (moderate): Secondary | ICD-10-CM | POA: Diagnosis not present

## 2016-09-01 DIAGNOSIS — Z713 Dietary counseling and surveillance: Secondary | ICD-10-CM | POA: Diagnosis not present

## 2016-09-01 DIAGNOSIS — Z6835 Body mass index (BMI) 35.0-35.9, adult: Secondary | ICD-10-CM | POA: Diagnosis not present

## 2016-09-01 DIAGNOSIS — M109 Gout, unspecified: Secondary | ICD-10-CM | POA: Diagnosis not present

## 2016-09-01 DIAGNOSIS — E1142 Type 2 diabetes mellitus with diabetic polyneuropathy: Secondary | ICD-10-CM | POA: Diagnosis not present

## 2016-09-01 DIAGNOSIS — Z299 Encounter for prophylactic measures, unspecified: Secondary | ICD-10-CM | POA: Diagnosis not present

## 2016-09-01 DIAGNOSIS — E1122 Type 2 diabetes mellitus with diabetic chronic kidney disease: Secondary | ICD-10-CM | POA: Diagnosis not present

## 2016-09-01 DIAGNOSIS — E559 Vitamin D deficiency, unspecified: Secondary | ICD-10-CM | POA: Diagnosis not present

## 2016-09-02 ENCOUNTER — Ambulatory Visit (INDEPENDENT_AMBULATORY_CARE_PROVIDER_SITE_OTHER): Payer: Medicare Other | Admitting: Orthopaedic Surgery

## 2016-09-02 ENCOUNTER — Encounter (INDEPENDENT_AMBULATORY_CARE_PROVIDER_SITE_OTHER): Payer: Self-pay | Admitting: Orthopaedic Surgery

## 2016-09-02 VITALS — BP 144/67 | HR 75 | Ht 65.0 in | Wt 211.0 lb

## 2016-09-02 DIAGNOSIS — M25562 Pain in left knee: Secondary | ICD-10-CM

## 2016-09-02 DIAGNOSIS — G8929 Other chronic pain: Secondary | ICD-10-CM

## 2016-09-02 MED ORDER — LIDOCAINE HCL 1 % IJ SOLN
5.0000 mL | INTRAMUSCULAR | Status: AC | PRN
  Administered 2016-09-02: 5 mL

## 2016-09-02 MED ORDER — BUPIVACAINE HCL 0.5 % IJ SOLN
3.0000 mL | INTRAMUSCULAR | Status: AC | PRN
  Administered 2016-09-02: 3 mL via INTRA_ARTICULAR

## 2016-09-02 MED ORDER — METHYLPREDNISOLONE ACETATE 40 MG/ML IJ SUSP
80.0000 mg | INTRAMUSCULAR | Status: AC | PRN
  Administered 2016-09-02: 80 mg

## 2016-09-02 NOTE — Progress Notes (Signed)
Office Visit Note   Patient: Rhonda Mejia           Date of Birth: 1929-06-06           MRN: [id number] Visit Date: 09/02/2016              Requested by: Monico Blitz, MD [place], [place] [address] PCP: Monico Blitz, MD   Assessment & Plan: Visit Diagnoses:  1. Chronic pain of left knee    Osteoarthritis bilateral knees. Plan: Mrs. Govert is had cortisone injection of both knees in the past. She recently had a cortisone injection the right knee with some relief. She's having more trouble now on the left and wishes to proceed with another cortisone injection  Follow-Up Instructions: Return if symptoms worsen or fail to improve.   Orders:  No orders of the defined types were placed in this encounter.  No orders of the defined types were placed in this encounter.     Procedures: Large Joint Inj Date/Time: 09/02/2016 12:50 PM Performed by: Garald Balding Authorized by: Garald Balding   Consent Given by:  Patient Timeout: prior to procedure the correct patient, procedure, and site was verified   Indications:  Pain and joint swelling Location:  Knee Site:  L knee Prep: patient was prepped and draped in usual sterile fashion   Needle Size:  25 G Needle Length:  1.5 inches Approach:  Anteromedial Ultrasound Guidance: No   Fluoroscopic Guidance: No   Arthrogram: No   Medications:  5 mL lidocaine 1 %; 80 mg methylPREDNISolone acetate 40 MG/ML; 3 mL bupivacaine 0.5 % Aspiration Attempted: No   Patient tolerance:  Patient tolerated the procedure well with no immediate complications     Clinical Data: No additional findings.   Subjective: Chief Complaint  Patient presents with  . Right Knee - Pain    No relief with injection 2 weeks ago  . Left Knee - Pain    Requesting injection left knee    HPI Established history of bilateral knee osteoarthritis. Today this is Rhonda Mejia is more symptomatic on the left. The left knee was minimally effused.  Predominantly medial joint pain. Some patellar crepitation. Full extension and flexion about 100. No instability. Review of Systems   Objective: Vital Signs: BP (!) 144/67   Pulse 75   Ht 5\' 5"  (1.651 m)   Wt 211 lb (95.7 kg)   BMI 35.11 kg/m   Physical Exam  Ortho Exam as above with minimal effusion left knee. Her knee was not hot or red.  Specialty Comments:  No specialty comments available.  Imaging: No results found.   PMFS History: Patient Active Problem List   Diagnosis Date Noted  . Dysphagia, unspecified(787.20) 06/07/2013  . Unspecified hypothyroidism 10/10/2012  . High cholesterol 10/10/2012  . Essential hypertension, benign 10/10/2012  . Rectal bleeding 10/10/2012  . Pill dysphagia 10/10/2012  . Family hx of colon cancer 10/10/2012  . Anemia 10/10/2012   Past Medical History:  Diagnosis Date  . Cancer [facility])    left breast cancer  . Carotid artery occlusion   . Diabetes mellitus without complication (HCC)    diet controlled  . Family hx of colon cancer   . GERD (gastroesophageal reflux disease)   . High cholesterol   . History of gout   . Hypertension    for over 20 yrs  . Hypothyroid   . Neuropathy ([place])   . Pill dysphagia     Family History  Problem Relation Age of Onset  . Colon cancer Brother     Past Surgical History:  Procedure Laterality Date  . BACK SURGERY     5 rods and 5 screws and cage in back, has had 4 back surgeries.  Marland Kitchen BREAST LUMPECTOMY     left breast  . CATARACT EXTRACTION W/PHACO Left 12/31/2014   Procedure: CATARACT EXTRACTION PHACO AND INTRAOCULAR LENS PLACEMENT (IOC);  Surgeon: Tonny Branch, MD;  Location: [facility];  Service: Ophthalmology;  Laterality: Left;  CDE:8.45  . CATARACT EXTRACTION W/PHACO Right 01/28/2015   Procedure: CATARACT EXTRACTION PHACO AND INTRAOCULAR LENS PLACEMENT RIGHT EYE;  Surgeon: Tonny Branch, MD;  Location: [facility];  Service: Ophthalmology;  Laterality: Right;  CDE:4.92  . CHOLECYSTECTOMY    .  COLONOSCOPY N/A 10/21/2012   Procedure: COLONOSCOPY;  Surgeon: Rogene Houston, MD;  [facility];  Service: Endoscopy;  Laterality: N/A;  . ESOPHAGOGASTRODUODENOSCOPY (EGD) WITH ESOPHAGEAL DILATION N/A 06/09/2013   Procedure: ESOPHAGOGASTRODUODENOSCOPY (EGD) WITH ESOPHAGEAL DILATION;  Surgeon: Rogene Houston, MD;  [facility];  Service: Endoscopy;  Laterality: N/A;  125  . KNEE SURGERY Right    arthroscopy x2  . SPINE SURGERY     3 surgeries by Dr. Jenne Campus,  Last surgery 2 years ago by Dr. Carloyn Manner in [place] History  . Not on file.   Social History Main Topics  . Smoking status: Never Smoker  . Smokeless tobacco: Never Used  . Alcohol use No  . Drug use: No  . Sexual activity: Yes    Birth control/ protection: Post-menopausal

## 2016-09-04 DIAGNOSIS — M47816 Spondylosis without myelopathy or radiculopathy, lumbar region: Secondary | ICD-10-CM | POA: Diagnosis not present

## 2016-09-04 DIAGNOSIS — M5124 Other intervertebral disc displacement, thoracic region: Secondary | ICD-10-CM | POA: Diagnosis not present

## 2016-09-04 DIAGNOSIS — M5126 Other intervertebral disc displacement, lumbar region: Secondary | ICD-10-CM | POA: Diagnosis not present

## 2016-09-14 DIAGNOSIS — Z853 Personal history of malignant neoplasm of breast: Secondary | ICD-10-CM | POA: Diagnosis not present

## 2016-09-14 DIAGNOSIS — M545 Low back pain: Secondary | ICD-10-CM | POA: Diagnosis not present

## 2016-09-14 DIAGNOSIS — M47816 Spondylosis without myelopathy or radiculopathy, lumbar region: Secondary | ICD-10-CM | POA: Diagnosis not present

## 2016-09-14 DIAGNOSIS — M4807 Spinal stenosis, lumbosacral region: Secondary | ICD-10-CM | POA: Diagnosis not present

## 2016-09-14 DIAGNOSIS — R531 Weakness: Secondary | ICD-10-CM | POA: Diagnosis not present

## 2016-09-14 DIAGNOSIS — M5125 Other intervertebral disc displacement, thoracolumbar region: Secondary | ICD-10-CM | POA: Diagnosis not present

## 2016-09-14 DIAGNOSIS — M79604 Pain in right leg: Secondary | ICD-10-CM | POA: Diagnosis not present

## 2016-09-14 DIAGNOSIS — M79605 Pain in left leg: Secondary | ICD-10-CM | POA: Diagnosis not present

## 2016-09-14 DIAGNOSIS — Z981 Arthrodesis status: Secondary | ICD-10-CM | POA: Diagnosis not present

## 2016-09-14 DIAGNOSIS — M5127 Other intervertebral disc displacement, lumbosacral region: Secondary | ICD-10-CM | POA: Diagnosis not present

## 2016-09-15 DIAGNOSIS — Z299 Encounter for prophylactic measures, unspecified: Secondary | ICD-10-CM | POA: Diagnosis not present

## 2016-09-15 DIAGNOSIS — N183 Chronic kidney disease, stage 3 (moderate): Secondary | ICD-10-CM | POA: Diagnosis not present

## 2016-09-15 DIAGNOSIS — Z713 Dietary counseling and surveillance: Secondary | ICD-10-CM | POA: Diagnosis not present

## 2016-09-15 DIAGNOSIS — Z6834 Body mass index (BMI) 34.0-34.9, adult: Secondary | ICD-10-CM | POA: Diagnosis not present

## 2016-09-15 DIAGNOSIS — E1122 Type 2 diabetes mellitus with diabetic chronic kidney disease: Secondary | ICD-10-CM | POA: Diagnosis not present

## 2016-09-15 DIAGNOSIS — Z789 Other specified health status: Secondary | ICD-10-CM | POA: Diagnosis not present

## 2016-09-23 DIAGNOSIS — M5126 Other intervertebral disc displacement, lumbar region: Secondary | ICD-10-CM | POA: Diagnosis not present

## 2016-09-23 DIAGNOSIS — M5124 Other intervertebral disc displacement, thoracic region: Secondary | ICD-10-CM | POA: Diagnosis not present

## 2016-10-05 DIAGNOSIS — N183 Chronic kidney disease, stage 3 (moderate): Secondary | ICD-10-CM | POA: Diagnosis not present

## 2016-10-05 DIAGNOSIS — R809 Proteinuria, unspecified: Secondary | ICD-10-CM | POA: Diagnosis not present

## 2016-10-05 DIAGNOSIS — I509 Heart failure, unspecified: Secondary | ICD-10-CM | POA: Diagnosis not present

## 2016-10-08 ENCOUNTER — Encounter: Payer: Self-pay | Admitting: Cardiology

## 2016-10-08 ENCOUNTER — Ambulatory Visit (INDEPENDENT_AMBULATORY_CARE_PROVIDER_SITE_OTHER): Payer: Medicare Other | Admitting: Cardiology

## 2016-10-08 VITALS — BP 178/70 | HR 69 | Ht 65.0 in | Wt 213.6 lb

## 2016-10-08 DIAGNOSIS — I6523 Occlusion and stenosis of bilateral carotid arteries: Secondary | ICD-10-CM

## 2016-10-08 DIAGNOSIS — R600 Localized edema: Secondary | ICD-10-CM

## 2016-10-08 DIAGNOSIS — I10 Essential (primary) hypertension: Secondary | ICD-10-CM | POA: Diagnosis not present

## 2016-10-08 DIAGNOSIS — R1310 Dysphagia, unspecified: Secondary | ICD-10-CM

## 2016-10-08 NOTE — Patient Instructions (Signed)
Your physician wants you to follow-up in: [place] DR. BRANCH. You will receive a reminder letter in the mail two months in advance. If you don't receive a letter, please call our office to schedule the follow-up appointment.  Your physician recommends that you continue on your current medications as directed. Please refer to the Current Medication list given to you today.  You have been referred to DR. [facility]

## 2016-10-08 NOTE — Progress Notes (Signed)
Clinical Summary Ms. Dechaine is a 83 y.o.female seen today for follow up of the following medical problems.  1. HTN - compliant with meds. She reports normal bp at her recent pcp visit    2. LE edema - echo 02/2015 difficult study ,with LVEF 50-55%, normal RV function. Diastolic function not described.  - norvasc stopped at last visit due to LE edema. Lasix increased to 40mg  daily. Edema has improved  - repeat echo 09/2015 LVEF 60-65%, abnormal diastolic function grade indeterminate   - followed by vascular for chronic venous insufficiency - improved with leg compressoins.   3. Carotid stenosis - carotid US 09/2015 with concern for distal left CCA stenosis, 1-39% right ICA stenosis.  - 11/2015 CTA neck with 33% LICA stenosis, focal contained dissecion left ICA, no significant RICA disease - followed by vascular. From there consultation CTA results thought to be misleading due to heavy calficications and fairly mild velocities by carotid US.   4. Back pain - followed by Dr Carloyn Manner - considering repeat back surgery  5. DM2 -she is  diet controlled, followed by pcp  6. Dysphagia - feeling of food and pills getting stuck   Past Medical History:  Diagnosis Date  . Cancer [facility])    left breast cancer  . Carotid artery occlusion   . Diabetes mellitus without complication (HCC)    diet controlled  . Family hx of colon cancer   . GERD (gastroesophageal reflux disease)   . High cholesterol   . History of gout   . Hypertension    for over 20 yrs  . Hypothyroid   . Neuropathy ([place])   . Pill dysphagia      Allergies  Allergen Reactions  . Penicillins     Rash   . Sulfa Antibiotics     Unknown reaction      Current Outpatient Prescriptions  Medication Sig Dispense Refill  . allopurinol (ZYLOPRIM) 300 MG tablet Take 300 mg by mouth daily.    Marland Kitchen aspirin EC 81 MG tablet Take 81 mg by mouth daily.    Marland Kitchen atorvastatin (LIPITOR) 40 MG tablet Take 1 tablet (40 mg  total) by mouth daily. (Patient not taking: Reported on 06/10/2016) 90 tablet 3  . Cholecalciferol (VITAMIN D3) 5000 units CAPS Take 1 capsule by mouth daily.    . Cyanocobalamin (B-12) 2500 MCG TABS Take 1 tablet by mouth daily.    Marland Kitchen docusate sodium (COLACE) 100 MG capsule Take 100 mg by mouth daily as needed for mild constipation.    . folic acid (FOLVITE) 1 MG tablet Take 1 mg by mouth daily.    . furosemide (LASIX) 40 MG tablet Take 1 tablet (40 mg total) by mouth daily. 90 tablet 3  . gabapentin (NEURONTIN) 300 MG capsule Take 300 mg by mouth 2 (two) times daily.  2  . hydrALAZINE (APRESOLINE) 25 MG tablet Take 25 mg by mouth 2 (two) times daily.    Marland Kitchen ibuprofen (ADVIL) 200 MG tablet Take 400 mg by mouth 2 (two) times daily as needed.    Marland Kitchen levothyroxine (SYNTHROID, LEVOTHROID) 100 MCG tablet Take 100 mcg by mouth daily.    Marland Kitchen lisinopril (PRINIVIL,ZESTRIL) 40 MG tablet Take 40 mg by mouth daily.    . metoprolol succinate (TOPROL-XL) 50 MG 24 hr tablet Take 50 mg by mouth daily.  6  . omeprazole (PRILOSEC) 40 MG capsule Take 40 mg by mouth daily.    . potassium chloride (K-DUR,KLOR-CON) 10 MEQ tablet  Take 10 mEq by mouth daily.  6  . pyridOXINE (VITAMIN B-6) 100 MG tablet Take 100 mg by mouth daily.    Marland Kitchen senna (SENOKOT) 8.6 MG tablet Take 1 tablet by mouth as needed for constipation.    . traMADol (ULTRAM) 50 MG tablet Take 50 mg by mouth every 6 (six) hours as needed for pain.    Marland Kitchen venlafaxine (EFFEXOR) 75 MG tablet Take 75 mg by mouth daily.      No current facility-administered medications for this visit.      Past Surgical History:  Procedure Laterality Date  . BACK SURGERY     5 rods and 5 screws and cage in back, has had 4 back surgeries.  Marland Kitchen BREAST LUMPECTOMY     left breast  . CATARACT EXTRACTION W/PHACO Left 12/31/2014   Procedure: CATARACT EXTRACTION PHACO AND INTRAOCULAR LENS PLACEMENT (IOC);  Surgeon: Tonny Manuelita Moxon, MD;  Location: [facility];  Service: Ophthalmology;  Laterality:  Left;  CDE:8.45  . CATARACT EXTRACTION W/PHACO Right 01/28/2015   Procedure: CATARACT EXTRACTION PHACO AND INTRAOCULAR LENS PLACEMENT RIGHT EYE;  Surgeon: Tonny Tabbitha Janvrin, MD;  Location: [facility];  Service: Ophthalmology;  Laterality: Right;  CDE:4.92  . CHOLECYSTECTOMY    . COLONOSCOPY N/A 10/21/2012   Procedure: COLONOSCOPY;  Surgeon: Rogene Houston, MD;  [facility];  Service: Endoscopy;  Laterality: N/A;  . ESOPHAGOGASTRODUODENOSCOPY (EGD) WITH ESOPHAGEAL DILATION N/A 06/09/2013   Procedure: ESOPHAGOGASTRODUODENOSCOPY (EGD) WITH ESOPHAGEAL DILATION;  Surgeon: Rogene Houston, MD;  [facility];  Service: Endoscopy;  Laterality: N/A;  125  . KNEE SURGERY Right    arthroscopy x2  . SPINE SURGERY     3 surgeries by Dr. Jenne Campus,  Last surgery 2 years ago by Dr. Carloyn Manner in [place] Reactions  . Penicillins     Rash   . Sulfa Antibiotics     Unknown reaction       Family History  Problem Relation Age of Onset  . Colon cancer Brother      Social History Ms. Ingber reports that she has never smoked. She has never used smokeless tobacco. Ms. Kizziah reports that she does not drink alcohol.   Review of Systems CONSTITUTIONAL: No weight loss, fever, chills, weakness or fatigue.  HEENT: Eyes: No visual loss, blurred vision, double vision or yellow sclerae.No hearing loss, sneezing, congestion, runny nose or sore throat.  SKIN: No rash or itching.  CARDIOVASCULAR: per hpi RESPIRATORY: No shortness of breath, cough or sputum.  GASTROINTESTINAL: No anorexia, nausea, vomiting or diarrhea. No abdominal pain or blood.  GENITOURINARY: No burning on urination, no polyuria NEUROLOGICAL: No headache, dizziness, syncope, paralysis, ataxia, numbness or tingling in the extremities. No change in bowel or bladder control.  MUSCULOSKELETAL: No muscle, back pain, joint pain or stiffness.  LYMPHATICS: No enlarged nodes. No history of splenectomy.    PSYCHIATRIC: No history of depression or anxiety.  ENDOCRINOLOGIC: No reports of sweating, cold or heat intolerance. No polyuria or polydipsia.  Marland Kitchen   Physical Examination Vitals:   10/08/16 1425  BP: (!) 178/70  Pulse: 69   Vitals:   10/08/16 1425  Weight: 213 lb 9.6 oz (96.9 kg)  Height: 5\' 5"  (1.651 m)    Gen: resting comfortably, no acute distress HEENT: no scleral icterus, pupils equal round and reactive, no palptable cervical adenopathy,  CV: RRR, no m/r/g, no jvd Resp: Clear to auscultation bilaterally GI: abdomen is soft, non-tender, non-distended,  normal bowel sounds, no hepatosplenomegaly MSK: extremities are warm, no edema.  Skin: warm, no rash Neuro:  no focal deficits Psych: appropriate affect   Diagnostic Studies 09/2015 echo Study Conclusions  - Left ventricle: The cavity size was normal. Wall thickness was  increased in a pattern of mild LVH. Systolic function was normal.  The estimated ejection fraction was in the range of 60% to 65%.  Diastolic function is abnormal, indeterminate grade. Wall motion  was normal; there were no regional wall motion abnormalities. - Aortic valve: Mildly calcified annulus. Trileaflet; mildly  thickened leaflets. Valve area (VTI): 1.52 cm^2. Valve area  (Vmax): 1.43 cm^2. - Mitral valve: Mildly calcified annulus. Normal thickness leaflets  . - Technically adequate study.   11/2015 CTA neck IMPRESSION: 1. 75% stenosis of the left internal carotid artery at the bifurcation secondary to prominent posterior lateral soft tissue neck calcified plaque. 2. Atherosclerotic calcifications at the right carotid bifurcation and origins the great vessels without other focal stenoses. 3. 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 4. Multilevel spondylosis of the cervical spine is most pronounced at C4-5 and C5-6.    Assessment and Plan  1. HTN  - elevated in  clinic, suspect some component of white coat HTN. Previous appointments she has been well controlled - monitor at this time, follow bp trend.   2. LE edema - continue diuretics and compressoin stockings.   3. Carotid stenosis - continue to monitor. Followed by vascular  4. Dysphagia - refer to GI   F/u 6 months      Arnoldo Lenis, M.D.

## 2016-10-12 DIAGNOSIS — I10 Essential (primary) hypertension: Secondary | ICD-10-CM | POA: Diagnosis not present

## 2016-10-12 DIAGNOSIS — M109 Gout, unspecified: Secondary | ICD-10-CM | POA: Diagnosis not present

## 2016-10-12 DIAGNOSIS — S0003XA Contusion of scalp, initial encounter: Secondary | ICD-10-CM | POA: Diagnosis not present

## 2016-10-12 DIAGNOSIS — Z7982 Long term (current) use of aspirin: Secondary | ICD-10-CM | POA: Diagnosis not present

## 2016-10-12 DIAGNOSIS — E039 Hypothyroidism, unspecified: Secondary | ICD-10-CM | POA: Diagnosis not present

## 2016-10-12 DIAGNOSIS — E1140 Type 2 diabetes mellitus with diabetic neuropathy, unspecified: Secondary | ICD-10-CM | POA: Diagnosis not present

## 2016-10-12 DIAGNOSIS — R51 Headache: Secondary | ICD-10-CM | POA: Diagnosis not present

## 2016-10-12 DIAGNOSIS — W01198A Fall on same level from slipping, tripping and stumbling with subsequent striking against other object, initial encounter: Secondary | ICD-10-CM | POA: Diagnosis not present

## 2016-10-12 DIAGNOSIS — S0083XA Contusion of other part of head, initial encounter: Secondary | ICD-10-CM | POA: Diagnosis not present

## 2016-10-12 DIAGNOSIS — Z853 Personal history of malignant neoplasm of breast: Secondary | ICD-10-CM | POA: Diagnosis not present

## 2016-10-12 DIAGNOSIS — K219 Gastro-esophageal reflux disease without esophagitis: Secondary | ICD-10-CM | POA: Diagnosis not present

## 2016-10-12 DIAGNOSIS — Z79899 Other long term (current) drug therapy: Secondary | ICD-10-CM | POA: Diagnosis not present

## 2016-10-13 DIAGNOSIS — E119 Type 2 diabetes mellitus without complications: Secondary | ICD-10-CM | POA: Diagnosis not present

## 2016-10-13 DIAGNOSIS — I10 Essential (primary) hypertension: Secondary | ICD-10-CM | POA: Diagnosis not present

## 2016-10-21 DIAGNOSIS — Z1159 Encounter for screening for other viral diseases: Secondary | ICD-10-CM | POA: Diagnosis not present

## 2016-10-21 DIAGNOSIS — N183 Chronic kidney disease, stage 3 (moderate): Secondary | ICD-10-CM | POA: Diagnosis not present

## 2016-10-21 DIAGNOSIS — I129 Hypertensive chronic kidney disease with stage 1 through stage 4 chronic kidney disease, or unspecified chronic kidney disease: Secondary | ICD-10-CM | POA: Diagnosis not present

## 2016-10-21 DIAGNOSIS — E559 Vitamin D deficiency, unspecified: Secondary | ICD-10-CM | POA: Diagnosis not present

## 2016-10-21 DIAGNOSIS — R809 Proteinuria, unspecified: Secondary | ICD-10-CM | POA: Diagnosis not present

## 2016-10-21 DIAGNOSIS — D509 Iron deficiency anemia, unspecified: Secondary | ICD-10-CM | POA: Diagnosis not present

## 2016-10-21 DIAGNOSIS — Z79899 Other long term (current) drug therapy: Secondary | ICD-10-CM | POA: Diagnosis not present

## 2016-10-21 DIAGNOSIS — D519 Vitamin B12 deficiency anemia, unspecified: Secondary | ICD-10-CM | POA: Diagnosis not present

## 2016-10-21 DIAGNOSIS — N281 Cyst of kidney, acquired: Secondary | ICD-10-CM | POA: Diagnosis not present

## 2016-10-22 ENCOUNTER — Ambulatory Visit (INDEPENDENT_AMBULATORY_CARE_PROVIDER_SITE_OTHER): Payer: Medicare Other | Admitting: Internal Medicine

## 2016-10-26 ENCOUNTER — Encounter (INDEPENDENT_AMBULATORY_CARE_PROVIDER_SITE_OTHER): Payer: Self-pay | Admitting: Internal Medicine

## 2016-10-26 ENCOUNTER — Encounter (INDEPENDENT_AMBULATORY_CARE_PROVIDER_SITE_OTHER): Payer: Self-pay | Admitting: *Deleted

## 2016-10-26 ENCOUNTER — Ambulatory Visit (INDEPENDENT_AMBULATORY_CARE_PROVIDER_SITE_OTHER): Payer: Medicare Other | Admitting: Internal Medicine

## 2016-10-26 VITALS — BP 160/70 | HR 66 | Temp 97.6°F | Ht 66.0 in | Wt 210.6 lb

## 2016-10-26 DIAGNOSIS — N183 Chronic kidney disease, stage 3 (moderate): Secondary | ICD-10-CM | POA: Diagnosis not present

## 2016-10-26 DIAGNOSIS — Z299 Encounter for prophylactic measures, unspecified: Secondary | ICD-10-CM | POA: Diagnosis not present

## 2016-10-26 DIAGNOSIS — R1310 Dysphagia, unspecified: Secondary | ICD-10-CM | POA: Diagnosis not present

## 2016-10-26 DIAGNOSIS — Z6835 Body mass index (BMI) 35.0-35.9, adult: Secondary | ICD-10-CM | POA: Diagnosis not present

## 2016-10-26 DIAGNOSIS — R1319 Other dysphagia: Secondary | ICD-10-CM

## 2016-10-26 DIAGNOSIS — I6523 Occlusion and stenosis of bilateral carotid arteries: Secondary | ICD-10-CM

## 2016-10-26 DIAGNOSIS — I10 Essential (primary) hypertension: Secondary | ICD-10-CM | POA: Diagnosis not present

## 2016-10-26 DIAGNOSIS — E039 Hypothyroidism, unspecified: Secondary | ICD-10-CM | POA: Diagnosis not present

## 2016-10-26 DIAGNOSIS — E1122 Type 2 diabetes mellitus with diabetic chronic kidney disease: Secondary | ICD-10-CM | POA: Diagnosis not present

## 2016-10-26 NOTE — Progress Notes (Signed)
Subjective:    Patient ID: Rhonda Mejia, female    DOB: 1929/05/29, 83 y.o.   MRN: [id number]  HPI Referred by Dr. Manuella Ghazi for dysphagia. She states when she talks a long time she becomes hoarse. Also states sometimes she has dysphagia. If she takes a large pill, she will have to bend in head and swallow for the pill to go down.  She occasionally has trouble swallowing foods. Her appetite is good. No weight loss. She has a BM once every 2 days if [place].  No melena or BRRB Family hx of colon cancer in his 6s.   Her last EGD/ED was in 06/09/2013: dysphagia.  Impression: No evidence of erosive esophagitis ring or stricture formation. Small sliding hiatal hernia. Nonerosive antral gastritis. Esophagus dilated by passing 56 French Maloney dilator given history of solid food dysphagia.    Review of Systems Past Medical History:  Diagnosis Date  . Cancer [facility])    left breast cancer  . Carotid artery occlusion   . Diabetes mellitus without complication (HCC)    diet controlled  . Family hx of colon cancer   . GERD (gastroesophageal reflux disease)   . High cholesterol   . History of gout   . Hypertension    for over 20 yrs  . Hypothyroid   . Neuropathy ([place])   . Pill dysphagia     Past Surgical History:  Procedure Laterality Date  . BACK SURGERY     5 rods and 5 screws and cage in back, has had 4 back surgeries.  Marland Kitchen BREAST LUMPECTOMY     left breast  . CATARACT EXTRACTION W/PHACO Left 12/31/2014   Procedure: CATARACT EXTRACTION PHACO AND INTRAOCULAR LENS PLACEMENT (IOC);  Surgeon: Tonny Branch, MD;  Location: [facility];  Service: Ophthalmology;  Laterality: Left;  CDE:8.45  . CATARACT EXTRACTION W/PHACO Right 01/28/2015   Procedure: CATARACT EXTRACTION PHACO AND INTRAOCULAR LENS PLACEMENT RIGHT EYE;  Surgeon: Tonny Branch, MD;  Location: [facility];  Service: Ophthalmology;  Laterality: Right;  CDE:4.92  . CHOLECYSTECTOMY    . COLONOSCOPY N/A 10/21/2012   Procedure: COLONOSCOPY;   Surgeon: Rogene Houston, MD;  [facility];  Service: Endoscopy;  Laterality: N/A;  . ESOPHAGOGASTRODUODENOSCOPY (EGD) WITH ESOPHAGEAL DILATION N/A 06/09/2013   Procedure: ESOPHAGOGASTRODUODENOSCOPY (EGD) WITH ESOPHAGEAL DILATION;  Surgeon: Rogene Houston, MD;  [facility];  Service: Endoscopy;  Laterality: N/A;  125  . KNEE SURGERY Right    arthroscopy x2  . SPINE SURGERY     3 surgeries by Dr. Jenne Campus,  Last surgery 2 years ago by Dr. Carloyn Manner in [place] Reactions  . Lipitor [Atorvastatin] Other (See Comments)    Didn't agree with stomach  . Penicillins     Rash   . Sulfa Antibiotics     Unknown reaction     Current Outpatient Prescriptions on File Prior to Visit  Medication Sig Dispense Refill  . allopurinol (ZYLOPRIM) 300 MG tablet Take 300 mg by mouth daily.    Marland Kitchen aspirin EC 81 MG tablet Take 81 mg by mouth daily.    . Cyanocobalamin (B-12) 2500 MCG TABS Take 2 tablets by mouth daily.     Marland Kitchen docusate sodium (COLACE) 100 MG capsule Take 100 mg by mouth daily as needed for mild constipation.    . folic acid (FOLVITE) 1 MG tablet Take 1 mg by mouth daily.    . furosemide (LASIX) 40 MG tablet Take  20 mg by mouth daily.    Marland Kitchen gabapentin (NEURONTIN) 300 MG capsule Take 300 mg by mouth 2 (two) times daily.  2  . hydrALAZINE (APRESOLINE) 25 MG tablet Take 25 mg by mouth 2 (two) times daily.     Marland Kitchen levothyroxine (SYNTHROID, LEVOTHROID) 100 MCG tablet Take 100 mcg by mouth daily.    Marland Kitchen lisinopril (PRINIVIL,ZESTRIL) 40 MG tablet Take 40 mg by mouth daily.    . metoprolol succinate (TOPROL-XL) 50 MG 24 hr tablet Take 50 mg by mouth daily.  6  . Omega-3 Fatty Acids (FISH OIL PO) Take 1 capsule by mouth daily.    Marland Kitchen omeprazole (PRILOSEC) 40 MG capsule Take 40 mg by mouth daily.    Marland Kitchen pyridOXINE (VITAMIN B-6) 100 MG tablet Take 100 mg by mouth daily.    Marland Kitchen senna (SENOKOT) 8.6 MG tablet Take 1 tablet by mouth as needed for constipation.    . traMADol  (ULTRAM) 50 MG tablet Take 50 mg by mouth every 6 (six) hours as needed for pain.    Marland Kitchen venlafaxine (EFFEXOR) 75 MG tablet Take 75 mg by mouth daily.     Marland Kitchen atorvastatin (LIPITOR) 40 MG tablet Take 1 tablet (40 mg total) by mouth daily. (Patient not taking: Reported on 06/10/2016) 90 tablet 3   No current facility-administered medications on file prior to visit.           Objective:   Physical Exam Blood pressure (!) 160/70, pulse 66, temperature 97.6 F (36.4 C), height 5\' 6"  (1.676 m), weight 210 lb 9.6 oz (95.5 kg). .  Alert and oriented. Skin warm and dry. Oral mucosa is moist.   . Sclera anicteric, conjunctivae is pink. Thyroid not enlarged. No cervical lymphadenopathy. Lungs clear. Heart regular rate and rhythm.  Abdomen is soft. Bowel sounds are positive. No hepatomegaly. No abdominal masses felt. No tenderness. 2+ edema to lower extremities.        Assessment & Plan:  Dyshagia Am going get a DG Esophagram. Further recommendations to follow.

## 2016-10-26 NOTE — Patient Instructions (Signed)
DG esophagram.   

## 2016-10-28 ENCOUNTER — Ambulatory Visit (HOSPITAL_COMMUNITY): Payer: Medicare Other

## 2016-10-28 ENCOUNTER — Ambulatory Visit (INDEPENDENT_AMBULATORY_CARE_PROVIDER_SITE_OTHER): Payer: Medicare Other | Admitting: Orthopaedic Surgery

## 2016-10-28 ENCOUNTER — Encounter (INDEPENDENT_AMBULATORY_CARE_PROVIDER_SITE_OTHER): Payer: Self-pay | Admitting: Orthopaedic Surgery

## 2016-10-28 VITALS — BP 104/66 | HR 74 | Ht 65.0 in | Wt 211.0 lb

## 2016-10-28 DIAGNOSIS — Z1159 Encounter for screening for other viral diseases: Secondary | ICD-10-CM | POA: Diagnosis not present

## 2016-10-28 DIAGNOSIS — M25561 Pain in right knee: Secondary | ICD-10-CM

## 2016-10-28 DIAGNOSIS — M25562 Pain in left knee: Secondary | ICD-10-CM

## 2016-10-28 DIAGNOSIS — I6523 Occlusion and stenosis of bilateral carotid arteries: Secondary | ICD-10-CM | POA: Diagnosis not present

## 2016-10-28 DIAGNOSIS — D519 Vitamin B12 deficiency anemia, unspecified: Secondary | ICD-10-CM | POA: Diagnosis not present

## 2016-10-28 DIAGNOSIS — D509 Iron deficiency anemia, unspecified: Secondary | ICD-10-CM | POA: Diagnosis not present

## 2016-10-28 DIAGNOSIS — N183 Chronic kidney disease, stage 3 (moderate): Secondary | ICD-10-CM | POA: Diagnosis not present

## 2016-10-28 DIAGNOSIS — Z79899 Other long term (current) drug therapy: Secondary | ICD-10-CM | POA: Diagnosis not present

## 2016-10-28 DIAGNOSIS — G8929 Other chronic pain: Secondary | ICD-10-CM | POA: Diagnosis not present

## 2016-10-28 DIAGNOSIS — E559 Vitamin D deficiency, unspecified: Secondary | ICD-10-CM | POA: Diagnosis not present

## 2016-10-28 DIAGNOSIS — I129 Hypertensive chronic kidney disease with stage 1 through stage 4 chronic kidney disease, or unspecified chronic kidney disease: Secondary | ICD-10-CM | POA: Diagnosis not present

## 2016-10-28 DIAGNOSIS — R809 Proteinuria, unspecified: Secondary | ICD-10-CM | POA: Diagnosis not present

## 2016-10-28 MED ORDER — METHYLPREDNISOLONE ACETATE 40 MG/ML IJ SUSP
80.0000 mg | INTRAMUSCULAR | Status: AC | PRN
  Administered 2016-10-28: 80 mg

## 2016-10-28 MED ORDER — BUPIVACAINE HCL 0.5 % IJ SOLN
3.0000 mL | INTRAMUSCULAR | Status: AC | PRN
  Administered 2016-10-28: 3 mL via INTRA_ARTICULAR

## 2016-10-28 MED ORDER — LIDOCAINE HCL 1 % IJ SOLN
5.0000 mL | INTRAMUSCULAR | Status: AC | PRN
  Administered 2016-10-28: 5 mL

## 2016-10-28 NOTE — Progress Notes (Signed)
Office Visit Note   Patient: Rhonda Mejia           Date of Birth: 1929/04/16           MRN: [id number] Visit Date: 10/28/2016              Requested by: Monico Blitz, MD [place], [place] [address] PCP: Monico Blitz, MD   Assessment & Plan: Visit Diagnoses:  1. Chronic pain of both knees   Rhonda Mejia has a long history of osteoarthritis of both knees. She is having more trouble on the right than the left. She's had a number of cortisone injections in the past as well as viscose supplementation only to have recurrence of her pain. We have tried bracing on the right lower extremity was seems to help. She is just frustrated with her problem.  Plan: Repeat cortisone injection right knee. Long discussion over 30 minutes regarding her problem and different treatment options. I even discussed knee replacement. At C6 lives by herself and has a number of medical problems which may. Consider surgery but if she should ever consider knee replacement we would have her  see her family physician.  Follow-Up Instructions: Return if symptoms worsen or fail to improve.   Orders:  No orders of the defined types were placed in this encounter.  No orders of the defined types were placed in this encounter.     Procedures: Large Joint Inj Date/Time: 10/28/2016 12:01 PM Performed by: Garald Balding Authorized by: Garald Balding   Consent Given by:  Patient Timeout: prior to procedure the correct patient, procedure, and site was verified   Indications:  Pain and joint swelling Location:  Knee Site:  R knee Prep: patient was prepped and draped in usual sterile fashion   Needle Size:  25 G Needle Length:  1.5 inches Approach:  Anteromedial Ultrasound Guidance: No   Fluoroscopic Guidance: No   Arthrogram: No   Medications:  5 mL lidocaine 1 %; 80 mg methylPREDNISolone acetate 40 MG/ML; 3 mL bupivacaine 0.5 % Aspiration Attempted: No   Patient tolerance:  Patient tolerated the  procedure well with no immediate complications     Clinical Data: No additional findings.   Subjective: Chief Complaint  Patient presents with  . Right Knee - Pain    Osteoarthritis bilateral knees.  Rhonda Mejia is an 83 year old female that has osteoarthritis of both knees. She  had cortisone injection of both knees in the past. She  had a cortisone injection the right knee with some relief on 08/19/16. She does   . Left Knee - Pain   Rhonda Mejia is 83 years old and has had recurrent problems with the osteoarthritis in both of her knees for "many years". She's had prior cortisone injections and viscose supplementation only to have the pain recur. She's been using a brace on the right leg and oftentimes requires the use of a cane. She's also had back surgery through Dr. Kalman Shan office and is in the midst of obtaining a therapy for strengthening and for "balance." [employer]   Objective: Vital Signs: BP 104/66   Pulse 74   Ht 5\' 5"  (1.651 m)   Wt 211 lb (95.7 kg)   BMI 35.11 kg/m   Physical Exam  Ortho Exam  full extension of both knees with flexion to about 100. No instability. Mild swelling of ankles. More pain in right then  left knee and more painful laterally than medially.  Positive patellofemoral crepitation bilaterally. Good pulses distally. Painless range of motion of both hips. Straight leg raise negative.  Specialty Comments:  No specialty comments available.  Imaging: No results found.   PMFS History: Patient Active Problem List   Diagnosis Date Noted  . Dysphagia, unspecified(787.20) 06/07/2013  . Unspecified hypothyroidism 10/10/2012  . High cholesterol 10/10/2012  . Essential hypertension, benign 10/10/2012  . Rectal bleeding 10/10/2012  . Pill dysphagia 10/10/2012  . Family hx of colon cancer 10/10/2012  . Anemia 10/10/2012   Past Medical History:  Diagnosis Date  . Cancer [facility])    left breast cancer  . Carotid artery occlusion   .  Diabetes mellitus without complication (HCC)    diet controlled  . Family hx of colon cancer   . GERD (gastroesophageal reflux disease)   . High cholesterol   . History of gout   . Hypertension    for over 20 yrs  . Hypothyroid   . Neuropathy ([place])   . Pill dysphagia     Family History  Problem Relation Age of Onset  . Colon cancer Brother     Past Surgical History:  Procedure Laterality Date  . BACK SURGERY     5 rods and 5 screws and cage in back, has had 4 back surgeries.  Marland Kitchen BREAST LUMPECTOMY     left breast  . CATARACT EXTRACTION W/PHACO Left 12/31/2014   Procedure: CATARACT EXTRACTION PHACO AND INTRAOCULAR LENS PLACEMENT (IOC);  Surgeon: Tonny Branch, MD;  Location: [facility];  Service: Ophthalmology;  Laterality: Left;  CDE:8.45  . CATARACT EXTRACTION W/PHACO Right 01/28/2015   Procedure: CATARACT EXTRACTION PHACO AND INTRAOCULAR LENS PLACEMENT RIGHT EYE;  Surgeon: Tonny Branch, MD;  Location: [facility];  Service: Ophthalmology;  Laterality: Right;  CDE:4.92  . CHOLECYSTECTOMY    . COLONOSCOPY N/A 10/21/2012   Procedure: COLONOSCOPY;  Surgeon: Rogene Houston, MD;  [facility];  Service: Endoscopy;  Laterality: N/A;  . ESOPHAGOGASTRODUODENOSCOPY (EGD) WITH ESOPHAGEAL DILATION N/A 06/09/2013   Procedure: ESOPHAGOGASTRODUODENOSCOPY (EGD) WITH ESOPHAGEAL DILATION;  Surgeon: Rogene Houston, MD;  [facility];  Service: Endoscopy;  Laterality: N/A;  125  . KNEE SURGERY Right    arthroscopy x2  . SPINE SURGERY     3 surgeries by Dr. Jenne Campus,  Last surgery 2 years ago by Dr. Carloyn Manner in [place] History  . Not on file.   Social History Main Topics  . Smoking status: Never Smoker  . Smokeless tobacco: Never Used  . Alcohol use No  . Drug use: No  . Sexual activity: Yes    Birth control/ protection: Post-menopausal

## 2016-10-30 DIAGNOSIS — M5126 Other intervertebral disc displacement, lumbar region: Secondary | ICD-10-CM | POA: Diagnosis not present

## 2016-11-02 ENCOUNTER — Ambulatory Visit (HOSPITAL_COMMUNITY)
Admission: RE | Admit: 2016-11-02 | Discharge: 2016-11-02 | Disposition: A | Discharge status: Home / Self Care | Payer: Medicare Other | Source: Ambulatory Visit | Attending: Internal Medicine | Admitting: Internal Medicine

## 2016-11-02 DIAGNOSIS — K449 Diaphragmatic hernia without obstruction or gangrene: Secondary | ICD-10-CM | POA: Diagnosis not present

## 2016-11-02 DIAGNOSIS — R1319 Other dysphagia: Secondary | ICD-10-CM

## 2016-11-02 DIAGNOSIS — R1310 Dysphagia, unspecified: Secondary | ICD-10-CM | POA: Diagnosis not present

## 2016-11-04 DIAGNOSIS — M5126 Other intervertebral disc displacement, lumbar region: Secondary | ICD-10-CM | POA: Diagnosis not present

## 2016-11-06 DIAGNOSIS — M5126 Other intervertebral disc displacement, lumbar region: Secondary | ICD-10-CM | POA: Diagnosis not present

## 2016-11-09 DIAGNOSIS — Z6834 Body mass index (BMI) 34.0-34.9, adult: Secondary | ICD-10-CM | POA: Diagnosis not present

## 2016-11-09 DIAGNOSIS — E1165 Type 2 diabetes mellitus with hyperglycemia: Secondary | ICD-10-CM | POA: Diagnosis not present

## 2016-11-09 DIAGNOSIS — Z299 Encounter for prophylactic measures, unspecified: Secondary | ICD-10-CM | POA: Diagnosis not present

## 2016-11-09 DIAGNOSIS — E039 Hypothyroidism, unspecified: Secondary | ICD-10-CM | POA: Diagnosis not present

## 2016-11-09 DIAGNOSIS — E041 Nontoxic single thyroid nodule: Secondary | ICD-10-CM | POA: Diagnosis not present

## 2016-11-09 DIAGNOSIS — K219 Gastro-esophageal reflux disease without esophagitis: Secondary | ICD-10-CM | POA: Diagnosis not present

## 2016-11-09 DIAGNOSIS — N183 Chronic kidney disease, stage 3 (moderate): Secondary | ICD-10-CM | POA: Diagnosis not present

## 2016-11-09 DIAGNOSIS — E349 Endocrine disorder, unspecified: Secondary | ICD-10-CM | POA: Diagnosis not present

## 2016-11-09 DIAGNOSIS — M109 Gout, unspecified: Secondary | ICD-10-CM | POA: Diagnosis not present

## 2016-11-09 DIAGNOSIS — E1142 Type 2 diabetes mellitus with diabetic polyneuropathy: Secondary | ICD-10-CM | POA: Diagnosis not present

## 2016-11-09 DIAGNOSIS — E1122 Type 2 diabetes mellitus with diabetic chronic kidney disease: Secondary | ICD-10-CM | POA: Diagnosis not present

## 2016-11-09 DIAGNOSIS — I10 Essential (primary) hypertension: Secondary | ICD-10-CM | POA: Diagnosis not present

## 2016-11-11 ENCOUNTER — Ambulatory Visit (INDEPENDENT_AMBULATORY_CARE_PROVIDER_SITE_OTHER): Payer: Medicare Other | Admitting: Orthopaedic Surgery

## 2016-11-11 ENCOUNTER — Encounter (INDEPENDENT_AMBULATORY_CARE_PROVIDER_SITE_OTHER): Payer: Self-pay | Admitting: Orthopaedic Surgery

## 2016-11-11 VITALS — Ht 65.0 in | Wt 211.0 lb

## 2016-11-11 DIAGNOSIS — G8929 Other chronic pain: Secondary | ICD-10-CM

## 2016-11-11 DIAGNOSIS — M25562 Pain in left knee: Secondary | ICD-10-CM

## 2016-11-11 MED ORDER — BUPIVACAINE HCL 0.5 % IJ SOLN
3.0000 mL | INTRAMUSCULAR | Status: AC | PRN
  Administered 2016-11-11: 3 mL via INTRA_ARTICULAR

## 2016-11-11 MED ORDER — LIDOCAINE HCL 1 % IJ SOLN
5.0000 mL | INTRAMUSCULAR | Status: AC | PRN
  Administered 2016-11-11: 5 mL

## 2016-11-11 MED ORDER — METHYLPREDNISOLONE ACETATE 40 MG/ML IJ SUSP
80.0000 mg | INTRAMUSCULAR | Status: AC | PRN
  Administered 2016-11-11: 80 mg

## 2016-11-11 NOTE — Progress Notes (Signed)
Office Visit Note   Patient: Rhonda Mejia           Date of Birth: [birth year]           MRN: [id number] Visit Date: 11/11/2016              Requested by: Monico Blitz, MD [place], [place] [address] PCP: Monico Blitz, MD   Assessment & Plan: Visit Diagnoses:  1. Chronic pain of left knee   Bilateral knee osteoarthritis. Presently more symptomatic on the left  Plan: Attempted aspiration of popliteal cyst left knee no fluid retrieved, cortisone injection medial compartment left knee . office as needed  Follow-Up Instructions: Return if symptoms worsen or fail to improve.   Orders:  No orders of the defined types were placed in this encounter.  No orders of the defined types were placed in this encounter.     Procedures: Large Joint Inj Date/Time: 11/11/2016 3:28 PM Performed by: Garald Balding Authorized by: Garald Balding   Consent Given by:  Patient Timeout: prior to procedure the correct patient, procedure, and site was verified   Indications:  Pain and joint swelling Location:  Knee Site:  L knee Prep: patient was prepped and draped in usual sterile fashion   Needle Size:  25 G Needle Length:  1.5 inches Approach:  Anteromedial Ultrasound Guidance: No   Fluoroscopic Guidance: No   Arthrogram: No   Medications:  5 mL lidocaine 1 %; 80 mg methylPREDNISolone acetate 40 MG/ML; 3 mL bupivacaine 0.5 % Aspiration Attempted: No   Patient tolerance:  Patient tolerated the procedure well with no immediate complications     Clinical Data: No additional findings.   Subjective: Chief Complaint  Patient presents with  . Left Knee - Cyst   Zeniya call the office yesterday and wanted to help we can evaluate her left knee. She's had some recurrent pain. She's noted to have osteoarthritis by prior films. She was also experiencing some pain in the popliteal fossa of the left knee was concerned that she may have a popliteal cyst. She's not had any numbness or  tingling. She denies any recent falls. She has done well with cortisone in both knees in the past HPI  Review of Systems   Objective: Vital Signs: Ht 5\' 5"  (1.651 m)   Wt 211 lb (95.7 kg)   BMI 35.11 kg/m   Physical Exam  Ortho Exam left knee exam with minimal fullness in the popliteal space. I attempted an aspiration after now cold Betadine prep and using of the clot but did not retrieve any fluid. There was a small effusion of her left knee with predominantly medial joint pain. She lacks just a few degrees to full extension and flexed over 105 without instability. No calf pain. Neurovascular exam intact distally :    Imaging: No results found.   PMFS History: Patient Active Problem List   Diagnosis Date Noted  . Dysphagia, unspecified(787.20) 06/07/2013  . Unspecified hypothyroidism 10/10/2012  . High cholesterol 10/10/2012  . Essential hypertension, benign 10/10/2012  . Rectal bleeding 10/10/2012  . Pill dysphagia 10/10/2012  . Family hx of colon cancer 10/10/2012  . Anemia 10/10/2012   Past Medical History:  Diagnosis Date  . Cancer [facility])    left breast cancer  . Carotid artery occlusion   . Diabetes mellitus without complication (HCC)    diet controlled  . Family hx of colon cancer   . GERD (gastroesophageal reflux disease)   .  High cholesterol   . History of gout   . Hypertension    for over 20 yrs  . Hypothyroid   . Neuropathy   . Pill dysphagia     Family History  Problem Relation Age of Onset  . Colon cancer Brother     Past Surgical History:  Procedure Laterality Date  . BACK SURGERY     5 rods and 5 screws and cage in back, has had 4 back surgeries.  Marland Kitchen BREAST LUMPECTOMY     left breast  . CATARACT EXTRACTION W/PHACO Left 12/31/2014   Procedure: CATARACT EXTRACTION PHACO AND INTRAOCULAR LENS PLACEMENT (IOC);  Surgeon: Tonny Branch, MD;  Location: [facility];  Service: Ophthalmology;  Laterality: Left;  CDE:8.45  . CATARACT EXTRACTION W/PHACO Right  01/28/2015   Procedure: CATARACT EXTRACTION PHACO AND INTRAOCULAR LENS PLACEMENT RIGHT EYE;  Surgeon: Tonny Branch, MD;  Location: [facility];  Service: Ophthalmology;  Laterality: Right;  CDE:4.92  . CHOLECYSTECTOMY    . COLONOSCOPY N/A 10/21/2012   Procedure: COLONOSCOPY;  Surgeon: Rogene Houston, MD;  [facility];  Service: Endoscopy;  Laterality: N/A;  . ESOPHAGOGASTRODUODENOSCOPY (EGD) WITH ESOPHAGEAL DILATION N/A 06/09/2013   Procedure: ESOPHAGOGASTRODUODENOSCOPY (EGD) WITH ESOPHAGEAL DILATION;  Surgeon: Rogene Houston, MD;  [facility];  Service: Endoscopy;  Laterality: N/A;  125  . KNEE SURGERY Right    arthroscopy x2  . SPINE SURGERY     3 surgeries by Dr. Jenne Campus,  Last surgery 2 years ago by Dr. Carloyn Manner in [place] History  . Not on file.   Social History Main Topics  . Smoking status: Never Smoker  . Smokeless tobacco: Never Used  . Alcohol use No  . Drug use: No  . Sexual activity: Yes    Birth control/ protection: Post-menopausal     Garald Balding, MD   Note - This record has been created using Editor, commissioning.  Chart creation errors have been sought, but may not always  have been located. Such creation errors do not reflect on  the standard of medical care.

## 2016-11-17 DIAGNOSIS — R809 Proteinuria, unspecified: Secondary | ICD-10-CM | POA: Diagnosis not present

## 2016-11-17 DIAGNOSIS — E1129 Type 2 diabetes mellitus with other diabetic kidney complication: Secondary | ICD-10-CM | POA: Diagnosis not present

## 2016-11-17 DIAGNOSIS — N183 Chronic kidney disease, stage 3 (moderate): Secondary | ICD-10-CM | POA: Diagnosis not present

## 2016-11-17 DIAGNOSIS — I10 Essential (primary) hypertension: Secondary | ICD-10-CM | POA: Diagnosis not present

## 2016-11-17 DIAGNOSIS — I509 Heart failure, unspecified: Secondary | ICD-10-CM | POA: Diagnosis not present

## 2016-11-17 DIAGNOSIS — D509 Iron deficiency anemia, unspecified: Secondary | ICD-10-CM | POA: Diagnosis not present

## 2016-11-18 DIAGNOSIS — M5126 Other intervertebral disc displacement, lumbar region: Secondary | ICD-10-CM | POA: Diagnosis not present

## 2016-11-20 DIAGNOSIS — M5126 Other intervertebral disc displacement, lumbar region: Secondary | ICD-10-CM | POA: Diagnosis not present

## 2016-11-25 DIAGNOSIS — M5126 Other intervertebral disc displacement, lumbar region: Secondary | ICD-10-CM | POA: Diagnosis not present

## 2016-11-27 DIAGNOSIS — M5126 Other intervertebral disc displacement, lumbar region: Secondary | ICD-10-CM | POA: Diagnosis not present

## 2016-11-30 DIAGNOSIS — I10 Essential (primary) hypertension: Secondary | ICD-10-CM | POA: Diagnosis not present

## 2016-11-30 DIAGNOSIS — E119 Type 2 diabetes mellitus without complications: Secondary | ICD-10-CM | POA: Diagnosis not present

## 2016-12-01 DIAGNOSIS — M5126 Other intervertebral disc displacement, lumbar region: Secondary | ICD-10-CM | POA: Diagnosis not present

## 2016-12-02 DIAGNOSIS — E039 Hypothyroidism, unspecified: Secondary | ICD-10-CM | POA: Diagnosis not present

## 2016-12-02 DIAGNOSIS — E1165 Type 2 diabetes mellitus with hyperglycemia: Secondary | ICD-10-CM | POA: Diagnosis not present

## 2016-12-02 DIAGNOSIS — D649 Anemia, unspecified: Secondary | ICD-10-CM | POA: Diagnosis not present

## 2016-12-02 DIAGNOSIS — T402X5A Adverse effect of other opioids, initial encounter: Secondary | ICD-10-CM | POA: Diagnosis not present

## 2016-12-02 DIAGNOSIS — R269 Unspecified abnormalities of gait and mobility: Secondary | ICD-10-CM | POA: Diagnosis not present

## 2016-12-02 DIAGNOSIS — K5903 Drug induced constipation: Secondary | ICD-10-CM | POA: Diagnosis not present

## 2016-12-02 DIAGNOSIS — I6529 Occlusion and stenosis of unspecified carotid artery: Secondary | ICD-10-CM | POA: Diagnosis not present

## 2016-12-02 DIAGNOSIS — N183 Chronic kidney disease, stage 3 (moderate): Secondary | ICD-10-CM | POA: Diagnosis not present

## 2016-12-02 DIAGNOSIS — Z299 Encounter for prophylactic measures, unspecified: Secondary | ICD-10-CM | POA: Diagnosis not present

## 2016-12-02 DIAGNOSIS — E1142 Type 2 diabetes mellitus with diabetic polyneuropathy: Secondary | ICD-10-CM | POA: Diagnosis not present

## 2016-12-02 DIAGNOSIS — Z6834 Body mass index (BMI) 34.0-34.9, adult: Secondary | ICD-10-CM | POA: Diagnosis not present

## 2016-12-02 DIAGNOSIS — E1122 Type 2 diabetes mellitus with diabetic chronic kidney disease: Secondary | ICD-10-CM | POA: Diagnosis not present

## 2016-12-04 DIAGNOSIS — M5126 Other intervertebral disc displacement, lumbar region: Secondary | ICD-10-CM | POA: Diagnosis not present

## 2016-12-08 DIAGNOSIS — M5126 Other intervertebral disc displacement, lumbar region: Secondary | ICD-10-CM | POA: Diagnosis not present

## 2016-12-11 DIAGNOSIS — M5126 Other intervertebral disc displacement, lumbar region: Secondary | ICD-10-CM | POA: Diagnosis not present

## 2016-12-15 DIAGNOSIS — M5126 Other intervertebral disc displacement, lumbar region: Secondary | ICD-10-CM | POA: Diagnosis not present

## 2016-12-18 DIAGNOSIS — M5126 Other intervertebral disc displacement, lumbar region: Secondary | ICD-10-CM | POA: Diagnosis not present

## 2016-12-22 DIAGNOSIS — M5126 Other intervertebral disc displacement, lumbar region: Secondary | ICD-10-CM | POA: Diagnosis not present

## 2016-12-22 DIAGNOSIS — M47816 Spondylosis without myelopathy or radiculopathy, lumbar region: Secondary | ICD-10-CM | POA: Diagnosis not present

## 2016-12-22 DIAGNOSIS — M5124 Other intervertebral disc displacement, thoracic region: Secondary | ICD-10-CM | POA: Diagnosis not present

## 2016-12-23 DIAGNOSIS — M5126 Other intervertebral disc displacement, lumbar region: Secondary | ICD-10-CM | POA: Diagnosis not present

## 2016-12-25 DIAGNOSIS — M5126 Other intervertebral disc displacement, lumbar region: Secondary | ICD-10-CM | POA: Diagnosis not present

## 2016-12-28 DIAGNOSIS — I10 Essential (primary) hypertension: Secondary | ICD-10-CM | POA: Diagnosis not present

## 2016-12-28 DIAGNOSIS — E119 Type 2 diabetes mellitus without complications: Secondary | ICD-10-CM | POA: Diagnosis not present

## 2016-12-29 DIAGNOSIS — M5126 Other intervertebral disc displacement, lumbar region: Secondary | ICD-10-CM | POA: Diagnosis not present

## 2017-01-06 ENCOUNTER — Ambulatory Visit (INDEPENDENT_AMBULATORY_CARE_PROVIDER_SITE_OTHER): Payer: Medicare Other | Admitting: Orthopaedic Surgery

## 2017-01-06 ENCOUNTER — Encounter (INDEPENDENT_AMBULATORY_CARE_PROVIDER_SITE_OTHER): Payer: Self-pay | Admitting: Orthopaedic Surgery

## 2017-01-06 VITALS — BP 111/66 | HR 74 | Ht 64.0 in | Wt 200.0 lb

## 2017-01-06 DIAGNOSIS — G8929 Other chronic pain: Secondary | ICD-10-CM

## 2017-01-06 DIAGNOSIS — M25561 Pain in right knee: Secondary | ICD-10-CM | POA: Diagnosis not present

## 2017-01-06 DIAGNOSIS — I6523 Occlusion and stenosis of bilateral carotid arteries: Secondary | ICD-10-CM | POA: Diagnosis not present

## 2017-01-06 DIAGNOSIS — M25562 Pain in left knee: Secondary | ICD-10-CM

## 2017-01-06 MED ORDER — BUPIVACAINE HCL 0.5 % IJ SOLN
3.0000 mL | INTRAMUSCULAR | Status: AC | PRN
  Administered 2017-01-06: 3 mL via INTRA_ARTICULAR

## 2017-01-06 MED ORDER — METHYLPREDNISOLONE ACETATE 40 MG/ML IJ SUSP
80.0000 mg | INTRAMUSCULAR | Status: AC | PRN
  Administered 2017-01-06: 80 mg

## 2017-01-06 MED ORDER — LIDOCAINE HCL 1 % IJ SOLN
5.0000 mL | INTRAMUSCULAR | Status: AC | PRN
  Administered 2017-01-06: 5 mL

## 2017-01-06 NOTE — Progress Notes (Signed)
Office Visit Note   Patient: Rhonda Mejia           Date of Birth: Nov 22, 1928           MRN: [id number] Visit Date: 01/06/2017              Requested by: Monico Blitz, MD [address] [place], [place] [address] PCP: Monico Blitz, MD   Assessment & Plan: Visit Diagnoses:  1. Chronic pain of both knees   Osteoarthritis both knees with probable pseudogout  Plan: Cortisone injection right knee today, return in 2 weeks to inject left knee  Follow-Up Instructions: Return in about 2 weeks (around 01/20/2017).   Orders:  No orders of the defined types were placed in this encounter.  No orders of the defined types were placed in this encounter.     Procedures: Large Joint Inj Date/Time: 01/06/2017 2:28 PM Performed by: Garald Balding Authorized by: Garald Balding   Consent Given by:  Patient Timeout: prior to procedure the correct patient, procedure, and site was verified   Indications:  Pain and joint swelling Location:  Knee Site:  R knee Prep: patient was prepped and draped in usual sterile fashion   Needle Size:  25 G Needle Length:  1.5 inches Approach:  Anteromedial Ultrasound Guidance: No   Fluoroscopic Guidance: No   Arthrogram: No   Medications:  5 mL lidocaine 1 %; 80 mg methylPREDNISolone acetate 40 MG/ML; 3 mL bupivacaine 0.5 % Aspiration Attempted: No   Patient tolerance:  Patient tolerated the procedure well with no immediate complications     Clinical Data: No additional findings.   Subjective: Chief Complaint  Patient presents with  . Right Knee - Pain  . Left Knee - Pain, Edema   Rhonda Mejia is been seen on a number of occasions in the past for evaluation of the osteoarthritis of both of her knees. She's had prior viscose supplementation and cortisone injections with temporary relief. He does have a number of comorbidities that my opinion might preclude her from having a knee replacement. She's had some issues with her back. She's also had some  problems with lower extremity swelling and is on Lasix HPI  Review of Systems   Objective: Vital Signs: BP 111/66   Pulse 74   Ht 5\' 4"  (1.626 m)   Wt 200 lb (90.7 kg)   BMI 34.33 kg/m   Physical Exam  Ortho Exam bilateral nonpitting edema of her ankles. Greater lateral than medial joint pain right knee which is the more symptomatic. No effusion. Neither knee was hot warm or swollen. Increased valgus with weightbearing. Some patellar crepitation. No popliteal mass. Straight leg raise negative. Painless range of motion both hips Imaging: No results found.   PMFS History: Patient Active Problem List   Diagnosis Date Noted  . Dysphagia, unspecified(787.20) 06/07/2013  . Unspecified hypothyroidism 10/10/2012  . High cholesterol 10/10/2012  . Essential hypertension, benign 10/10/2012  . Rectal bleeding 10/10/2012  . Pill dysphagia 10/10/2012  . Family hx of colon cancer 10/10/2012  . Anemia 10/10/2012   Past Medical History:  Diagnosis Date  . Cancer [facility])    left breast cancer  . Carotid artery occlusion   . Diabetes mellitus without complication (HCC)    diet controlled  . Family hx of colon cancer   . GERD (gastroesophageal reflux disease)   . High cholesterol   . History of gout   . Hypertension    for over 20 yrs  .  Hypothyroid   . Neuropathy   . Pill dysphagia     Family History  Problem Relation Age of Onset  . Colon cancer Brother     Past Surgical History:  Procedure Laterality Date  . BACK SURGERY     5 rods and 5 screws and cage in back, has had 4 back surgeries.  Marland Kitchen BREAST LUMPECTOMY     left breast  . CATARACT EXTRACTION W/PHACO Left 12/31/2014   Procedure: CATARACT EXTRACTION PHACO AND INTRAOCULAR LENS PLACEMENT (IOC);  Surgeon: Tonny Branch, MD;  Location: [facility];  Service: Ophthalmology;  Laterality: Left;  CDE:8.45  . CATARACT EXTRACTION W/PHACO Right 01/28/2015   Procedure: CATARACT EXTRACTION PHACO AND INTRAOCULAR LENS PLACEMENT RIGHT EYE;   Surgeon: Tonny Branch, MD;  Location: [facility];  Service: Ophthalmology;  Laterality: Right;  CDE:4.92  . CHOLECYSTECTOMY    . COLONOSCOPY N/A 10/21/2012   Procedure: COLONOSCOPY;  Surgeon: Rogene Houston, MD;  [facility];  Service: Endoscopy;  Laterality: N/A;  . ESOPHAGOGASTRODUODENOSCOPY (EGD) WITH ESOPHAGEAL DILATION N/A 06/09/2013   Procedure: ESOPHAGOGASTRODUODENOSCOPY (EGD) WITH ESOPHAGEAL DILATION;  Surgeon: Rogene Houston, MD;  [facility];  Service: Endoscopy;  Laterality: N/A;  125  . KNEE SURGERY Right    arthroscopy x2  . SPINE SURGERY     3 surgeries by Dr. Jenne Campus,  Last surgery 2 years ago by Dr. Carloyn Manner in [place] History  . Not on file.   Social History Main Topics  . Smoking status: Never Smoker  . Smokeless tobacco: Never Used  . Alcohol use No  . Drug use: No  . Sexual activity: Yes    Birth control/ protection: Post-menopausal     Garald Balding, MD   Note - This record has been created using Editor, commissioning.  Chart creation errors have been sought, but may not always  have been located. Such creation errors do not reflect on  the standard of medical care.

## 2017-01-13 DIAGNOSIS — M5126 Other intervertebral disc displacement, lumbar region: Secondary | ICD-10-CM | POA: Diagnosis not present

## 2017-01-15 ENCOUNTER — Other Ambulatory Visit: Payer: Self-pay | Admitting: Cardiology

## 2017-01-15 DIAGNOSIS — M5126 Other intervertebral disc displacement, lumbar region: Secondary | ICD-10-CM | POA: Diagnosis not present

## 2017-01-22 DIAGNOSIS — R269 Unspecified abnormalities of gait and mobility: Secondary | ICD-10-CM | POA: Diagnosis not present

## 2017-01-22 DIAGNOSIS — Z713 Dietary counseling and surveillance: Secondary | ICD-10-CM | POA: Diagnosis not present

## 2017-01-22 DIAGNOSIS — Z6834 Body mass index (BMI) 34.0-34.9, adult: Secondary | ICD-10-CM | POA: Diagnosis not present

## 2017-01-22 DIAGNOSIS — I10 Essential (primary) hypertension: Secondary | ICD-10-CM | POA: Diagnosis not present

## 2017-01-22 DIAGNOSIS — E039 Hypothyroidism, unspecified: Secondary | ICD-10-CM | POA: Diagnosis not present

## 2017-01-22 DIAGNOSIS — Z299 Encounter for prophylactic measures, unspecified: Secondary | ICD-10-CM | POA: Diagnosis not present

## 2017-01-22 DIAGNOSIS — N183 Chronic kidney disease, stage 3 (moderate): Secondary | ICD-10-CM | POA: Diagnosis not present

## 2017-01-22 DIAGNOSIS — R600 Localized edema: Secondary | ICD-10-CM | POA: Diagnosis not present

## 2017-01-26 DIAGNOSIS — I10 Essential (primary) hypertension: Secondary | ICD-10-CM | POA: Diagnosis not present

## 2017-01-26 DIAGNOSIS — E119 Type 2 diabetes mellitus without complications: Secondary | ICD-10-CM | POA: Diagnosis not present

## 2017-01-27 ENCOUNTER — Ambulatory Visit (INDEPENDENT_AMBULATORY_CARE_PROVIDER_SITE_OTHER): Payer: Medicare Other | Admitting: Orthopaedic Surgery

## 2017-01-27 DIAGNOSIS — N183 Chronic kidney disease, stage 3 (moderate): Secondary | ICD-10-CM | POA: Diagnosis not present

## 2017-01-27 DIAGNOSIS — M25562 Pain in left knee: Secondary | ICD-10-CM | POA: Diagnosis not present

## 2017-01-27 DIAGNOSIS — G8929 Other chronic pain: Secondary | ICD-10-CM | POA: Diagnosis not present

## 2017-01-27 DIAGNOSIS — Z79899 Other long term (current) drug therapy: Secondary | ICD-10-CM | POA: Diagnosis not present

## 2017-01-27 DIAGNOSIS — M25561 Pain in right knee: Principal | ICD-10-CM

## 2017-01-27 DIAGNOSIS — R809 Proteinuria, unspecified: Secondary | ICD-10-CM | POA: Diagnosis not present

## 2017-01-27 DIAGNOSIS — I129 Hypertensive chronic kidney disease with stage 1 through stage 4 chronic kidney disease, or unspecified chronic kidney disease: Secondary | ICD-10-CM | POA: Diagnosis not present

## 2017-01-27 DIAGNOSIS — D509 Iron deficiency anemia, unspecified: Secondary | ICD-10-CM | POA: Diagnosis not present

## 2017-01-27 MED ORDER — BUPIVACAINE HCL 0.5 % IJ SOLN
3.0000 mL | INTRAMUSCULAR | Status: AC | PRN
  Administered 2017-01-27: 3 mL via INTRA_ARTICULAR

## 2017-01-27 MED ORDER — METHYLPREDNISOLONE ACETATE 40 MG/ML IJ SUSP
80.0000 mg | INTRAMUSCULAR | Status: AC | PRN
  Administered 2017-01-27: 80 mg

## 2017-01-27 MED ORDER — LIDOCAINE HCL 1 % IJ SOLN
5.0000 mL | INTRAMUSCULAR | Status: AC | PRN
  Administered 2017-01-27: 5 mL

## 2017-01-27 NOTE — Progress Notes (Signed)
Office Visit Note   Patient: Rhonda Mejia           Date of Birth: 13-Apr-1929           MRN: [id number] Visit Date: 01/27/2017              Requested by: Monico Blitz, MD [address] [place], [place] [address] PCP: Monico Blitz, MD   Assessment & Plan: Visit Diagnoses:  1. Chronic pain of both knees   Osteoarthritis left knee  Plan: Cortisone injection left knee. Follow-up as needed. Long discussion regarding a problem with her leg in her ankle and her back. She is being followed by Dr. Carloyn Manner who has suggested another back surgery. She's been on chronic pain medicine since her last surgery about 3 years ago.  Orders:  No orders of the defined types were placed in this encounter.  No orders of the defined types were placed in this encounter.     Procedures: Large Joint Inj Date/Time: 01/27/2017 2:32 PM Performed by: Garald Balding Authorized by: Garald Balding   Consent Given by:  Patient Timeout: prior to procedure the correct patient, procedure, and site was verified   Indications:  Pain and joint swelling Location:  Knee Site:  L knee Prep: patient was prepped and draped in usual sterile fashion   Needle Size:  25 G Needle Length:  1.5 inches Approach:  Anteromedial Ultrasound Guidance: No   Fluoroscopic Guidance: No   Arthrogram: No   Medications:  5 mL lidocaine 1 %; 80 mg methylPREDNISolone acetate 40 MG/ML; 3 mL bupivacaine 0.5 % Aspiration Attempted: No   Patient tolerance:  Patient tolerated the procedure well with no immediate complications     Clinical Data: No additional findings.   Subjective: No chief complaint on file. Rhonda Mejia was seen several weeks ago for evaluation of bilateral knee osteoarthritis. I injected her right knee and she notes it made a difference. In the interim she's had several episodes of pain in her left leg that is somewhat nondescript and difficult for her to describe. She actually had come back today for to consider  cortisone injection of her left knee. She really wanted talk about her back and her leg pain today but of course I don't know anything about her back problems i.e. Records, prior surgery,etc. She does have a problem with balance and has been through therapy which she seems to make a difference. She is having some discomfort in her knee but I don't think it's responsible for the entire left leg pain. I suspect is related to her back. HPI  Review of Systems   Objective: Vital Signs: There were no vitals taken for this visit.  Physical Exam  Ortho Exam left knee with full extension. Possible small effusion. Mostly medial joint pain. No instability. No calf pain. Mild edema of her left ankle and foot  Specialty Comments:  No specialty comments available.  Imaging: No results found.   PMFS History: Patient Active Problem List   Diagnosis Date Noted  . Dysphagia, unspecified(787.20) 06/07/2013  . Unspecified hypothyroidism 10/10/2012  . High cholesterol 10/10/2012  . Essential hypertension, benign 10/10/2012  . Rectal bleeding 10/10/2012  . Pill dysphagia 10/10/2012  . Family hx of colon cancer 10/10/2012  . Anemia 10/10/2012   Past Medical History:  Diagnosis Date  . Cancer [facility])    left breast cancer  . Carotid artery occlusion   . Diabetes mellitus without complication (HCC)    diet controlled  .  Family hx of colon cancer   . GERD (gastroesophageal reflux disease)   . High cholesterol   . History of gout   . Hypertension    for over 20 yrs  . Hypothyroid   . Neuropathy   . Pill dysphagia     Family History  Problem Relation Age of Onset  . Colon cancer Brother     Past Surgical History:  Procedure Laterality Date  . BACK SURGERY     5 rods and 5 screws and cage in back, has had 4 back surgeries.  Marland Kitchen BREAST LUMPECTOMY     left breast  . CATARACT EXTRACTION W/PHACO Left 12/31/2014   Procedure: CATARACT EXTRACTION PHACO AND INTRAOCULAR LENS PLACEMENT (IOC);   Surgeon: Tonny Branch, MD;  Location: [facility];  Service: Ophthalmology;  Laterality: Left;  CDE:8.45  . CATARACT EXTRACTION W/PHACO Right 01/28/2015   Procedure: CATARACT EXTRACTION PHACO AND INTRAOCULAR LENS PLACEMENT RIGHT EYE;  Surgeon: Tonny Branch, MD;  Location: [facility];  Service: Ophthalmology;  Laterality: Right;  CDE:4.92  . CHOLECYSTECTOMY    . COLONOSCOPY N/A 10/21/2012   Procedure: COLONOSCOPY;  Surgeon: Rogene Houston, MD;  [facility];  Service: Endoscopy;  Laterality: N/A;  . ESOPHAGOGASTRODUODENOSCOPY (EGD) WITH ESOPHAGEAL DILATION N/A 06/09/2013   Procedure: ESOPHAGOGASTRODUODENOSCOPY (EGD) WITH ESOPHAGEAL DILATION;  Surgeon: Rogene Houston, MD;  [facility];  Service: Endoscopy;  Laterality: N/A;  125  . KNEE SURGERY Right    arthroscopy x2  . SPINE SURGERY     3 surgeries by Dr. Jenne Campus,  Last surgery 2 years ago by Dr. Carloyn Manner in [place] History  . Not on file.   Social History Main Topics  . Smoking status: Never Smoker  . Smokeless tobacco: Never Used  . Alcohol use No  . Drug use: No  . Sexual activity: Yes    Birth control/ protection: Post-menopausal     Garald Balding, MD   Note - This record has been created using Editor, commissioning.  Chart creation errors have been sought, but may not always  have been located. Such creation errors do not reflect on  the standard of medical care.

## 2017-01-29 DIAGNOSIS — N183 Chronic kidney disease, stage 3 (moderate): Secondary | ICD-10-CM | POA: Diagnosis not present

## 2017-01-29 DIAGNOSIS — I129 Hypertensive chronic kidney disease with stage 1 through stage 4 chronic kidney disease, or unspecified chronic kidney disease: Secondary | ICD-10-CM | POA: Diagnosis not present

## 2017-01-29 DIAGNOSIS — D509 Iron deficiency anemia, unspecified: Secondary | ICD-10-CM | POA: Diagnosis not present

## 2017-01-29 DIAGNOSIS — Z79899 Other long term (current) drug therapy: Secondary | ICD-10-CM | POA: Diagnosis not present

## 2017-01-29 DIAGNOSIS — R809 Proteinuria, unspecified: Secondary | ICD-10-CM | POA: Diagnosis not present

## 2017-02-02 DIAGNOSIS — W1839XA Other fall on same level, initial encounter: Secondary | ICD-10-CM | POA: Diagnosis not present

## 2017-02-02 DIAGNOSIS — M25571 Pain in right ankle and joints of right foot: Secondary | ICD-10-CM | POA: Diagnosis not present

## 2017-02-02 DIAGNOSIS — E039 Hypothyroidism, unspecified: Secondary | ICD-10-CM | POA: Diagnosis not present

## 2017-02-02 DIAGNOSIS — S82431A Displaced oblique fracture of shaft of right fibula, initial encounter for closed fracture: Secondary | ICD-10-CM | POA: Diagnosis not present

## 2017-02-02 DIAGNOSIS — Z8639 Personal history of other endocrine, nutritional and metabolic disease: Secondary | ICD-10-CM | POA: Diagnosis not present

## 2017-02-02 DIAGNOSIS — S8254XA Nondisplaced fracture of medial malleolus of right tibia, initial encounter for closed fracture: Secondary | ICD-10-CM | POA: Diagnosis not present

## 2017-02-02 DIAGNOSIS — E119 Type 2 diabetes mellitus without complications: Secondary | ICD-10-CM | POA: Diagnosis not present

## 2017-02-02 DIAGNOSIS — M79641 Pain in right hand: Secondary | ICD-10-CM | POA: Diagnosis not present

## 2017-02-02 DIAGNOSIS — Z79899 Other long term (current) drug therapy: Secondary | ICD-10-CM | POA: Diagnosis not present

## 2017-02-02 DIAGNOSIS — K219 Gastro-esophageal reflux disease without esophagitis: Secondary | ICD-10-CM | POA: Diagnosis not present

## 2017-02-02 DIAGNOSIS — Z7982 Long term (current) use of aspirin: Secondary | ICD-10-CM | POA: Diagnosis not present

## 2017-02-02 DIAGNOSIS — Z853 Personal history of malignant neoplasm of breast: Secondary | ICD-10-CM | POA: Diagnosis not present

## 2017-02-02 DIAGNOSIS — M1990 Unspecified osteoarthritis, unspecified site: Secondary | ICD-10-CM | POA: Diagnosis not present

## 2017-02-02 DIAGNOSIS — S8251XA Displaced fracture of medial malleolus of right tibia, initial encounter for closed fracture: Secondary | ICD-10-CM | POA: Diagnosis not present

## 2017-02-02 DIAGNOSIS — I10 Essential (primary) hypertension: Secondary | ICD-10-CM | POA: Diagnosis not present

## 2017-02-02 DIAGNOSIS — S82831A Other fracture of upper and lower end of right fibula, initial encounter for closed fracture: Secondary | ICD-10-CM | POA: Diagnosis not present

## 2017-02-04 DIAGNOSIS — E119 Type 2 diabetes mellitus without complications: Secondary | ICD-10-CM | POA: Diagnosis not present

## 2017-02-04 DIAGNOSIS — S8251XS Displaced fracture of medial malleolus of right tibia, sequela: Secondary | ICD-10-CM | POA: Diagnosis not present

## 2017-02-04 DIAGNOSIS — M109 Gout, unspecified: Secondary | ICD-10-CM | POA: Diagnosis not present

## 2017-02-04 DIAGNOSIS — Z9181 History of falling: Secondary | ICD-10-CM | POA: Diagnosis not present

## 2017-02-04 DIAGNOSIS — M25571 Pain in right ankle and joints of right foot: Secondary | ICD-10-CM | POA: Diagnosis not present

## 2017-02-04 DIAGNOSIS — E039 Hypothyroidism, unspecified: Secondary | ICD-10-CM | POA: Diagnosis not present

## 2017-02-04 DIAGNOSIS — R030 Elevated blood-pressure reading, without diagnosis of hypertension: Secondary | ICD-10-CM | POA: Diagnosis not present

## 2017-02-04 DIAGNOSIS — S82841A Displaced bimalleolar fracture of right lower leg, initial encounter for closed fracture: Secondary | ICD-10-CM | POA: Diagnosis not present

## 2017-02-04 DIAGNOSIS — I10 Essential (primary) hypertension: Secondary | ICD-10-CM | POA: Diagnosis not present

## 2017-02-05 DIAGNOSIS — I10 Essential (primary) hypertension: Secondary | ICD-10-CM | POA: Diagnosis not present

## 2017-02-05 DIAGNOSIS — Y92481 Parking lot as the place of occurrence of the external cause: Secondary | ICD-10-CM | POA: Diagnosis not present

## 2017-02-05 DIAGNOSIS — S82841A Displaced bimalleolar fracture of right lower leg, initial encounter for closed fracture: Secondary | ICD-10-CM | POA: Diagnosis not present

## 2017-02-05 DIAGNOSIS — W1839XA Other fall on same level, initial encounter: Secondary | ICD-10-CM | POA: Diagnosis not present

## 2017-02-06 DIAGNOSIS — W1839XA Other fall on same level, initial encounter: Secondary | ICD-10-CM | POA: Diagnosis not present

## 2017-02-06 DIAGNOSIS — I10 Essential (primary) hypertension: Secondary | ICD-10-CM | POA: Diagnosis not present

## 2017-02-06 DIAGNOSIS — S8291XA Unspecified fracture of right lower leg, initial encounter for closed fracture: Secondary | ICD-10-CM | POA: Diagnosis not present

## 2017-02-06 DIAGNOSIS — S82841A Displaced bimalleolar fracture of right lower leg, initial encounter for closed fracture: Secondary | ICD-10-CM | POA: Diagnosis not present

## 2017-02-06 DIAGNOSIS — Y92481 Parking lot as the place of occurrence of the external cause: Secondary | ICD-10-CM | POA: Diagnosis not present

## 2017-02-07 DIAGNOSIS — Y92481 Parking lot as the place of occurrence of the external cause: Secondary | ICD-10-CM | POA: Diagnosis not present

## 2017-02-07 DIAGNOSIS — I10 Essential (primary) hypertension: Secondary | ICD-10-CM | POA: Diagnosis not present

## 2017-02-07 DIAGNOSIS — W1839XA Other fall on same level, initial encounter: Secondary | ICD-10-CM | POA: Diagnosis not present

## 2017-02-07 DIAGNOSIS — S82841A Displaced bimalleolar fracture of right lower leg, initial encounter for closed fracture: Secondary | ICD-10-CM | POA: Diagnosis not present

## 2017-02-08 DIAGNOSIS — E559 Vitamin D deficiency, unspecified: Secondary | ICD-10-CM | POA: Diagnosis present

## 2017-02-08 DIAGNOSIS — M6281 Muscle weakness (generalized): Secondary | ICD-10-CM | POA: Diagnosis not present

## 2017-02-08 DIAGNOSIS — Z4789 Encounter for other orthopedic aftercare: Secondary | ICD-10-CM | POA: Diagnosis not present

## 2017-02-08 DIAGNOSIS — E119 Type 2 diabetes mellitus without complications: Secondary | ICD-10-CM | POA: Diagnosis present

## 2017-02-08 DIAGNOSIS — Z809 Family history of malignant neoplasm, unspecified: Secondary | ICD-10-CM | POA: Diagnosis not present

## 2017-02-08 DIAGNOSIS — Z6831 Body mass index (BMI) 31.0-31.9, adult: Secondary | ICD-10-CM | POA: Diagnosis not present

## 2017-02-08 DIAGNOSIS — Z888 Allergy status to other drugs, medicaments and biological substances status: Secondary | ICD-10-CM | POA: Diagnosis not present

## 2017-02-08 DIAGNOSIS — W1839XA Other fall on same level, initial encounter: Secondary | ICD-10-CM | POA: Diagnosis not present

## 2017-02-08 DIAGNOSIS — S82841A Displaced bimalleolar fracture of right lower leg, initial encounter for closed fracture: Secondary | ICD-10-CM | POA: Diagnosis present

## 2017-02-08 DIAGNOSIS — Y92481 Parking lot as the place of occurrence of the external cause: Secondary | ICD-10-CM | POA: Diagnosis not present

## 2017-02-08 DIAGNOSIS — Z880 Allergy status to penicillin: Secondary | ICD-10-CM | POA: Diagnosis not present

## 2017-02-08 DIAGNOSIS — E860 Dehydration: Secondary | ICD-10-CM | POA: Diagnosis not present

## 2017-02-08 DIAGNOSIS — K219 Gastro-esophageal reflux disease without esophagitis: Secondary | ICD-10-CM | POA: Diagnosis present

## 2017-02-08 DIAGNOSIS — E669 Obesity, unspecified: Secondary | ICD-10-CM | POA: Diagnosis present

## 2017-02-08 DIAGNOSIS — M109 Gout, unspecified: Secondary | ICD-10-CM | POA: Diagnosis present

## 2017-02-08 DIAGNOSIS — S82891D Other fracture of right lower leg, subsequent encounter for closed fracture with routine healing: Secondary | ICD-10-CM | POA: Diagnosis not present

## 2017-02-08 DIAGNOSIS — Z886 Allergy status to analgesic agent status: Secondary | ICD-10-CM | POA: Diagnosis not present

## 2017-02-08 DIAGNOSIS — E039 Hypothyroidism, unspecified: Secondary | ICD-10-CM | POA: Diagnosis present

## 2017-02-08 DIAGNOSIS — R2689 Other abnormalities of gait and mobility: Secondary | ICD-10-CM | POA: Diagnosis not present

## 2017-02-08 DIAGNOSIS — M1990 Unspecified osteoarthritis, unspecified site: Secondary | ICD-10-CM | POA: Diagnosis present

## 2017-02-08 DIAGNOSIS — F329 Major depressive disorder, single episode, unspecified: Secondary | ICD-10-CM | POA: Diagnosis not present

## 2017-02-08 DIAGNOSIS — I10 Essential (primary) hypertension: Secondary | ICD-10-CM | POA: Diagnosis present

## 2017-02-08 DIAGNOSIS — Z9181 History of falling: Secondary | ICD-10-CM | POA: Diagnosis not present

## 2017-02-08 DIAGNOSIS — K5900 Constipation, unspecified: Secondary | ICD-10-CM | POA: Diagnosis present

## 2017-02-08 DIAGNOSIS — Z881 Allergy status to other antibiotic agents status: Secondary | ICD-10-CM | POA: Diagnosis not present

## 2017-02-08 DIAGNOSIS — Z8249 Family history of ischemic heart disease and other diseases of the circulatory system: Secondary | ICD-10-CM | POA: Diagnosis not present

## 2017-02-11 DIAGNOSIS — E1122 Type 2 diabetes mellitus with diabetic chronic kidney disease: Secondary | ICD-10-CM | POA: Diagnosis not present

## 2017-02-11 DIAGNOSIS — M1990 Unspecified osteoarthritis, unspecified site: Secondary | ICD-10-CM | POA: Diagnosis not present

## 2017-02-11 DIAGNOSIS — S82891D Other fracture of right lower leg, subsequent encounter for closed fracture with routine healing: Secondary | ICD-10-CM | POA: Diagnosis not present

## 2017-02-11 DIAGNOSIS — M1711 Unilateral primary osteoarthritis, right knee: Secondary | ICD-10-CM | POA: Diagnosis not present

## 2017-02-11 DIAGNOSIS — Z9889 Other specified postprocedural states: Secondary | ICD-10-CM | POA: Diagnosis not present

## 2017-02-11 DIAGNOSIS — E1165 Type 2 diabetes mellitus with hyperglycemia: Secondary | ICD-10-CM | POA: Diagnosis not present

## 2017-02-11 DIAGNOSIS — Z299 Encounter for prophylactic measures, unspecified: Secondary | ICD-10-CM | POA: Diagnosis not present

## 2017-02-11 DIAGNOSIS — Z23 Encounter for immunization: Secondary | ICD-10-CM | POA: Diagnosis not present

## 2017-02-11 DIAGNOSIS — W1839XA Other fall on same level, initial encounter: Secondary | ICD-10-CM | POA: Diagnosis not present

## 2017-02-11 DIAGNOSIS — Z6834 Body mass index (BMI) 34.0-34.9, adult: Secondary | ICD-10-CM | POA: Diagnosis not present

## 2017-02-11 DIAGNOSIS — M1712 Unilateral primary osteoarthritis, left knee: Secondary | ICD-10-CM | POA: Diagnosis not present

## 2017-02-11 DIAGNOSIS — Y92481 Parking lot as the place of occurrence of the external cause: Secondary | ICD-10-CM | POA: Diagnosis not present

## 2017-02-11 DIAGNOSIS — M6281 Muscle weakness (generalized): Secondary | ICD-10-CM | POA: Diagnosis not present

## 2017-02-11 DIAGNOSIS — E039 Hypothyroidism, unspecified: Secondary | ICD-10-CM | POA: Diagnosis not present

## 2017-02-11 DIAGNOSIS — M25572 Pain in left ankle and joints of left foot: Secondary | ICD-10-CM | POA: Diagnosis not present

## 2017-02-11 DIAGNOSIS — I6523 Occlusion and stenosis of bilateral carotid arteries: Secondary | ICD-10-CM | POA: Diagnosis not present

## 2017-02-11 DIAGNOSIS — S8291XA Unspecified fracture of right lower leg, initial encounter for closed fracture: Secondary | ICD-10-CM | POA: Diagnosis not present

## 2017-02-11 DIAGNOSIS — E1142 Type 2 diabetes mellitus with diabetic polyneuropathy: Secondary | ICD-10-CM | POA: Diagnosis not present

## 2017-02-11 DIAGNOSIS — S82841A Displaced bimalleolar fracture of right lower leg, initial encounter for closed fracture: Secondary | ICD-10-CM | POA: Diagnosis not present

## 2017-02-11 DIAGNOSIS — N183 Chronic kidney disease, stage 3 (moderate): Secondary | ICD-10-CM | POA: Diagnosis not present

## 2017-02-11 DIAGNOSIS — E119 Type 2 diabetes mellitus without complications: Secondary | ICD-10-CM | POA: Diagnosis not present

## 2017-02-11 DIAGNOSIS — Z4789 Encounter for other orthopedic aftercare: Secondary | ICD-10-CM | POA: Diagnosis not present

## 2017-02-11 DIAGNOSIS — R2689 Other abnormalities of gait and mobility: Secondary | ICD-10-CM | POA: Diagnosis not present

## 2017-02-11 DIAGNOSIS — Z9181 History of falling: Secondary | ICD-10-CM | POA: Diagnosis not present

## 2017-02-11 DIAGNOSIS — F329 Major depressive disorder, single episode, unspecified: Secondary | ICD-10-CM | POA: Diagnosis not present

## 2017-02-11 DIAGNOSIS — M109 Gout, unspecified: Secondary | ICD-10-CM | POA: Diagnosis not present

## 2017-02-11 DIAGNOSIS — S82841D Displaced bimalleolar fracture of right lower leg, subsequent encounter for closed fracture with routine healing: Secondary | ICD-10-CM | POA: Diagnosis not present

## 2017-02-11 DIAGNOSIS — I10 Essential (primary) hypertension: Secondary | ICD-10-CM | POA: Diagnosis not present

## 2017-02-11 DIAGNOSIS — K219 Gastro-esophageal reflux disease without esophagitis: Secondary | ICD-10-CM | POA: Diagnosis not present

## 2017-02-12 DIAGNOSIS — Z9889 Other specified postprocedural states: Secondary | ICD-10-CM | POA: Diagnosis not present

## 2017-02-12 DIAGNOSIS — E119 Type 2 diabetes mellitus without complications: Secondary | ICD-10-CM | POA: Diagnosis not present

## 2017-03-10 DIAGNOSIS — S82841D Displaced bimalleolar fracture of right lower leg, subsequent encounter for closed fracture with routine healing: Secondary | ICD-10-CM | POA: Diagnosis not present

## 2017-03-26 DIAGNOSIS — S82841D Displaced bimalleolar fracture of right lower leg, subsequent encounter for closed fracture with routine healing: Secondary | ICD-10-CM | POA: Diagnosis not present

## 2017-03-31 ENCOUNTER — Ambulatory Visit: Payer: Medicare Other | Admitting: Cardiology

## 2017-03-31 NOTE — Progress Notes (Deleted)
Clinical Summary Ms. Klinke is a 83 y.o.female seen today for follow up of the following medical problems.  1. HTN - compliant with meds. She reports normal bp at her recent pcp visit    2. LE edema - echo 02/2015 difficult study ,with LVEF 50-55%, normal RV function. Diastolic function not described.  - norvasc stopped at last visit due to LE edema. Lasix increased to 40mg  daily. Edema has improved  - repeat echo 09/2015 LVEF 60-65%, abnormal diastolic function grade indeterminate   - followed by vascular for chronic venous insufficiency - improved with leg compressoins.   3. Carotid stenosis - carotid US 09/2015 with concern for distal left CCA stenosis, 1-39% right ICA stenosis.  - 11/2015 CTA neck with 30% LICA stenosis, focal contained dissecion left ICA, no significant RICA disease - followed by vascular. From there consultation CTA results thought to be misleading due to heavy calficications and fairly mild velocities by carotid US.   4. Back pain - followed by Dr Carloyn Manner - considering repeat back surgery  5. DM2 -she is  diet controlled, followed by pcp  6. Dysphagia - feeling of food and pills getting stuck   Past Medical History:  Diagnosis Date  . Cancer [facility])    left breast cancer  . Carotid artery occlusion   . Diabetes mellitus without complication (HCC)    diet controlled  . Family hx of colon cancer   . GERD (gastroesophageal reflux disease)   . High cholesterol   . History of gout   . Hypertension    for over 20 yrs  . Hypothyroid   . Neuropathy   . Pill dysphagia      Allergies  Allergen Reactions  . Lipitor [Atorvastatin] Other (See Comments)    Didn't agree with stomach  . Penicillins     Rash   . Sulfa Antibiotics     Unknown reaction      Current Outpatient Prescriptions  Medication Sig Dispense Refill  . allopurinol (ZYLOPRIM) 300 MG tablet Take 300 mg by mouth daily.    . AMITIZA 24 MCG capsule Take 24 mcg by  mouth daily.  1  . aspirin EC 81 MG tablet Take 81 mg by mouth daily.    Marland Kitchen atorvastatin (LIPITOR) 40 MG tablet Take 1 tablet (40 mg total) by mouth daily. (Patient not taking: Reported on 06/10/2016) 90 tablet 3  . Cyanocobalamin (B-12) 2500 MCG TABS Take 2 tablets by mouth daily.     Marland Kitchen docusate sodium (COLACE) 100 MG capsule Take 100 mg by mouth daily as needed for mild constipation.    . folic acid (FOLVITE) 1 MG tablet Take 1 mg by mouth daily.    . furosemide (LASIX) 40 MG tablet Take 20 mg by mouth daily.    . furosemide (LASIX) 40 MG tablet TAKE ONE TABLET BY MOUTH DAILY. 90 tablet 1  . gabapentin (NEURONTIN) 300 MG capsule Take 300 mg by mouth 2 (two) times daily.  2  . hydrALAZINE (APRESOLINE) 25 MG tablet Take 25 mg by mouth 2 (two) times daily.     Marland Kitchen levothyroxine (SYNTHROID, LEVOTHROID) 100 MCG tablet Take 100 mcg by mouth daily.    Marland Kitchen lisinopril (PRINIVIL,ZESTRIL) 40 MG tablet Take 40 mg by mouth daily.    . metoprolol succinate (TOPROL-XL) 50 MG 24 hr tablet Take 50 mg by mouth daily.  6  . metoprolol succinate (TOPROL-XL) 50 MG 24 hr tablet Take 50 mg by mouth daily. Take with  or immediately following a meal.    . Omega-3 Fatty Acids (FISH OIL PO) Take 1 capsule by mouth daily.    Marland Kitchen omeprazole (PRILOSEC) 40 MG capsule Take 40 mg by mouth daily.    Marland Kitchen pyridOXINE (VITAMIN B-6) 100 MG tablet Take 100 mg by mouth daily.    Marland Kitchen senna (SENOKOT) 8.6 MG tablet Take 1 tablet by mouth as needed for constipation.    . traMADol (ULTRAM) 50 MG tablet Take 50 mg by mouth every 6 (six) hours as needed for pain.    Marland Kitchen venlafaxine (EFFEXOR) 75 MG tablet Take 75 mg by mouth daily.     Marland Kitchen venlafaxine XR (EFFEXOR-XR) 75 MG 24 hr capsule Take 75 mg by mouth daily.  3  . VOLTAREN 1 % GEL APPLY 2-4 GRAMS TO THE AFFECTED AREA TWICE DAILY  1   No current facility-administered medications for this visit.      Past Surgical History:  Procedure Laterality Date  . BACK SURGERY     5 rods and 5 screws and  cage in back, has had 4 back surgeries.  Marland Kitchen BREAST LUMPECTOMY     left breast  . CATARACT EXTRACTION W/PHACO Left 12/31/2014   Procedure: CATARACT EXTRACTION PHACO AND INTRAOCULAR LENS PLACEMENT (IOC);  Surgeon: Tonny Ernest Orr, MD;  Location: [facility];  Service: Ophthalmology;  Laterality: Left;  CDE:8.45  . CATARACT EXTRACTION W/PHACO Right 01/28/2015   Procedure: CATARACT EXTRACTION PHACO AND INTRAOCULAR LENS PLACEMENT RIGHT EYE;  Surgeon: Tonny Honesty Menta, MD;  Location: [facility];  Service: Ophthalmology;  Laterality: Right;  CDE:4.92  . CHOLECYSTECTOMY    . COLONOSCOPY N/A 10/21/2012   Procedure: COLONOSCOPY;  Surgeon: Rogene Houston, MD;  Location: [facility];  Service: Endoscopy;  Laterality: N/A;  . ESOPHAGOGASTRODUODENOSCOPY (EGD) WITH ESOPHAGEAL DILATION N/A 06/09/2013   Procedure: ESOPHAGOGASTRODUODENOSCOPY (EGD) WITH ESOPHAGEAL DILATION;  Surgeon: Rogene Houston, MD;  [facility];  Service: Endoscopy;  Laterality: N/A;  125  . KNEE SURGERY Right    arthroscopy x2  . SPINE SURGERY     3 surgeries by Dr. Jenne Campus,  Last surgery 2 years ago by Dr. Carloyn Manner in [place] Reactions  . Lipitor [Atorvastatin] Other (See Comments)    Didn't agree with stomach  . Penicillins     Rash   . Sulfa Antibiotics     Unknown reaction       Family History  Problem Relation Age of Onset  . Colon cancer Brother      Social History Ms. Gwilliam reports that she has never smoked. She has never used smokeless tobacco. Ms. Romack reports that she does not drink alcohol.   Review of Systems CONSTITUTIONAL: No weight loss, fever, chills, weakness or fatigue.  HEENT: Eyes: No visual loss, blurred vision, double vision or yellow sclerae.No hearing loss, sneezing, congestion, runny nose or sore throat.  SKIN: No rash or itching.  CARDIOVASCULAR:  RESPIRATORY: No shortness of breath, cough or sputum.  GASTROINTESTINAL: No anorexia, nausea, vomiting or diarrhea. No  abdominal pain or blood.  GENITOURINARY: No burning on urination, no polyuria NEUROLOGICAL: No headache, dizziness, syncope, paralysis, ataxia, numbness or tingling in the extremities. No change in bowel or bladder control.  MUSCULOSKELETAL: No muscle, back pain, joint pain or stiffness.  LYMPHATICS: No enlarged nodes. No history of splenectomy.  PSYCHIATRIC: No history of depression or anxiety.  ENDOCRINOLOGIC: No reports of sweating, cold or heat intolerance. No polyuria or polydipsia.  Marland Kitchen  Physical Examination There were no vitals filed for this visit. There were no vitals filed for this visit.  Gen: resting comfortably, no acute distress HEENT: no scleral icterus, pupils equal round and reactive, no palptable cervical adenopathy,  CV Resp: Clear to auscultation bilaterally GI: abdomen is soft, non-tender, non-distended, normal bowel sounds, no hepatosplenomegaly MSK: extremities are warm, no edema.  Skin: warm, no rash Neuro:  no focal deficits Psych: appropriate affect   Diagnostic Studies 09/2015 echo Study Conclusions  - Left ventricle: The cavity size was normal. Wall thickness was  increased in a pattern of mild LVH. Systolic function was normal.  The estimated ejection fraction was in the range of 60% to 65%.  Diastolic function is abnormal, indeterminate grade. Wall motion  was normal; there were no regional wall motion abnormalities. - Aortic valve: Mildly calcified annulus. Trileaflet; mildly  thickened leaflets. Valve area (VTI): 1.52 cm^2. Valve area  (Vmax): 1.43 cm^2. - Mitral valve: Mildly calcified annulus. Normal thickness leaflets  . - Technically adequate study.   11/2015 CTA neck IMPRESSION: 1. 75% stenosis of the left internal carotid artery at the bifurcation secondary to prominent posterior lateral soft tissue neck calcified plaque. 2. Atherosclerotic calcifications at the right carotid bifurcation and origins the great vessels without  other focal stenoses. 3. 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 4. Multilevel spondylosis of the cervical spine is most pronounced at C4-5 and C5-6.     Assessment and Plan   1. HTN  - elevated in clinic, suspect some component of white coat HTN. Previous appointments she has been well controlled - monitor at this time, follow bp trend.   2. LE edema - continue diuretics and compressoin stockings.   3. Carotid stenosis - continue to monitor. Followed by vascular  4. Dysphagia - refer to GI   F/u 6 months     Arnoldo Lenis, M.D., F.A.C.C.

## 2017-04-01 ENCOUNTER — Encounter: Payer: Self-pay | Admitting: Cardiology

## 2017-04-07 DIAGNOSIS — S82841D Displaced bimalleolar fracture of right lower leg, subsequent encounter for closed fracture with routine healing: Secondary | ICD-10-CM | POA: Diagnosis not present

## 2017-04-23 DIAGNOSIS — E1165 Type 2 diabetes mellitus with hyperglycemia: Secondary | ICD-10-CM | POA: Diagnosis not present

## 2017-04-23 DIAGNOSIS — Z299 Encounter for prophylactic measures, unspecified: Secondary | ICD-10-CM | POA: Diagnosis not present

## 2017-04-23 DIAGNOSIS — I10 Essential (primary) hypertension: Secondary | ICD-10-CM | POA: Diagnosis not present

## 2017-04-23 DIAGNOSIS — N183 Chronic kidney disease, stage 3 (moderate): Secondary | ICD-10-CM | POA: Diagnosis not present

## 2017-04-23 DIAGNOSIS — Z23 Encounter for immunization: Secondary | ICD-10-CM | POA: Diagnosis not present

## 2017-04-23 DIAGNOSIS — E1142 Type 2 diabetes mellitus with diabetic polyneuropathy: Secondary | ICD-10-CM | POA: Diagnosis not present

## 2017-04-23 DIAGNOSIS — E1122 Type 2 diabetes mellitus with diabetic chronic kidney disease: Secondary | ICD-10-CM | POA: Diagnosis not present

## 2017-04-23 DIAGNOSIS — Z6834 Body mass index (BMI) 34.0-34.9, adult: Secondary | ICD-10-CM | POA: Diagnosis not present

## 2017-04-28 ENCOUNTER — Ambulatory Visit (INDEPENDENT_AMBULATORY_CARE_PROVIDER_SITE_OTHER): Payer: Medicare Other | Admitting: Orthopaedic Surgery

## 2017-05-05 ENCOUNTER — Encounter (INDEPENDENT_AMBULATORY_CARE_PROVIDER_SITE_OTHER): Payer: Self-pay | Admitting: Orthopaedic Surgery

## 2017-05-05 ENCOUNTER — Ambulatory Visit (INDEPENDENT_AMBULATORY_CARE_PROVIDER_SITE_OTHER): Payer: Medicare Other | Admitting: Orthopaedic Surgery

## 2017-05-05 VITALS — Resp 12 | Ht 66.0 in | Wt 220.0 lb

## 2017-05-05 DIAGNOSIS — M25572 Pain in left ankle and joints of left foot: Secondary | ICD-10-CM

## 2017-05-05 DIAGNOSIS — M170 Bilateral primary osteoarthritis of knee: Secondary | ICD-10-CM

## 2017-05-05 DIAGNOSIS — I6523 Occlusion and stenosis of bilateral carotid arteries: Secondary | ICD-10-CM

## 2017-05-05 DIAGNOSIS — M1712 Unilateral primary osteoarthritis, left knee: Secondary | ICD-10-CM | POA: Diagnosis not present

## 2017-05-05 DIAGNOSIS — M1711 Unilateral primary osteoarthritis, right knee: Secondary | ICD-10-CM

## 2017-05-05 MED ORDER — LIDOCAINE HCL 1 % IJ SOLN
5.0000 mL | INTRAMUSCULAR | Status: AC | PRN
  Administered 2017-05-05: 5 mL

## 2017-05-05 MED ORDER — BUPIVACAINE HCL 0.5 % IJ SOLN
3.0000 mL | INTRAMUSCULAR | Status: AC | PRN
  Administered 2017-05-05: 3 mL via INTRA_ARTICULAR

## 2017-05-05 MED ORDER — METHYLPREDNISOLONE ACETATE 40 MG/ML IJ SUSP
80.0000 mg | INTRAMUSCULAR | Status: AC | PRN
  Administered 2017-05-05: 80 mg

## 2017-05-05 NOTE — Progress Notes (Signed)
Office Visit Note   Patient: Rhonda Mejia           Date of Birth: 04/24/1929           MRN: [id number] Visit Date: 05/05/2017              Requested by: Rhonda Blitz, MD [address] [place], [place] [address] PCP: Rhonda Blitz, MD   Assessment & Plan: Visit Diagnoses:  1. Bilateral primary osteoarthritis of knee     Plan: Cortisone injection left knee. Office 2 weeks to consider injecting right knee  Follow-Up Instructions: Return in about 2 weeks (around 05/19/2017).   Orders:  No orders of the defined types were placed in this encounter.  No orders of the defined types were placed in this encounter.     Procedures: Large Joint Inj Date/Time: 05/05/2017 1:22 PM Performed by: Rhonda Mejia Authorized by: Rhonda Mejia   Consent Given by:  Patient Timeout: prior to procedure the correct patient, procedure, and site was verified   Indications:  Pain and joint swelling Location:  Knee Site:  L knee Prep: patient was prepped and draped in usual sterile fashion   Needle Size:  25 G Needle Length:  1.5 inches Approach:  Anteromedial Ultrasound Guidance: No   Fluoroscopic Guidance: No   Arthrogram: No   Medications:  5 mL lidocaine 1 %; 80 mg methylPREDNISolone acetate 40 MG/ML; 3 mL bupivacaine 0.5 % Aspiration Attempted: No   Patient tolerance:  Patient tolerated the procedure well with no immediate complications     Clinical Data: No additional findings.   Subjective: Chief Complaint  Patient presents with  . Left Knee - Pain, Edema    Rhonda Mejia is an 83 y o that is here for bilateral knee. Left>right. She broke her right ankle 3 months ago and has been in nursing facility and is leaving tomorrow. She ambulates with a walker.  Recently sustained a displaced bimalleolar fracture of her right ankle treated by Rhonda Mejia at [facility] [place] [facility] with open reduction internal fixation. Unfortunately she had some wound breakdown along the lateral incision  and has been receiving wound care postoperatively. She's been at the nursing facility since late July and hopes to be going home in the near future. She does use a walker for ambulation and has had an exacerbation of the arthritis in both of her knees  HPI  Review of Systems  Constitutional: Negative for chills, fatigue and fever.  Eyes: Negative for itching.  Respiratory: Negative for chest tightness and shortness of breath.   Cardiovascular: Positive for leg swelling. Negative for chest pain and palpitations.  Gastrointestinal: Negative for blood in stool, constipation and diarrhea.  Endocrine: Negative for polyuria.  Genitourinary: Negative for dysuria.  Musculoskeletal: Positive for back pain. Negative for joint swelling, neck pain and neck stiffness.  Allergic/Immunologic: Negative for immunocompromised state.  Neurological: Negative for dizziness and numbness.  Hematological: Does not bruise/bleed easily.  Psychiatric/Behavioral: The patient is not nervous/anxious.      Objective: Vital Signs: Resp 12   Ht 5\' 6"  (1.676 m)   Wt 220 lb (99.8 kg)   BMI 35.51 kg/m   Physical Exam  Ortho Exam awake alert and oriented 3 comfortable sitting and I shortness of breath or chest pain. Hypertrophic changes about both knees. Diffuse swelling of her lower right lower extremity related to her ankle fracture. I did check the wound under sterile techniques and there has been some breakdown there is good granulation  tissue beneath no obvious drainage and no redness. There is no exposed hardware. She does receive skin care at the nursing facility. Bilateral patella crepitation and predominantly medial joint pain consistent with her arthritis previously diagnosed by plain films  No specialty comments available.  Imaging: No results found.   PMFS History: Patient Active Problem List   Diagnosis Date Noted  . Dysphagia, unspecified(787.20) 06/07/2013  . Unspecified hypothyroidism 10/10/2012    . High cholesterol 10/10/2012  . Essential hypertension, benign 10/10/2012  . Rectal bleeding 10/10/2012  . Pill dysphagia 10/10/2012  . Family hx of colon cancer 10/10/2012  . Anemia 10/10/2012   Past Medical History:  Diagnosis Date  . Cancer [facility])    left breast cancer  . Carotid artery occlusion   . Diabetes mellitus without complication (HCC)    diet controlled  . Family hx of colon cancer   . GERD (gastroesophageal reflux disease)   . High cholesterol   . History of gout   . Hypertension    for over 20 yrs  . Hypothyroid   . Neuropathy   . Pill dysphagia     Family History  Problem Relation Age of Onset  . Colon cancer Brother     Past Surgical History:  Procedure Laterality Date  . BACK SURGERY     5 rods and 5 screws and cage in back, has had 4 back surgeries.  Marland Kitchen BREAST LUMPECTOMY     left breast  . CATARACT EXTRACTION W/PHACO Left 12/31/2014   Procedure: CATARACT EXTRACTION PHACO AND INTRAOCULAR LENS PLACEMENT (IOC);  Surgeon: Rhonda Branch, MD;  Location: [facility];  Service: Ophthalmology;  Laterality: Left;  CDE:8.45  . CATARACT EXTRACTION W/PHACO Right 01/28/2015   Procedure: CATARACT EXTRACTION PHACO AND INTRAOCULAR LENS PLACEMENT RIGHT EYE;  Surgeon: Rhonda Branch, MD;  Location: [facility];  Service: Ophthalmology;  Laterality: Right;  CDE:4.92  . CHOLECYSTECTOMY    . COLONOSCOPY N/A 10/21/2012   Procedure: COLONOSCOPY;  Surgeon: Rhonda Houston, MD;  [facility];  Service: Endoscopy;  Laterality: N/A;  . ESOPHAGOGASTRODUODENOSCOPY (EGD) WITH ESOPHAGEAL DILATION N/A 06/09/2013   Procedure: ESOPHAGOGASTRODUODENOSCOPY (EGD) WITH ESOPHAGEAL DILATION;  Surgeon: Rhonda Houston, MD;  [facility];  Service: Endoscopy;  Laterality: N/A;  125  . KNEE SURGERY Right    arthroscopy x2  . SPINE SURGERY     3 surgeries by Rhonda Mejia,  Last surgery 2 years ago by Rhonda Mejia in [place] History  . Not on file.   Social  History Main Topics  . Smoking status: Never Smoker  . Smokeless tobacco: Never Used  . Alcohol use No  . Drug use: No  . Sexual activity: Yes    Birth control/ protection: Post-menopausal

## 2017-05-07 DIAGNOSIS — E119 Type 2 diabetes mellitus without complications: Secondary | ICD-10-CM | POA: Diagnosis not present

## 2017-05-07 DIAGNOSIS — S8291XA Unspecified fracture of right lower leg, initial encounter for closed fracture: Secondary | ICD-10-CM | POA: Diagnosis not present

## 2017-05-08 DIAGNOSIS — K219 Gastro-esophageal reflux disease without esophagitis: Secondary | ICD-10-CM | POA: Diagnosis not present

## 2017-05-08 DIAGNOSIS — E119 Type 2 diabetes mellitus without complications: Secondary | ICD-10-CM | POA: Diagnosis not present

## 2017-05-08 DIAGNOSIS — M1991 Primary osteoarthritis, unspecified site: Secondary | ICD-10-CM | POA: Diagnosis not present

## 2017-05-08 DIAGNOSIS — I10 Essential (primary) hypertension: Secondary | ICD-10-CM | POA: Diagnosis not present

## 2017-05-08 DIAGNOSIS — S82891D Other fracture of right lower leg, subsequent encounter for closed fracture with routine healing: Secondary | ICD-10-CM | POA: Diagnosis not present

## 2017-05-08 DIAGNOSIS — Z9181 History of falling: Secondary | ICD-10-CM | POA: Diagnosis not present

## 2017-05-08 DIAGNOSIS — Z742 Need for assistance at home and no other household member able to render care: Secondary | ICD-10-CM | POA: Diagnosis not present

## 2017-05-08 DIAGNOSIS — M109 Gout, unspecified: Secondary | ICD-10-CM | POA: Diagnosis not present

## 2017-05-08 DIAGNOSIS — M722 Plantar fascial fibromatosis: Secondary | ICD-10-CM | POA: Diagnosis not present

## 2017-05-10 DIAGNOSIS — M1991 Primary osteoarthritis, unspecified site: Secondary | ICD-10-CM | POA: Diagnosis not present

## 2017-05-10 DIAGNOSIS — I10 Essential (primary) hypertension: Secondary | ICD-10-CM | POA: Diagnosis not present

## 2017-05-10 DIAGNOSIS — E119 Type 2 diabetes mellitus without complications: Secondary | ICD-10-CM | POA: Diagnosis not present

## 2017-05-10 DIAGNOSIS — S82841D Displaced bimalleolar fracture of right lower leg, subsequent encounter for closed fracture with routine healing: Secondary | ICD-10-CM | POA: Diagnosis not present

## 2017-05-10 DIAGNOSIS — M722 Plantar fascial fibromatosis: Secondary | ICD-10-CM | POA: Diagnosis not present

## 2017-05-10 DIAGNOSIS — S82891D Other fracture of right lower leg, subsequent encounter for closed fracture with routine healing: Secondary | ICD-10-CM | POA: Diagnosis not present

## 2017-05-10 DIAGNOSIS — M109 Gout, unspecified: Secondary | ICD-10-CM | POA: Diagnosis not present

## 2017-05-11 DIAGNOSIS — I10 Essential (primary) hypertension: Secondary | ICD-10-CM | POA: Diagnosis not present

## 2017-05-11 DIAGNOSIS — M1991 Primary osteoarthritis, unspecified site: Secondary | ICD-10-CM | POA: Diagnosis not present

## 2017-05-11 DIAGNOSIS — S82891D Other fracture of right lower leg, subsequent encounter for closed fracture with routine healing: Secondary | ICD-10-CM | POA: Diagnosis not present

## 2017-05-11 DIAGNOSIS — M722 Plantar fascial fibromatosis: Secondary | ICD-10-CM | POA: Diagnosis not present

## 2017-05-11 DIAGNOSIS — M109 Gout, unspecified: Secondary | ICD-10-CM | POA: Diagnosis not present

## 2017-05-11 DIAGNOSIS — E119 Type 2 diabetes mellitus without complications: Secondary | ICD-10-CM | POA: Diagnosis not present

## 2017-05-12 DIAGNOSIS — S82891D Other fracture of right lower leg, subsequent encounter for closed fracture with routine healing: Secondary | ICD-10-CM | POA: Diagnosis not present

## 2017-05-12 DIAGNOSIS — M109 Gout, unspecified: Secondary | ICD-10-CM | POA: Diagnosis not present

## 2017-05-12 DIAGNOSIS — M722 Plantar fascial fibromatosis: Secondary | ICD-10-CM | POA: Diagnosis not present

## 2017-05-12 DIAGNOSIS — M1991 Primary osteoarthritis, unspecified site: Secondary | ICD-10-CM | POA: Diagnosis not present

## 2017-05-12 DIAGNOSIS — I10 Essential (primary) hypertension: Secondary | ICD-10-CM | POA: Diagnosis not present

## 2017-05-12 DIAGNOSIS — E119 Type 2 diabetes mellitus without complications: Secondary | ICD-10-CM | POA: Diagnosis not present

## 2017-05-14 DIAGNOSIS — E119 Type 2 diabetes mellitus without complications: Secondary | ICD-10-CM | POA: Diagnosis not present

## 2017-05-14 DIAGNOSIS — M1991 Primary osteoarthritis, unspecified site: Secondary | ICD-10-CM | POA: Diagnosis not present

## 2017-05-14 DIAGNOSIS — M109 Gout, unspecified: Secondary | ICD-10-CM | POA: Diagnosis not present

## 2017-05-14 DIAGNOSIS — I10 Essential (primary) hypertension: Secondary | ICD-10-CM | POA: Diagnosis not present

## 2017-05-14 DIAGNOSIS — M722 Plantar fascial fibromatosis: Secondary | ICD-10-CM | POA: Diagnosis not present

## 2017-05-14 DIAGNOSIS — S82891D Other fracture of right lower leg, subsequent encounter for closed fracture with routine healing: Secondary | ICD-10-CM | POA: Diagnosis not present

## 2017-05-16 DIAGNOSIS — E119 Type 2 diabetes mellitus without complications: Secondary | ICD-10-CM | POA: Diagnosis not present

## 2017-05-16 DIAGNOSIS — T8141XA Infection following a procedure, superficial incisional surgical site, initial encounter: Secondary | ICD-10-CM | POA: Diagnosis not present

## 2017-05-16 DIAGNOSIS — I10 Essential (primary) hypertension: Secondary | ICD-10-CM | POA: Diagnosis not present

## 2017-05-16 DIAGNOSIS — Z853 Personal history of malignant neoplasm of breast: Secondary | ICD-10-CM | POA: Diagnosis not present

## 2017-05-16 DIAGNOSIS — T8140XA Infection following a procedure, unspecified, initial encounter: Secondary | ICD-10-CM | POA: Diagnosis not present

## 2017-05-16 DIAGNOSIS — Z7982 Long term (current) use of aspirin: Secondary | ICD-10-CM | POA: Diagnosis not present

## 2017-05-16 DIAGNOSIS — Z79899 Other long term (current) drug therapy: Secondary | ICD-10-CM | POA: Diagnosis not present

## 2017-05-16 DIAGNOSIS — E039 Hypothyroidism, unspecified: Secondary | ICD-10-CM | POA: Diagnosis not present

## 2017-05-16 DIAGNOSIS — M109 Gout, unspecified: Secondary | ICD-10-CM | POA: Diagnosis not present

## 2017-05-16 DIAGNOSIS — L03115 Cellulitis of right lower limb: Secondary | ICD-10-CM | POA: Diagnosis not present

## 2017-05-16 DIAGNOSIS — G629 Polyneuropathy, unspecified: Secondary | ICD-10-CM | POA: Diagnosis not present

## 2017-05-16 DIAGNOSIS — K219 Gastro-esophageal reflux disease without esophagitis: Secondary | ICD-10-CM | POA: Diagnosis not present

## 2017-05-17 DIAGNOSIS — S82891D Other fracture of right lower leg, subsequent encounter for closed fracture with routine healing: Secondary | ICD-10-CM | POA: Diagnosis not present

## 2017-05-17 DIAGNOSIS — M722 Plantar fascial fibromatosis: Secondary | ICD-10-CM | POA: Diagnosis not present

## 2017-05-17 DIAGNOSIS — I10 Essential (primary) hypertension: Secondary | ICD-10-CM | POA: Diagnosis not present

## 2017-05-17 DIAGNOSIS — M1991 Primary osteoarthritis, unspecified site: Secondary | ICD-10-CM | POA: Diagnosis not present

## 2017-05-17 DIAGNOSIS — M109 Gout, unspecified: Secondary | ICD-10-CM | POA: Diagnosis not present

## 2017-05-17 DIAGNOSIS — E119 Type 2 diabetes mellitus without complications: Secondary | ICD-10-CM | POA: Diagnosis not present

## 2017-05-19 ENCOUNTER — Ambulatory Visit (INDEPENDENT_AMBULATORY_CARE_PROVIDER_SITE_OTHER): Payer: Medicare Other | Admitting: Orthopaedic Surgery

## 2017-05-19 DIAGNOSIS — I10 Essential (primary) hypertension: Secondary | ICD-10-CM | POA: Diagnosis not present

## 2017-05-19 DIAGNOSIS — M1991 Primary osteoarthritis, unspecified site: Secondary | ICD-10-CM | POA: Diagnosis not present

## 2017-05-19 DIAGNOSIS — S82891D Other fracture of right lower leg, subsequent encounter for closed fracture with routine healing: Secondary | ICD-10-CM | POA: Diagnosis not present

## 2017-05-19 DIAGNOSIS — M109 Gout, unspecified: Secondary | ICD-10-CM | POA: Diagnosis not present

## 2017-05-19 DIAGNOSIS — E119 Type 2 diabetes mellitus without complications: Secondary | ICD-10-CM | POA: Diagnosis not present

## 2017-05-19 DIAGNOSIS — M722 Plantar fascial fibromatosis: Secondary | ICD-10-CM | POA: Diagnosis not present

## 2017-05-20 DIAGNOSIS — T148XXD Other injury of unspecified body region, subsequent encounter: Secondary | ICD-10-CM | POA: Diagnosis not present

## 2017-05-21 DIAGNOSIS — E119 Type 2 diabetes mellitus without complications: Secondary | ICD-10-CM | POA: Diagnosis not present

## 2017-05-21 DIAGNOSIS — M109 Gout, unspecified: Secondary | ICD-10-CM | POA: Diagnosis not present

## 2017-05-21 DIAGNOSIS — S82891D Other fracture of right lower leg, subsequent encounter for closed fracture with routine healing: Secondary | ICD-10-CM | POA: Diagnosis not present

## 2017-05-21 DIAGNOSIS — I10 Essential (primary) hypertension: Secondary | ICD-10-CM | POA: Diagnosis not present

## 2017-05-21 DIAGNOSIS — M722 Plantar fascial fibromatosis: Secondary | ICD-10-CM | POA: Diagnosis not present

## 2017-05-21 DIAGNOSIS — M1991 Primary osteoarthritis, unspecified site: Secondary | ICD-10-CM | POA: Diagnosis not present

## 2017-05-22 DIAGNOSIS — I10 Essential (primary) hypertension: Secondary | ICD-10-CM | POA: Diagnosis not present

## 2017-05-22 DIAGNOSIS — S82891D Other fracture of right lower leg, subsequent encounter for closed fracture with routine healing: Secondary | ICD-10-CM | POA: Diagnosis not present

## 2017-05-22 DIAGNOSIS — M109 Gout, unspecified: Secondary | ICD-10-CM | POA: Diagnosis not present

## 2017-05-22 DIAGNOSIS — M1991 Primary osteoarthritis, unspecified site: Secondary | ICD-10-CM | POA: Diagnosis not present

## 2017-05-22 DIAGNOSIS — E119 Type 2 diabetes mellitus without complications: Secondary | ICD-10-CM | POA: Diagnosis not present

## 2017-05-22 DIAGNOSIS — M722 Plantar fascial fibromatosis: Secondary | ICD-10-CM | POA: Diagnosis not present

## 2017-05-24 DIAGNOSIS — M109 Gout, unspecified: Secondary | ICD-10-CM | POA: Diagnosis not present

## 2017-05-24 DIAGNOSIS — M722 Plantar fascial fibromatosis: Secondary | ICD-10-CM | POA: Diagnosis not present

## 2017-05-24 DIAGNOSIS — E119 Type 2 diabetes mellitus without complications: Secondary | ICD-10-CM | POA: Diagnosis not present

## 2017-05-24 DIAGNOSIS — I10 Essential (primary) hypertension: Secondary | ICD-10-CM | POA: Diagnosis not present

## 2017-05-24 DIAGNOSIS — S82891D Other fracture of right lower leg, subsequent encounter for closed fracture with routine healing: Secondary | ICD-10-CM | POA: Diagnosis not present

## 2017-05-24 DIAGNOSIS — M1991 Primary osteoarthritis, unspecified site: Secondary | ICD-10-CM | POA: Diagnosis not present

## 2017-05-25 DIAGNOSIS — E039 Hypothyroidism, unspecified: Secondary | ICD-10-CM | POA: Diagnosis not present

## 2017-05-25 DIAGNOSIS — I10 Essential (primary) hypertension: Secondary | ICD-10-CM | POA: Diagnosis not present

## 2017-05-25 DIAGNOSIS — T8131XA Disruption of external operation (surgical) wound, not elsewhere classified, initial encounter: Secondary | ICD-10-CM | POA: Diagnosis not present

## 2017-05-25 DIAGNOSIS — F329 Major depressive disorder, single episode, unspecified: Secondary | ICD-10-CM | POA: Diagnosis not present

## 2017-05-25 DIAGNOSIS — Z79899 Other long term (current) drug therapy: Secondary | ICD-10-CM | POA: Diagnosis not present

## 2017-05-25 DIAGNOSIS — I872 Venous insufficiency (chronic) (peripheral): Secondary | ICD-10-CM | POA: Diagnosis not present

## 2017-05-25 DIAGNOSIS — Z880 Allergy status to penicillin: Secondary | ICD-10-CM | POA: Diagnosis not present

## 2017-05-27 DIAGNOSIS — Z79899 Other long term (current) drug therapy: Secondary | ICD-10-CM | POA: Diagnosis not present

## 2017-05-27 DIAGNOSIS — E039 Hypothyroidism, unspecified: Secondary | ICD-10-CM | POA: Diagnosis not present

## 2017-05-27 DIAGNOSIS — I10 Essential (primary) hypertension: Secondary | ICD-10-CM | POA: Diagnosis not present

## 2017-05-27 DIAGNOSIS — T8131XA Disruption of external operation (surgical) wound, not elsewhere classified, initial encounter: Secondary | ICD-10-CM | POA: Diagnosis not present

## 2017-05-27 DIAGNOSIS — F329 Major depressive disorder, single episode, unspecified: Secondary | ICD-10-CM | POA: Diagnosis not present

## 2017-05-27 DIAGNOSIS — L97313 Non-pressure chronic ulcer of right ankle with necrosis of muscle: Secondary | ICD-10-CM | POA: Diagnosis not present

## 2017-05-27 DIAGNOSIS — I872 Venous insufficiency (chronic) (peripheral): Secondary | ICD-10-CM | POA: Diagnosis not present

## 2017-05-31 DIAGNOSIS — S82891D Other fracture of right lower leg, subsequent encounter for closed fracture with routine healing: Secondary | ICD-10-CM | POA: Diagnosis not present

## 2017-05-31 DIAGNOSIS — M722 Plantar fascial fibromatosis: Secondary | ICD-10-CM | POA: Diagnosis not present

## 2017-05-31 DIAGNOSIS — E119 Type 2 diabetes mellitus without complications: Secondary | ICD-10-CM | POA: Diagnosis not present

## 2017-05-31 DIAGNOSIS — M109 Gout, unspecified: Secondary | ICD-10-CM | POA: Diagnosis not present

## 2017-05-31 DIAGNOSIS — I10 Essential (primary) hypertension: Secondary | ICD-10-CM | POA: Diagnosis not present

## 2017-05-31 DIAGNOSIS — M1991 Primary osteoarthritis, unspecified site: Secondary | ICD-10-CM | POA: Diagnosis not present

## 2017-06-02 DIAGNOSIS — Z79899 Other long term (current) drug therapy: Secondary | ICD-10-CM | POA: Diagnosis not present

## 2017-06-02 DIAGNOSIS — I872 Venous insufficiency (chronic) (peripheral): Secondary | ICD-10-CM | POA: Diagnosis not present

## 2017-06-02 DIAGNOSIS — F329 Major depressive disorder, single episode, unspecified: Secondary | ICD-10-CM | POA: Diagnosis not present

## 2017-06-02 DIAGNOSIS — I10 Essential (primary) hypertension: Secondary | ICD-10-CM | POA: Diagnosis not present

## 2017-06-02 DIAGNOSIS — T8131XA Disruption of external operation (surgical) wound, not elsewhere classified, initial encounter: Secondary | ICD-10-CM | POA: Diagnosis not present

## 2017-06-02 DIAGNOSIS — E039 Hypothyroidism, unspecified: Secondary | ICD-10-CM | POA: Diagnosis not present

## 2017-06-02 DIAGNOSIS — L97313 Non-pressure chronic ulcer of right ankle with necrosis of muscle: Secondary | ICD-10-CM | POA: Diagnosis not present

## 2017-06-09 DIAGNOSIS — I10 Essential (primary) hypertension: Secondary | ICD-10-CM | POA: Diagnosis not present

## 2017-06-09 DIAGNOSIS — L97313 Non-pressure chronic ulcer of right ankle with necrosis of muscle: Secondary | ICD-10-CM | POA: Diagnosis not present

## 2017-06-09 DIAGNOSIS — F329 Major depressive disorder, single episode, unspecified: Secondary | ICD-10-CM | POA: Diagnosis not present

## 2017-06-09 DIAGNOSIS — Z79899 Other long term (current) drug therapy: Secondary | ICD-10-CM | POA: Diagnosis not present

## 2017-06-09 DIAGNOSIS — E039 Hypothyroidism, unspecified: Secondary | ICD-10-CM | POA: Diagnosis not present

## 2017-06-09 DIAGNOSIS — T8131XA Disruption of external operation (surgical) wound, not elsewhere classified, initial encounter: Secondary | ICD-10-CM | POA: Diagnosis not present

## 2017-06-09 DIAGNOSIS — I872 Venous insufficiency (chronic) (peripheral): Secondary | ICD-10-CM | POA: Diagnosis not present

## 2017-06-16 ENCOUNTER — Ambulatory Visit: Payer: Medicare Other | Admitting: Family

## 2017-06-16 ENCOUNTER — Encounter (HOSPITAL_COMMUNITY): Payer: Medicare Other

## 2017-06-16 ENCOUNTER — Ambulatory Visit: Payer: Medicare Other | Admitting: Vascular Surgery

## 2017-06-16 DIAGNOSIS — E119 Type 2 diabetes mellitus without complications: Secondary | ICD-10-CM | POA: Diagnosis not present

## 2017-06-16 DIAGNOSIS — I10 Essential (primary) hypertension: Secondary | ICD-10-CM | POA: Diagnosis not present

## 2017-06-17 DIAGNOSIS — I10 Essential (primary) hypertension: Secondary | ICD-10-CM | POA: Diagnosis not present

## 2017-06-17 DIAGNOSIS — F329 Major depressive disorder, single episode, unspecified: Secondary | ICD-10-CM | POA: Diagnosis not present

## 2017-06-17 DIAGNOSIS — Z79899 Other long term (current) drug therapy: Secondary | ICD-10-CM | POA: Diagnosis not present

## 2017-06-17 DIAGNOSIS — T8131XD Disruption of external operation (surgical) wound, not elsewhere classified, subsequent encounter: Secondary | ICD-10-CM | POA: Diagnosis not present

## 2017-06-17 DIAGNOSIS — T8131XA Disruption of external operation (surgical) wound, not elsewhere classified, initial encounter: Secondary | ICD-10-CM | POA: Diagnosis not present

## 2017-06-17 DIAGNOSIS — I872 Venous insufficiency (chronic) (peripheral): Secondary | ICD-10-CM | POA: Diagnosis not present

## 2017-06-17 DIAGNOSIS — E039 Hypothyroidism, unspecified: Secondary | ICD-10-CM | POA: Diagnosis not present

## 2017-06-21 ENCOUNTER — Other Ambulatory Visit: Payer: Self-pay

## 2017-06-21 ENCOUNTER — Ambulatory Visit (INDEPENDENT_AMBULATORY_CARE_PROVIDER_SITE_OTHER): Payer: Medicare Other | Admitting: Cardiology

## 2017-06-21 ENCOUNTER — Encounter: Payer: Self-pay | Admitting: Cardiology

## 2017-06-21 VITALS — BP 180/80 | HR 87 | Ht 65.0 in | Wt 203.0 lb

## 2017-06-21 DIAGNOSIS — R600 Localized edema: Secondary | ICD-10-CM

## 2017-06-21 DIAGNOSIS — I6523 Occlusion and stenosis of bilateral carotid arteries: Secondary | ICD-10-CM

## 2017-06-21 DIAGNOSIS — I10 Essential (primary) hypertension: Secondary | ICD-10-CM | POA: Diagnosis not present

## 2017-06-21 MED ORDER — FUROSEMIDE 40 MG PO TABS: 60.0000 mg | ORAL_TABLET | Freq: Every day | ORAL | 3 refills | Status: DC

## 2017-06-21 NOTE — Patient Instructions (Addendum)
Medication Instructions:   Increase Lasix to 60mg  daily.  Continue all other medications.    Labwork:  BMET, Magnesium - orders given today.  Due in 2 weeks.  Office will contact with results via phone or letter.    Testing/Procedures: none  Follow-Up: 3 months   Any Other Special Instructions Will Be Listed Below (If Applicable).  Please call the office in 1 week with update on swelling.   Your physician has requested that you regularly monitor and record your blood pressure readings at home. Please take readings approximately 2 hours after medication x 1 week.  Return log to office for MD review.    If you need a refill on your cardiac medications before your next appointment, please call your pharmacy.

## 2017-06-21 NOTE — Progress Notes (Signed)
Clinical Summary Ms. Nofsinger is a 83 y.o.female seen today for follow up of the following medical problems.  1. HTN - she reports normal bp's daily at nursing home as recent as October - compliant with meds    2. LE edema - echo 02/2015 difficult study ,with LVEF 50-55%, normal RV function. Diastolic function not described.  - norvasc stopped at last visit due to LE edema. Lasix increased to 40mg  daily. Edema has improved  - repeat echo 09/2015 LVEF 60-65%, abnormal diastolic function grade indeterminate   - followed by vascular for chronic venous insufficiency - unable to we compression stockings due to ankle fracture  3. Carotid stenosis - carotid US 09/2015 with concern for distal left CCA stenosis, 1-39% right ICA stenosis.  - 11/2015 CTA neck with 97% LICA stenosis, focal contained dissecion left ICA, no significant RICA disease - followed by vascular. From there consultation CTA results thought to be misleading due to heavy calficications and fairly mild velocities by carotid US.   4. Back pain - followed by Dr Carloyn Manner  5. DM2 -she is  diet controlled, followed by pcp    Past Medical History:  Diagnosis Date  . Cancer [facility])    left breast cancer  . Carotid artery occlusion   . Diabetes mellitus without complication (HCC)    diet controlled  . Family hx of colon cancer   . GERD (gastroesophageal reflux disease)   . High cholesterol   . History of gout   . Hypertension    for over 20 yrs  . Hypothyroid   . Neuropathy   . Pill dysphagia      Allergies  Allergen Reactions  . Lipitor [Atorvastatin] Other (See Comments)    Didn't agree with stomach  . Penicillins     Rash   . Sulfa Antibiotics     Unknown reaction      Current Outpatient Medications  Medication Sig Dispense Refill  . allopurinol (ZYLOPRIM) 300 MG tablet Take 300 mg by mouth daily.    . AMITIZA 24 MCG capsule Take 24 mcg by mouth daily.  1  . aspirin EC 81 MG tablet Take  81 mg by mouth daily.    Marland Kitchen atorvastatin (LIPITOR) 40 MG tablet Take 1 tablet (40 mg total) by mouth daily. (Patient not taking: Reported on 06/10/2016) 90 tablet 3  . Cyanocobalamin (B-12) 2500 MCG TABS Take 2 tablets by mouth daily.     Marland Kitchen docusate sodium (COLACE) 100 MG capsule Take 100 mg by mouth daily as needed for mild constipation.    . folic acid (FOLVITE) 1 MG tablet Take 1 mg by mouth daily.    . furosemide (LASIX) 40 MG tablet Take 20 mg by mouth daily.    . furosemide (LASIX) 40 MG tablet TAKE ONE TABLET BY MOUTH DAILY. 90 tablet 1  . gabapentin (NEURONTIN) 300 MG capsule Take 300 mg by mouth 2 (two) times daily.  2  . hydrALAZINE (APRESOLINE) 25 MG tablet Take 25 mg by mouth 2 (two) times daily.     Marland Kitchen levothyroxine (SYNTHROID, LEVOTHROID) 100 MCG tablet Take 100 mcg by mouth daily.    Marland Kitchen lisinopril (PRINIVIL,ZESTRIL) 40 MG tablet Take 40 mg by mouth daily.    . metoprolol succinate (TOPROL-XL) 50 MG 24 hr tablet Take 50 mg by mouth daily.  6  . metoprolol succinate (TOPROL-XL) 50 MG 24 hr tablet Take 50 mg by mouth daily. Take with or immediately following a meal.    .  Omega-3 Fatty Acids (FISH OIL PO) Take 1 capsule by mouth daily.    Marland Kitchen omeprazole (PRILOSEC) 40 MG capsule Take 40 mg by mouth daily.    Marland Kitchen pyridOXINE (VITAMIN B-6) 100 MG tablet Take 100 mg by mouth daily.    Marland Kitchen senna (SENOKOT) 8.6 MG tablet Take 1 tablet by mouth as needed for constipation.    . traMADol (ULTRAM) 50 MG tablet Take 50 mg by mouth every 6 (six) hours as needed for pain.    Marland Kitchen venlafaxine (EFFEXOR) 75 MG tablet Take 75 mg by mouth daily.     Marland Kitchen venlafaxine XR (EFFEXOR-XR) 75 MG 24 hr capsule Take 75 mg by mouth daily.  3  . VOLTAREN 1 % GEL APPLY 2-4 GRAMS TO THE AFFECTED AREA TWICE DAILY  1   No current facility-administered medications for this visit.      Past Surgical History:  Procedure Laterality Date  . BACK SURGERY     5 rods and 5 screws and cage in back, has had 4 back surgeries.  Marland Kitchen BREAST  LUMPECTOMY     left breast  . CATARACT EXTRACTION W/PHACO Left 12/31/2014   Procedure: CATARACT EXTRACTION PHACO AND INTRAOCULAR LENS PLACEMENT (IOC);  Surgeon: Tonny Juneau Doughman, MD;  Location: [facility];  Service: Ophthalmology;  Laterality: Left;  CDE:8.45  . CATARACT EXTRACTION W/PHACO Right 01/28/2015   Procedure: CATARACT EXTRACTION PHACO AND INTRAOCULAR LENS PLACEMENT RIGHT EYE;  Surgeon: Tonny Ho Parisi, MD;  Location: [facility];  Service: Ophthalmology;  Laterality: Right;  CDE:4.92  . CHOLECYSTECTOMY    . COLONOSCOPY N/A 10/21/2012   Procedure: COLONOSCOPY;  Surgeon: Rogene Houston, MD;  [facility];  Service: Endoscopy;  Laterality: N/A;  . ESOPHAGOGASTRODUODENOSCOPY (EGD) WITH ESOPHAGEAL DILATION N/A 06/09/2013   Procedure: ESOPHAGOGASTRODUODENOSCOPY (EGD) WITH ESOPHAGEAL DILATION;  Surgeon: Rogene Houston, MD;  [facility];  Service: Endoscopy;  Laterality: N/A;  125  . KNEE SURGERY Right    arthroscopy x2  . SPINE SURGERY     3 surgeries by Dr. Jenne Campus,  Last surgery 2 years ago by Dr. Carloyn Manner in [place] Reactions  . Lipitor [Atorvastatin] Other (See Comments)    Didn't agree with stomach  . Penicillins     Rash   . Sulfa Antibiotics     Unknown reaction       Family History  Problem Relation Age of Onset  . Colon cancer Brother      Social History Ms. Wyke reports that  has never smoked. she has never used smokeless tobacco. Ms. Staib reports that she does not drink alcohol.   Review of Systems CONSTITUTIONAL: No weight loss, fever, chills, weakness or fatigue.  HEENT: Eyes: No visual loss, blurred vision, double vision or yellow sclerae.No hearing loss, sneezing, congestion, runny nose or sore throat.  SKIN: No rash or itching.  CARDIOVASCULAR: per hpi RESPIRATORY: No shortness of breath, cough or sputum.  GASTROINTESTINAL: No anorexia, nausea, vomiting or diarrhea. No abdominal pain or blood.  GENITOURINARY: No  burning on urination, no polyuria NEUROLOGICAL: No headache, dizziness, syncope, paralysis, ataxia, numbness or tingling in the extremities. No change in bowel or bladder control.  MUSCULOSKELETAL: No muscle, back pain, joint pain or stiffness.  LYMPHATICS: No enlarged nodes. No history of splenectomy.  PSYCHIATRIC: No history of depression or anxiety.  ENDOCRINOLOGIC: No reports of sweating, cold or heat intolerance. No polyuria or polydipsia.  Marland Kitchen   Physical Examination Vitals:   06/21/17  1458  BP: (!) 180/80  Pulse: 87  SpO2: 95%   Vitals:   06/21/17 1458  Weight: 203 lb (92.1 kg)  Height: 5\' 5"  (1.651 m)    Gen: resting comfortably, no acute distress HEENT: no scleral icterus, pupils equal round and reactive, no palptable cervical adenopathy,  CV: RRR, no m/r/g, no jvd Resp: Clear to auscultation bilaterally GI: abdomen is soft, non-tender, non-distended, normal bowel sounds, no hepatosplenomegaly MSK: extremities are warm, 1+bilateral edema Skin: warm, no rash Neuro:  no focal deficits Psych: appropriate affect   Diagnostic Studies 09/2015 echo Study Conclusions  - Left ventricle: The cavity size was normal. Wall thickness was  increased in a pattern of mild LVH. Systolic function was normal.  The estimated ejection fraction was in the range of 60% to 65%.  Diastolic function is abnormal, indeterminate grade. Wall motion  was normal; there were no regional wall motion abnormalities. - Aortic valve: Mildly calcified annulus. Trileaflet; mildly  thickened leaflets. Valve area (VTI): 1.52 cm^2. Valve area  (Vmax): 1.43 cm^2. - Mitral valve: Mildly calcified annulus. Normal thickness leaflets  . - Technically adequate study.   11/2015 CTA neck IMPRESSION: 1. 75% stenosis of the left internal carotid artery at the bifurcation secondary to prominent posterior lateral soft tissue neck calcified plaque. 2. Atherosclerotic calcifications at the right carotid  bifurcation and origins the great vessels without other focal stenoses. 3. 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 4. Multilevel spondylosis of the cervical spine is most pronounced at C4-5 and C5-6.    Assessment and Plan  1. HTN  - elevated in clinic, suspect some component of white coat HTN.  - she will submit bp log in 1 week, continue current meds for now  2. LE edema - ongoing edema, we will increase lasix to 60mg  daily and check BMET/Mg in 2 weeks. Call us in 1 week to update [place] on swelling and weights.        F/u 3 months      Arnoldo Lenis, M.D.

## 2017-06-22 DIAGNOSIS — I83013 Varicose veins of right lower extremity with ulcer of ankle: Secondary | ICD-10-CM | POA: Diagnosis not present

## 2017-06-22 DIAGNOSIS — T8130XA Disruption of wound, unspecified, initial encounter: Secondary | ICD-10-CM | POA: Diagnosis not present

## 2017-06-22 DIAGNOSIS — L97319 Non-pressure chronic ulcer of right ankle with unspecified severity: Secondary | ICD-10-CM | POA: Diagnosis not present

## 2017-06-22 DIAGNOSIS — T8131XD Disruption of external operation (surgical) wound, not elsewhere classified, subsequent encounter: Secondary | ICD-10-CM | POA: Diagnosis not present

## 2017-06-23 ENCOUNTER — Encounter: Payer: Self-pay | Admitting: Cardiology

## 2017-06-23 DIAGNOSIS — L570 Actinic keratosis: Secondary | ICD-10-CM | POA: Diagnosis not present

## 2017-06-23 DIAGNOSIS — L309 Dermatitis, unspecified: Secondary | ICD-10-CM | POA: Diagnosis not present

## 2017-07-01 ENCOUNTER — Telehealth: Payer: Self-pay | Admitting: *Deleted

## 2017-07-01 NOTE — Telephone Encounter (Signed)
Per LOV pt calling in BP readings and swelling. Says swelling is much better in legs - also swelling in stomach has also decreased. Pt hasn't weighed since LOV - pt will get lab work next week. Routed to provider    12/8    BP 142/66 HR 71 12/11  BP 150/71 HR 68 12/12  BP 157/77 HR 68 MORNING 12/12 BP  147/69 HR 74 EVENING

## 2017-07-05 NOTE — Telephone Encounter (Signed)
BP's remain elevated, these may come down as she loses more fluid. We will see what her labs show and make any adjustments to the lasix based on that, remind her when she is to have these done   Zandra Abts MD

## 2017-07-06 ENCOUNTER — Telehealth: Payer: Self-pay

## 2017-07-06 DIAGNOSIS — R600 Localized edema: Secondary | ICD-10-CM | POA: Diagnosis not present

## 2017-07-06 DIAGNOSIS — Z6833 Body mass index (BMI) 33.0-33.9, adult: Secondary | ICD-10-CM | POA: Diagnosis not present

## 2017-07-06 DIAGNOSIS — Z299 Encounter for prophylactic measures, unspecified: Secondary | ICD-10-CM | POA: Diagnosis not present

## 2017-07-06 DIAGNOSIS — I10 Essential (primary) hypertension: Secondary | ICD-10-CM | POA: Diagnosis not present

## 2017-07-06 DIAGNOSIS — T8130XA Disruption of wound, unspecified, initial encounter: Secondary | ICD-10-CM | POA: Diagnosis not present

## 2017-07-06 DIAGNOSIS — E1165 Type 2 diabetes mellitus with hyperglycemia: Secondary | ICD-10-CM | POA: Diagnosis not present

## 2017-07-06 DIAGNOSIS — L97319 Non-pressure chronic ulcer of right ankle with unspecified severity: Secondary | ICD-10-CM | POA: Diagnosis not present

## 2017-07-06 DIAGNOSIS — E1142 Type 2 diabetes mellitus with diabetic polyneuropathy: Secondary | ICD-10-CM | POA: Diagnosis not present

## 2017-07-06 DIAGNOSIS — I83013 Varicose veins of right lower extremity with ulcer of ankle: Secondary | ICD-10-CM | POA: Diagnosis not present

## 2017-07-06 DIAGNOSIS — L97313 Non-pressure chronic ulcer of right ankle with necrosis of muscle: Secondary | ICD-10-CM | POA: Diagnosis not present

## 2017-07-06 NOTE — Telephone Encounter (Signed)
LM to return call.

## 2017-07-06 NOTE — Telephone Encounter (Signed)
Patient notified

## 2017-07-06 NOTE — Telephone Encounter (Signed)
BP today is 145/70. Patient states she is in a lot of pain and thinks this may be the cause of her BP being elevated. Also, patient states the swelling has gone down a lot. Patient taking lasix 60 mg daily. Patient going this week for her blood work.

## 2017-07-14 DIAGNOSIS — I10 Essential (primary) hypertension: Secondary | ICD-10-CM | POA: Diagnosis not present

## 2017-07-14 DIAGNOSIS — E119 Type 2 diabetes mellitus without complications: Secondary | ICD-10-CM | POA: Diagnosis not present

## 2017-07-15 DIAGNOSIS — I83013 Varicose veins of right lower extremity with ulcer of ankle: Secondary | ICD-10-CM | POA: Diagnosis not present

## 2017-07-15 DIAGNOSIS — L97313 Non-pressure chronic ulcer of right ankle with necrosis of muscle: Secondary | ICD-10-CM | POA: Diagnosis not present

## 2017-07-15 DIAGNOSIS — L97319 Non-pressure chronic ulcer of right ankle with unspecified severity: Secondary | ICD-10-CM | POA: Diagnosis not present

## 2017-07-15 DIAGNOSIS — T8130XA Disruption of wound, unspecified, initial encounter: Secondary | ICD-10-CM | POA: Diagnosis not present

## 2017-07-22 DIAGNOSIS — I872 Venous insufficiency (chronic) (peripheral): Secondary | ICD-10-CM | POA: Diagnosis not present

## 2017-07-22 DIAGNOSIS — T8131XA Disruption of external operation (surgical) wound, not elsewhere classified, initial encounter: Secondary | ICD-10-CM | POA: Diagnosis not present

## 2017-07-28 ENCOUNTER — Ambulatory Visit (INDEPENDENT_AMBULATORY_CARE_PROVIDER_SITE_OTHER): Payer: Medicare Other | Admitting: Orthopaedic Surgery

## 2017-07-28 DIAGNOSIS — Z6833 Body mass index (BMI) 33.0-33.9, adult: Secondary | ICD-10-CM | POA: Diagnosis not present

## 2017-07-28 DIAGNOSIS — R600 Localized edema: Secondary | ICD-10-CM | POA: Diagnosis not present

## 2017-07-28 DIAGNOSIS — Z789 Other specified health status: Secondary | ICD-10-CM | POA: Diagnosis not present

## 2017-07-28 DIAGNOSIS — E1165 Type 2 diabetes mellitus with hyperglycemia: Secondary | ICD-10-CM | POA: Diagnosis not present

## 2017-07-28 DIAGNOSIS — I10 Essential (primary) hypertension: Secondary | ICD-10-CM | POA: Diagnosis not present

## 2017-07-28 DIAGNOSIS — Z299 Encounter for prophylactic measures, unspecified: Secondary | ICD-10-CM | POA: Diagnosis not present

## 2017-07-28 IMAGING — RF DG ESOPHAGUS
9 series · 9 of 9 positions shown · non-contrast
Comparison: Chest x-ray 04/18/2015.

CLINICAL DATA: Difficulty swallowing pills and food.

EXAM:
ESOPHOGRAM/BARIUM SWALLOW
TECHNIQUE: Single contrast examination was performed using  thin barium.
FLUOROSCOPY TIME:  Fluoroscopy Time:  0 minutes 48 seconds
Radiation Exposure Index (if provided by the fluoroscopic device):
22.3 mGy
Number of Acquired Spot Images: 9

[Series 1: fluoro_barium 2fps_bw · 0.19mm/px · 1 of 1 slices shown (1 of 8)]
[im 1/1]
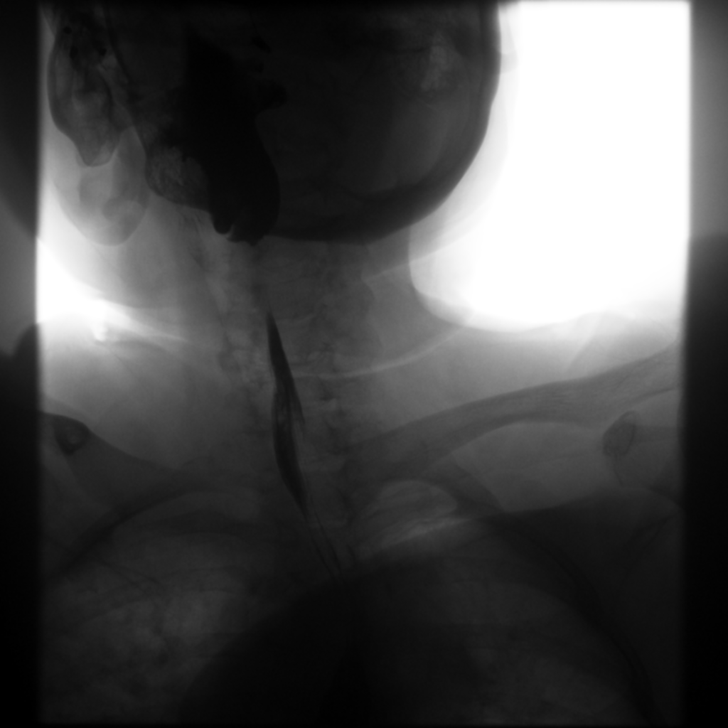

[Series 2: fluoro_barium 2fps_bw · 0.19mm/px · 1 of 1 slices shown (2 of 8)]
[im 1/1]
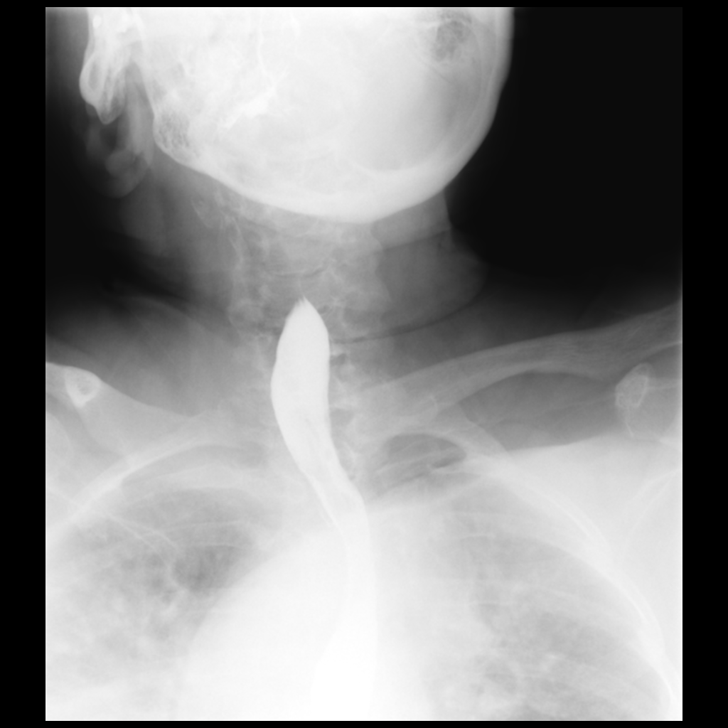

[Series 3: fluoro_barium 2fps_bw · 0.19mm/px · 1 of 1 slices shown (3 of 8)]
[im 1/1]
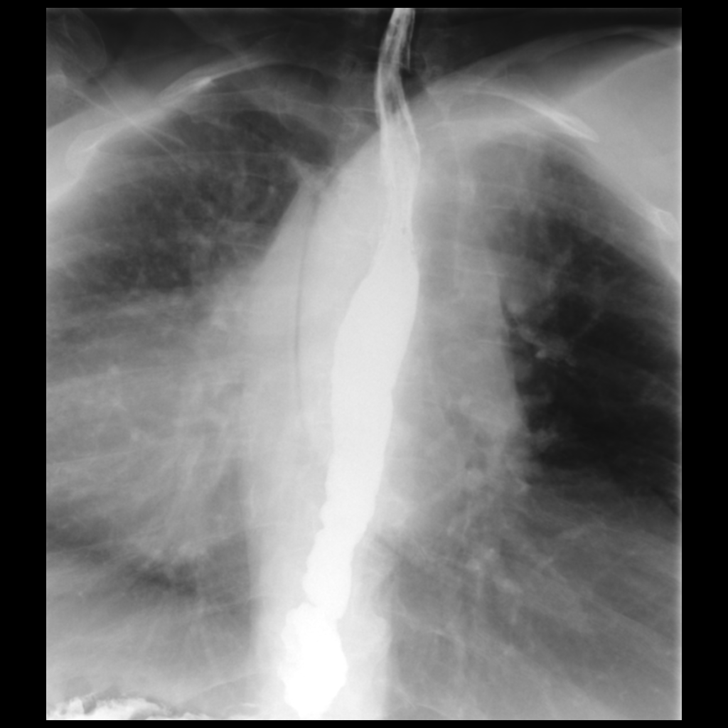

[Series 4: fluoro_barium 2fps_bw · 0.19mm/px · 1 of 1 slices shown (4 of 8)]
[im 1/1]
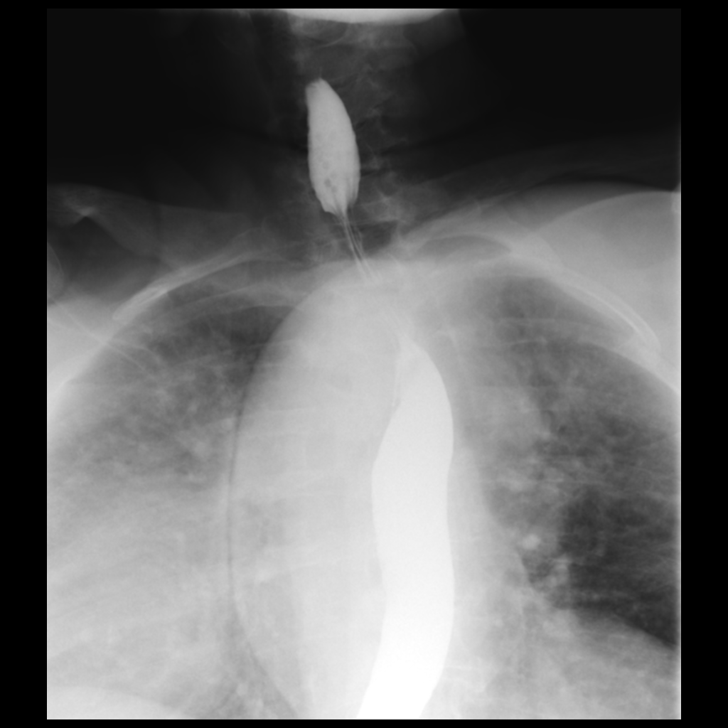

[Series 5: fluoro_barium 2fps_bw · 0.19mm/px · 1 of 1 slices shown (5 of 8)]
[im 1/1]
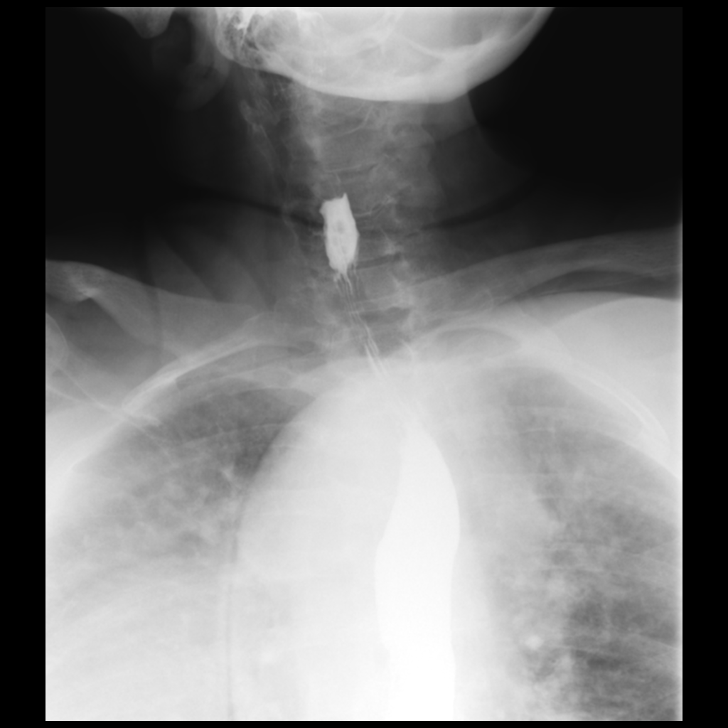

[Series 6: fluoro_barium 2fps_bw · 0.19mm/px · 1 of 1 slices shown (6 of 8)]
[im 1/1]
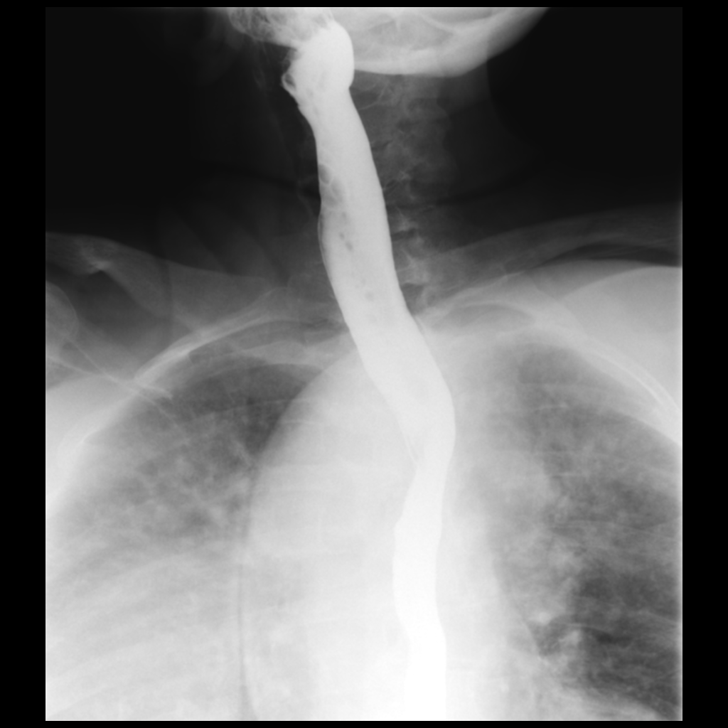

[Series 7: fluoro_barium 2fps_bw · 0.19mm/px · 1 of 1 slices shown (7 of 8)]
[im 1/1]
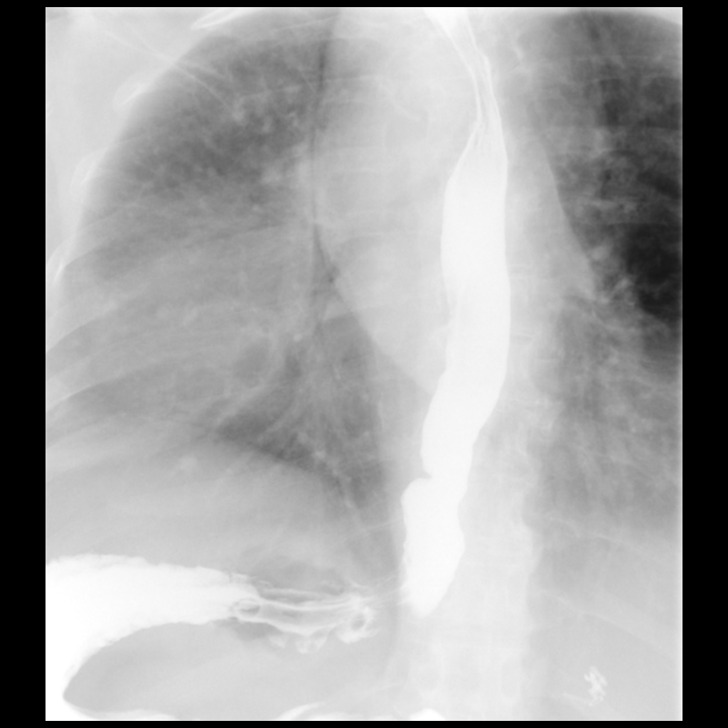

[Series 8: fluoro_barium 2fps_bw · 0.19mm/px · 1 of 1 slices shown (8 of 8)]
[im 1/1]
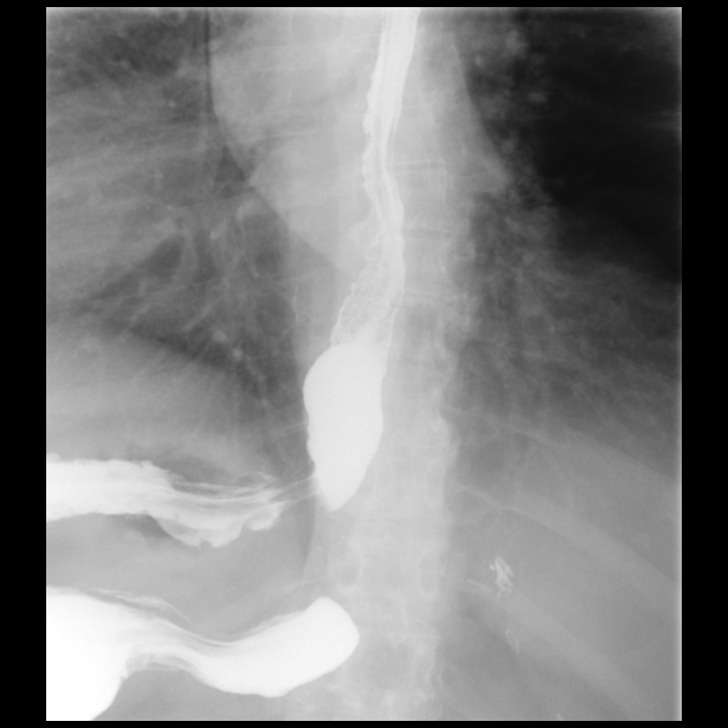

[Series 9: cp_standard · 0.17mm/px · 1 of 1 slices shown]
[im 1/1]
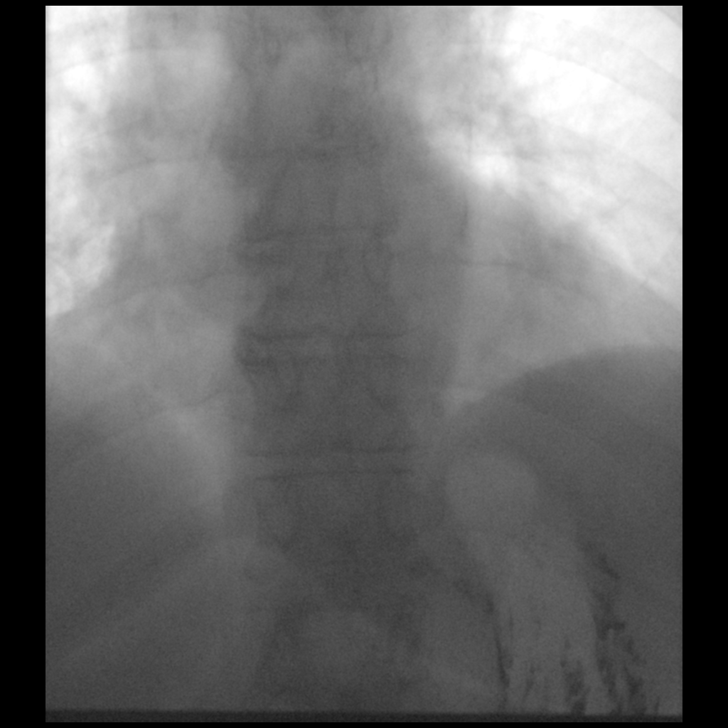

[9 of 9 positions shown; findings below may reference images not displayed]

FINDINGS: Limited exam due the patient's stage. Mild tertiary contractions.
The esophagus is widely patent. No focal obstructing abnormality
identified. Small hiatal hernia noted. No prominent reflux
identified. Standardized barium tablet passed normally.
IMPRESSION: 1. Mild tertiary contractions suggesting presbyesophagus. No
obstructing abnormality identified. Standardized barium tablet
passed normally.

2. Small hiatal hernia.  No prominent reflux noted.

## 2017-07-29 DIAGNOSIS — L97313 Non-pressure chronic ulcer of right ankle with necrosis of muscle: Secondary | ICD-10-CM | POA: Diagnosis not present

## 2017-07-29 DIAGNOSIS — I83013 Varicose veins of right lower extremity with ulcer of ankle: Secondary | ICD-10-CM | POA: Diagnosis not present

## 2017-08-04 ENCOUNTER — Encounter (INDEPENDENT_AMBULATORY_CARE_PROVIDER_SITE_OTHER): Payer: Self-pay | Admitting: Orthopaedic Surgery

## 2017-08-04 ENCOUNTER — Ambulatory Visit (INDEPENDENT_AMBULATORY_CARE_PROVIDER_SITE_OTHER): Payer: Medicare Other | Admitting: Orthopaedic Surgery

## 2017-08-04 ENCOUNTER — Telehealth: Payer: Self-pay | Admitting: *Deleted

## 2017-08-04 VITALS — BP 137/62 | HR 75

## 2017-08-04 DIAGNOSIS — I10 Essential (primary) hypertension: Secondary | ICD-10-CM | POA: Diagnosis not present

## 2017-08-04 DIAGNOSIS — E1142 Type 2 diabetes mellitus with diabetic polyneuropathy: Secondary | ICD-10-CM | POA: Diagnosis not present

## 2017-08-04 DIAGNOSIS — R1084 Generalized abdominal pain: Secondary | ICD-10-CM | POA: Diagnosis not present

## 2017-08-04 DIAGNOSIS — E1165 Type 2 diabetes mellitus with hyperglycemia: Secondary | ICD-10-CM | POA: Diagnosis not present

## 2017-08-04 DIAGNOSIS — M1712 Unilateral primary osteoarthritis, left knee: Secondary | ICD-10-CM

## 2017-08-04 DIAGNOSIS — M170 Bilateral primary osteoarthritis of knee: Secondary | ICD-10-CM

## 2017-08-04 DIAGNOSIS — N183 Chronic kidney disease, stage 3 (moderate): Secondary | ICD-10-CM | POA: Diagnosis not present

## 2017-08-04 DIAGNOSIS — R600 Localized edema: Secondary | ICD-10-CM | POA: Diagnosis not present

## 2017-08-04 DIAGNOSIS — Z6834 Body mass index (BMI) 34.0-34.9, adult: Secondary | ICD-10-CM | POA: Diagnosis not present

## 2017-08-04 DIAGNOSIS — M1711 Unilateral primary osteoarthritis, right knee: Secondary | ICD-10-CM | POA: Diagnosis not present

## 2017-08-04 DIAGNOSIS — E039 Hypothyroidism, unspecified: Secondary | ICD-10-CM | POA: Diagnosis not present

## 2017-08-04 DIAGNOSIS — M545 Low back pain: Secondary | ICD-10-CM | POA: Diagnosis not present

## 2017-08-04 DIAGNOSIS — Z299 Encounter for prophylactic measures, unspecified: Secondary | ICD-10-CM | POA: Diagnosis not present

## 2017-08-04 MED ORDER — LIDOCAINE HCL 1 % IJ SOLN
2.0000 mL | INTRAMUSCULAR | Status: AC | PRN
  Administered 2017-08-04: 2 mL

## 2017-08-04 MED ORDER — BUPIVACAINE HCL 0.5 % IJ SOLN
2.0000 mL | INTRAMUSCULAR | Status: AC | PRN
Start: 2017-08-04 — End: 2017-08-04
  Administered 2017-08-04: 2 mL via INTRA_ARTICULAR

## 2017-08-04 MED ORDER — BUPIVACAINE HCL 0.5 % IJ SOLN
2.0000 mL | INTRAMUSCULAR | Status: AC | PRN
  Administered 2017-08-04: 2 mL via INTRA_ARTICULAR

## 2017-08-04 MED ORDER — METHYLPREDNISOLONE ACETATE 40 MG/ML IJ SUSP
80.0000 mg | INTRAMUSCULAR | Status: AC | PRN
  Administered 2017-08-04: 80 mg

## 2017-08-04 NOTE — Telephone Encounter (Signed)
-----   Message from Arnoldo Lenis, MD sent at 08/03/2017 12:13 PM EST ----- Labs show potassium and magnesium are low,please see if she has heard from Dr Trena Platt office about this   Zandra Abts MD

## 2017-08-04 NOTE — Progress Notes (Signed)
Office Visit Note   Patient: Rhonda Mejia           Date of Birth: April 05, 1929           MRN: [id number] Visit Date: 08/04/2017              Requested by: Rhonda Blitz, MD [address] [place], [place] [address] PCP: Rhonda Blitz, MD   Assessment & Plan: Visit Diagnoses:  1. Bilateral primary osteoarthritis of knee     Plan: Bilateral knee osteoarthritis. Will inject with cortisone bilaterally Follow-Up Instructions: Return if symptoms worsen or fail to improve.   Orders:  No orders of the defined types were placed in this encounter.  No orders of the defined types were placed in this encounter.     Procedures: Large Joint Inj: L knee on 08/04/2017 2:17 PM Indications: pain and diagnostic evaluation Details: 25 G 1.5 in needle, anteromedial approach  Arthrogram: No  Medications: 2 mL bupivacaine 0.5 %; 2 mL lidocaine 1 %; 80 mg methylPREDNISolone acetate 40 MG/ML Procedure, treatment alternatives, risks and benefits explained, specific risks discussed. Consent was given by the patient. Patient was prepped and draped in the usual sterile fashion.   Large Joint Inj: R knee on 08/04/2017 2:17 PM Indications: pain and diagnostic evaluation Details: 25 G 1.5 in needle, anteromedial approach  Arthrogram: No  Medications: 2 mL lidocaine 1 %; 2 mL bupivacaine 0.5 %; 80 mg methylPREDNISolone acetate 40 MG/ML Procedure, treatment alternatives, risks and benefits explained, specific risks discussed. Consent was given by the patient. Immediately prior to procedure a time out was called to verify the correct patient, procedure, equipment, support staff and site/side marked as required. Patient was prepped and draped in the usual sterile fashion.       Clinical Data: No additional findings.   Subjective: Chief Complaint  Patient presents with  . Left Knee - Pain  . Right Knee - Pain  Patient presents with bilateral knee pain. She has a known history of osteoarthritis.  She  requests injections in both knees today if possible. Previous cortisone injection in left knee 05/05/17.  Previous cortisone injection in right knee on 01/06/2017.  Patient states her A1C was 5.6 last week.  She is still going to wound care for her right ankle. Rhonda Mejia is been seen on multiple occasions in the past for treatment of the arthritis in both knees. She has had prior cortisone injections with good relief  HPI  Review of Systems   Objective: Vital Signs: BP 137/62   Pulse 75   Physical Exam  Ortho Exam awake alert and oriented 3 comfortable sitting. May be small effusion of both knees. Knees are large. Predominantly medial joint pain. None laterally. Full extension and flexion over 100 without instability. No calf pain. He is being [facility] for nonhealing incision for an ankle fracture on the right  Specialty Comments:  No specialty comments available.  Imaging: No results found.   PMFS History: Patient Active Problem List   Diagnosis Date Noted  . Dysphagia, unspecified(787.20) 06/07/2013  . Unspecified hypothyroidism 10/10/2012  . High cholesterol 10/10/2012  . Essential hypertension, benign 10/10/2012  . Rectal bleeding 10/10/2012  . Pill dysphagia 10/10/2012  . Family hx of colon cancer 10/10/2012  . Anemia 10/10/2012   Past Medical History:  Diagnosis Date  . Cancer [facility])    left breast cancer  . Carotid artery occlusion   . Diabetes mellitus without complication ([place])    diet  controlled  . Family hx of colon cancer   . GERD (gastroesophageal reflux disease)   . High cholesterol   . History of gout   . Hypertension    for over 20 yrs  . Hypothyroid   . Neuropathy   . Pill dysphagia     Family History  Problem Relation Age of Onset  . Colon cancer Brother     Past Surgical History:  Procedure Laterality Date  . BACK SURGERY     5 rods and 5 screws and cage in back, has had 4 back surgeries.  Marland Kitchen BREAST LUMPECTOMY      left breast  . CATARACT EXTRACTION W/PHACO Left 12/31/2014   Procedure: CATARACT EXTRACTION PHACO AND INTRAOCULAR LENS PLACEMENT (IOC);  Surgeon: Tonny Branch, MD;  Location: [facility] ORS;  Service: Ophthalmology;  Laterality: Left;  CDE:8.45  . CATARACT EXTRACTION W/PHACO Right 01/28/2015   Procedure: CATARACT EXTRACTION PHACO AND INTRAOCULAR LENS PLACEMENT RIGHT EYE;  Surgeon: Tonny Branch, MD;  Location: [facility] ORS;  Service: Ophthalmology;  Laterality: Right;  CDE:4.92  . CHOLECYSTECTOMY    . COLONOSCOPY N/A 10/21/2012   Procedure: COLONOSCOPY;  Surgeon: Rogene Houston, MD;  Location: [facility] END[facility] SUITE;  Service: Endoscopy;  Laterality: N/A;  . ESOPHAGOGASTRODUODENOSCOPY (EGD) WITH ESOPHAGEAL DILATION N/A 06/09/2013   Procedure: ESOPHAGOGASTRODUODENOSCOPY (EGD) WITH ESOPHAGEAL DILATION;  Surgeon: Rogene Houston, MD;  Location: [facility];  Service: Endoscopy;  Laterality: N/A;  125  . KNEE SURGERY Right    arthroscopy x2  . SPINE SURGERY     3 surgeries by Dr. Jenne Campus,  Last surgery 2 years ago by Dr. Carloyn Manner in [place] History  . Not on file  Tobacco Use  . Smoking status: Never Smoker  . Smokeless tobacco: Never Used  Substance and Sexual Activity  . Alcohol use: No    Alcohol/week: 0.0 oz  . Drug use: No  . Sexual activity: Yes    Birth control/protection: Post-menopausal

## 2017-08-04 NOTE — Telephone Encounter (Signed)
Pt seen Dr Manuella Ghazi today - gave her 10 day supply of magnesium/eat 1 banana per day - also increased lasix to 40 mg bid (was taking 60 mg daily) pt c/o some SOB and continued swelling in feet/legs/stomach - denies any dizziness/CP - says Dr Manuella Ghazi ordered ABD [place] scheduled for Monday. Pt scheduled with Dr Harl Bowie in March, is this f/u ok?

## 2017-08-05 DIAGNOSIS — I872 Venous insufficiency (chronic) (peripheral): Secondary | ICD-10-CM | POA: Diagnosis not present

## 2017-08-05 DIAGNOSIS — L97313 Non-pressure chronic ulcer of right ankle with necrosis of muscle: Secondary | ICD-10-CM | POA: Diagnosis not present

## 2017-08-05 DIAGNOSIS — T8131XA Disruption of external operation (surgical) wound, not elsewhere classified, initial encounter: Secondary | ICD-10-CM | POA: Diagnosis not present

## 2017-08-05 DIAGNOSIS — I83013 Varicose veins of right lower extremity with ulcer of ankle: Secondary | ICD-10-CM | POA: Diagnosis not present

## 2017-08-05 NOTE — Telephone Encounter (Signed)
I agree with Dr Janyth Pupa plans at this time, he is managing this very well. We will keep our f/u in March, if he feels she needs to see [place] sooner then we can change appt   Zandra Abts MD

## 2017-08-06 NOTE — Telephone Encounter (Signed)
Will f/u with pt on Monday/Tuesday

## 2017-08-09 DIAGNOSIS — R5383 Other fatigue: Secondary | ICD-10-CM | POA: Diagnosis not present

## 2017-08-09 DIAGNOSIS — Z9049 Acquired absence of other specified parts of digestive tract: Secondary | ICD-10-CM | POA: Diagnosis not present

## 2017-08-09 DIAGNOSIS — R109 Unspecified abdominal pain: Secondary | ICD-10-CM | POA: Diagnosis not present

## 2017-08-10 NOTE — Telephone Encounter (Signed)
Pt awaiting results from Dr Manuella Ghazi for ABD [place] and says she will call back if she feels she wants to be seen prior to March appt with Dr Harl Bowie

## 2017-08-17 DIAGNOSIS — E119 Type 2 diabetes mellitus without complications: Secondary | ICD-10-CM | POA: Diagnosis not present

## 2017-08-17 DIAGNOSIS — I10 Essential (primary) hypertension: Secondary | ICD-10-CM | POA: Diagnosis not present

## 2017-08-18 DIAGNOSIS — M5124 Other intervertebral disc displacement, thoracic region: Secondary | ICD-10-CM | POA: Diagnosis not present

## 2017-08-18 DIAGNOSIS — R600 Localized edema: Secondary | ICD-10-CM | POA: Diagnosis not present

## 2017-08-18 DIAGNOSIS — M47816 Spondylosis without myelopathy or radiculopathy, lumbar region: Secondary | ICD-10-CM | POA: Diagnosis not present

## 2017-08-18 DIAGNOSIS — Z713 Dietary counseling and surveillance: Secondary | ICD-10-CM | POA: Diagnosis not present

## 2017-08-18 DIAGNOSIS — Z6832 Body mass index (BMI) 32.0-32.9, adult: Secondary | ICD-10-CM | POA: Diagnosis not present

## 2017-08-18 DIAGNOSIS — Z299 Encounter for prophylactic measures, unspecified: Secondary | ICD-10-CM | POA: Diagnosis not present

## 2017-08-18 DIAGNOSIS — M545 Low back pain: Secondary | ICD-10-CM | POA: Diagnosis not present

## 2017-08-24 DIAGNOSIS — K529 Noninfective gastroenteritis and colitis, unspecified: Secondary | ICD-10-CM | POA: Diagnosis not present

## 2017-08-24 DIAGNOSIS — I10 Essential (primary) hypertension: Secondary | ICD-10-CM | POA: Diagnosis not present

## 2017-08-24 DIAGNOSIS — N183 Chronic kidney disease, stage 3 (moderate): Secondary | ICD-10-CM | POA: Diagnosis not present

## 2017-08-24 DIAGNOSIS — E1122 Type 2 diabetes mellitus with diabetic chronic kidney disease: Secondary | ICD-10-CM | POA: Diagnosis not present

## 2017-08-24 DIAGNOSIS — Z299 Encounter for prophylactic measures, unspecified: Secondary | ICD-10-CM | POA: Diagnosis not present

## 2017-08-24 DIAGNOSIS — Z6830 Body mass index (BMI) 30.0-30.9, adult: Secondary | ICD-10-CM | POA: Diagnosis not present

## 2017-08-26 DIAGNOSIS — E1122 Type 2 diabetes mellitus with diabetic chronic kidney disease: Secondary | ICD-10-CM | POA: Diagnosis present

## 2017-08-26 DIAGNOSIS — Z888 Allergy status to other drugs, medicaments and biological substances status: Secondary | ICD-10-CM | POA: Diagnosis not present

## 2017-08-26 DIAGNOSIS — K219 Gastro-esophageal reflux disease without esophagitis: Secondary | ICD-10-CM | POA: Diagnosis present

## 2017-08-26 DIAGNOSIS — Z880 Allergy status to penicillin: Secondary | ICD-10-CM | POA: Diagnosis not present

## 2017-08-26 DIAGNOSIS — Z809 Family history of malignant neoplasm, unspecified: Secondary | ICD-10-CM | POA: Diagnosis not present

## 2017-08-26 DIAGNOSIS — R531 Weakness: Secondary | ICD-10-CM | POA: Diagnosis not present

## 2017-08-26 DIAGNOSIS — N189 Chronic kidney disease, unspecified: Secondary | ICD-10-CM | POA: Diagnosis present

## 2017-08-26 DIAGNOSIS — N179 Acute kidney failure, unspecified: Secondary | ICD-10-CM | POA: Diagnosis not present

## 2017-08-26 DIAGNOSIS — Z79899 Other long term (current) drug therapy: Secondary | ICD-10-CM | POA: Diagnosis not present

## 2017-08-26 DIAGNOSIS — R197 Diarrhea, unspecified: Secondary | ICD-10-CM | POA: Diagnosis not present

## 2017-08-26 DIAGNOSIS — R7989 Other specified abnormal findings of blood chemistry: Secondary | ICD-10-CM | POA: Diagnosis not present

## 2017-08-26 DIAGNOSIS — R55 Syncope and collapse: Secondary | ICD-10-CM | POA: Diagnosis not present

## 2017-08-26 DIAGNOSIS — M109 Gout, unspecified: Secondary | ICD-10-CM | POA: Diagnosis present

## 2017-08-26 DIAGNOSIS — M1990 Unspecified osteoarthritis, unspecified site: Secondary | ICD-10-CM | POA: Diagnosis present

## 2017-08-26 DIAGNOSIS — Z7982 Long term (current) use of aspirin: Secondary | ICD-10-CM | POA: Diagnosis not present

## 2017-08-26 DIAGNOSIS — I129 Hypertensive chronic kidney disease with stage 1 through stage 4 chronic kidney disease, or unspecified chronic kidney disease: Secondary | ICD-10-CM | POA: Diagnosis present

## 2017-08-26 DIAGNOSIS — Z800 Family history of malignant neoplasm of digestive organs: Secondary | ICD-10-CM | POA: Diagnosis not present

## 2017-08-26 DIAGNOSIS — Z886 Allergy status to analgesic agent status: Secondary | ICD-10-CM | POA: Diagnosis not present

## 2017-08-26 DIAGNOSIS — Z881 Allergy status to other antibiotic agents status: Secondary | ICD-10-CM | POA: Diagnosis not present

## 2017-08-26 DIAGNOSIS — Z8249 Family history of ischemic heart disease and other diseases of the circulatory system: Secondary | ICD-10-CM | POA: Diagnosis not present

## 2017-08-30 ENCOUNTER — Ambulatory Visit: Payer: Medicare Other | Admitting: Family

## 2017-08-30 ENCOUNTER — Encounter (HOSPITAL_COMMUNITY): Payer: Medicare Other

## 2017-09-03 DIAGNOSIS — E1122 Type 2 diabetes mellitus with diabetic chronic kidney disease: Secondary | ICD-10-CM | POA: Diagnosis not present

## 2017-09-03 DIAGNOSIS — N179 Acute kidney failure, unspecified: Secondary | ICD-10-CM | POA: Diagnosis not present

## 2017-09-03 DIAGNOSIS — Z299 Encounter for prophylactic measures, unspecified: Secondary | ICD-10-CM | POA: Diagnosis not present

## 2017-09-03 DIAGNOSIS — E860 Dehydration: Secondary | ICD-10-CM | POA: Diagnosis not present

## 2017-09-03 DIAGNOSIS — Z6832 Body mass index (BMI) 32.0-32.9, adult: Secondary | ICD-10-CM | POA: Diagnosis not present

## 2017-09-03 DIAGNOSIS — E041 Nontoxic single thyroid nodule: Secondary | ICD-10-CM | POA: Diagnosis not present

## 2017-09-03 DIAGNOSIS — N183 Chronic kidney disease, stage 3 (moderate): Secondary | ICD-10-CM | POA: Diagnosis not present

## 2017-09-03 DIAGNOSIS — E1142 Type 2 diabetes mellitus with diabetic polyneuropathy: Secondary | ICD-10-CM | POA: Diagnosis not present

## 2017-09-03 DIAGNOSIS — Z09 Encounter for follow-up examination after completed treatment for conditions other than malignant neoplasm: Secondary | ICD-10-CM | POA: Diagnosis not present

## 2017-09-03 DIAGNOSIS — E1165 Type 2 diabetes mellitus with hyperglycemia: Secondary | ICD-10-CM | POA: Diagnosis not present

## 2017-09-29 ENCOUNTER — Encounter: Payer: Self-pay | Admitting: Cardiology

## 2017-09-29 ENCOUNTER — Ambulatory Visit (INDEPENDENT_AMBULATORY_CARE_PROVIDER_SITE_OTHER): Payer: Medicare Other | Admitting: Cardiology

## 2017-09-29 VITALS — BP 147/78 | HR 62 | Ht 65.0 in | Wt 198.4 lb

## 2017-09-29 DIAGNOSIS — R600 Localized edema: Secondary | ICD-10-CM

## 2017-09-29 DIAGNOSIS — I10 Essential (primary) hypertension: Secondary | ICD-10-CM

## 2017-09-29 NOTE — Patient Instructions (Signed)
Your physician wants you to follow-up in: [place] will receive a reminder letter in the mail two months in advance. If you don't receive a letter, please call our office to schedule the follow-up appointment.  Your physician recommends that you continue on your current medications as directed. Please refer to the Current Medication list given to you today.  Your physician has requested that you regularly monitor and record your blood pressure readings at home FOR [employer]. Please use the same machine at the same time of day to check your readings and record them to bring to your follow-up visit.  Thank you for choosing [facility]!!

## 2017-09-29 NOTE — Progress Notes (Signed)
Clinical Summary Rhonda Mejia is a 84 y.o.female seen today for follow up of the following medical problems.  1. HTN - compliant with meds   2. LE edema - echo 02/2015 difficult study ,with LVEF 50-55%, normal RV function. Diastolic function not described.  - norvasc stopped at last visit due to LE edema. Lasix increased to 40mg  daily. Edema has improved  - repeat echo 09/2015 LVEF 60-65%, abnormal diastolic function grade indeterminate   - followed by vascular for chronic venous insufficiency - unable to we compression stockings due to ankle fracture  - last visit increased lasix to 60mg  daily. This was later increased to 40mg  bid by her pcp.  - now back on 60mg  daily of lasix. Mixed compliance.    3. Carotid stenosis - carotid US 09/2015 with concern for distal left CCA stenosis, 1-39% right ICA stenosis.  - 11/2015 CTA neck with 16% LICA stenosis, focal contained dissecion left ICA, no significant RICA disease - followed by vascular. From there consultation CTA results thought to be misleading due to heavy calficications and fairly mild velocities by carotid US.   4. Back pain - followed by Dr Carloyn Manner  5. DM2 -she isdiet controlled, followed by pcp  6. Right angle fracture - ongoing recovery.    Past Medical History:  Diagnosis Date  . Cancer [facility])    left breast cancer  . Carotid artery occlusion   . Diabetes mellitus without complication ([facility])    diet controlled  . Family hx of colon cancer   . GERD (gastroesophageal reflux disease)   . High cholesterol   . History of gout   . Hypertension    for over 20 yrs  . Hypothyroid   . Neuropathy   . Pill dysphagia      Allergies  Allergen Reactions  . Lipitor [Atorvastatin] Other (See Comments)    Didn't agree with stomach  . Penicillins     Rash   . Sulfa Antibiotics     Unknown reaction      Current Outpatient Medications  Medication Sig Dispense Refill  . allopurinol (ZYLOPRIM) 300 MG  tablet Take 300 mg by mouth daily.    . AMITIZA 24 MCG capsule Take 24 mcg by mouth daily.  1  . aspirin EC 81 MG tablet Take 81 mg by mouth daily.    . Cyanocobalamin (B-12) 2500 MCG TABS Take 2 tablets by mouth daily.     Marland Kitchen docusate sodium (COLACE) 100 MG capsule Take 100 mg by mouth daily as needed for mild constipation.    . folic acid (FOLVITE) 1 MG tablet Take 1 mg by mouth daily.    . furosemide (LASIX) 40 MG tablet Take 1.5 tablets (60 mg total) by mouth daily. 135 tablet 3  . gabapentin (NEURONTIN) 300 MG capsule Take 300 mg by mouth 2 (two) times daily.  2  . hydrALAZINE (APRESOLINE) 25 MG tablet Take 25 mg by mouth 2 (two) times daily.     Marland Kitchen levothyroxine (SYNTHROID, LEVOTHROID) 100 MCG tablet Take 100 mcg by mouth daily.    Marland Kitchen lisinopril (PRINIVIL,ZESTRIL) 40 MG tablet Take 40 mg by mouth daily.    . metoprolol succinate (TOPROL-XL) 50 MG 24 hr tablet Take 50 mg by mouth daily.  6  . Omega-3 Fatty Acids (FISH OIL PO) Take 1 capsule by mouth daily.    Marland Kitchen omeprazole (PRILOSEC) 40 MG capsule Take 40 mg by mouth daily.    Marland Kitchen pyridOXINE (VITAMIN B-6) 100 MG  tablet Take 100 mg by mouth daily.    Marland Kitchen senna (SENOKOT) 8.6 MG tablet Take 1 tablet by mouth as needed for constipation.    . traMADol (ULTRAM) 50 MG tablet Take 50 mg by mouth every 6 (six) hours as needed for pain.    Marland Kitchen venlafaxine (EFFEXOR) 75 MG tablet Take 75 mg by mouth daily.     . VOLTAREN 1 % GEL APPLY 2-4 GRAMS TO THE AFFECTED AREA TWICE DAILY  1   No current facility-administered medications for this visit.      Past Surgical History:  Procedure Laterality Date  . BACK SURGERY     5 rods and 5 screws and cage in back, has had 4 back surgeries.  Marland Kitchen BREAST LUMPECTOMY     left breast  . CATARACT EXTRACTION W/PHACO Left 12/31/2014   Procedure: CATARACT EXTRACTION PHACO AND INTRAOCULAR LENS PLACEMENT (IOC);  Surgeon: Tonny Camyla Camposano, MD;  Location: [facility];  Service: Ophthalmology;  Laterality: Left;  CDE:8.45  . CATARACT  EXTRACTION W/PHACO Right 01/28/2015   Procedure: CATARACT EXTRACTION PHACO AND INTRAOCULAR LENS PLACEMENT RIGHT EYE;  Surgeon: Tonny Sarahgrace Broman, MD;  Location: [facility];  Service: Ophthalmology;  Laterality: Right;  CDE:4.92  . CHOLECYSTECTOMY    . COLONOSCOPY N/A 10/21/2012   Procedure: COLONOSCOPY;  Surgeon: Rogene Houston, MD;  Location: [facility];  Service: Endoscopy;  Laterality: N/A;  . ESOPHAGOGASTRODUODENOSCOPY (EGD) WITH ESOPHAGEAL DILATION N/A 06/09/2013   Procedure: ESOPHAGOGASTRODUODENOSCOPY (EGD) WITH ESOPHAGEAL DILATION;  Surgeon: Rogene Houston, MD;  [facility];  Service: Endoscopy;  Laterality: N/A;  125  . KNEE SURGERY Right    arthroscopy x2  . SPINE SURGERY     3 surgeries by Dr. Jenne Campus,  Last surgery 2 years ago by Dr. Carloyn Manner in [place] Reactions  . Lipitor [Atorvastatin] Other (See Comments)    Didn't agree with stomach  . Penicillins     Rash   . Sulfa Antibiotics     Unknown reaction       Family History  Problem Relation Age of Onset  . Colon cancer Brother      Social History Rhonda Mejia reports that  has never smoked. she has never used smokeless tobacco. Rhonda Mejia reports that she does not drink alcohol.   Review of Systems CONSTITUTIONAL: No weight loss, fever, chills, weakness or fatigue.  HEENT: Eyes: No visual loss, blurred vision, double vision or yellow sclerae.No hearing loss, sneezing, congestion, runny nose or sore throat.  SKIN: No rash or itching.  CARDIOVASCULAR: per hpi RESPIRATORY: No shortness of breath, cough or sputum.  GASTROINTESTINAL: No anorexia, nausea, vomiting or diarrhea. No abdominal pain or blood.  GENITOURINARY: No burning on urination, no polyuria NEUROLOGICAL: No headache, dizziness, syncope, paralysis, ataxia, numbness or tingling in the extremities. No change in bowel or bladder control.  MUSCULOSKELETAL: No muscle, back pain, joint pain or stiffness.  LYMPHATICS: No  enlarged nodes. No history of splenectomy.  PSYCHIATRIC: No history of depression or anxiety.  ENDOCRINOLOGIC: No reports of sweating, cold or heat intolerance. No polyuria or polydipsia.  Marland Kitchen   Physical Examination Vitals:   09/29/17 1330  BP: (!) 147/78  Pulse: 62  SpO2: 90%   Vitals:   09/29/17 1330  Weight: 198 lb 6.4 oz (90 kg)  Height: 5\' 5"  (1.651 m)    Gen: resting comfortably, no acute distress HEENT: no scleral icterus, pupils equal round and reactive, no palptable cervical adenopathy,  CV: RRR, no m/r/g, no jvd Resp: Clear to auscultation bilaterally GI: abdomen is soft, non-tender, non-distended, normal bowel sounds, no hepatosplenomegaly MSK: extremities are warm, no edema.  Skin: warm, no rash Neuro:  no focal deficits Psych: appropriate affect   Diagnostic Studies 09/2015 echo Study Conclusions  - Left ventricle: The cavity size was normal. Wall thickness was  increased in a pattern of mild LVH. Systolic function was normal.  The estimated ejection fraction was in the range of 60% to 65%.  Diastolic function is abnormal, indeterminate grade. Wall motion  was normal; there were no regional wall motion abnormalities. - Aortic valve: Mildly calcified annulus. Trileaflet; mildly  thickened leaflets. Valve area (VTI): 1.52 cm^2. Valve area  (Vmax): 1.43 cm^2. - Mitral valve: Mildly calcified annulus. Normal thickness leaflets  . - Technically adequate study.   11/2015 CTA neck IMPRESSION: 1. 75% stenosis of the left internal carotid artery at the bifurcation secondary to prominent posterior lateral soft tissue neck calcified plaque. 2. Atherosclerotic calcifications at the right carotid bifurcation and origins the great vessels without other focal stenoses. 3. 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 4. Multilevel spondylosis of the cervical spine is most pronounced at C4-5 and  C5-6.     Assessment and Plan   1. HTN  - elevated in clinic, she will submit bp log in 2 weeks. She has had some component of white coat HTN in the past.   2. LE edema - continue current meds, overall controlled         F/u8months     Arnoldo Lenis, M.D.

## 2017-10-03 ENCOUNTER — Encounter: Payer: Self-pay | Admitting: Cardiology

## 2017-10-06 DIAGNOSIS — R1032 Left lower quadrant pain: Secondary | ICD-10-CM | POA: Diagnosis not present

## 2017-10-12 DIAGNOSIS — H353122 Nonexudative age-related macular degeneration, left eye, intermediate dry stage: Secondary | ICD-10-CM | POA: Diagnosis not present

## 2017-10-12 DIAGNOSIS — Z961 Presence of intraocular lens: Secondary | ICD-10-CM | POA: Diagnosis not present

## 2017-10-12 DIAGNOSIS — Z9842 Cataract extraction status, left eye: Secondary | ICD-10-CM | POA: Diagnosis not present

## 2017-10-12 DIAGNOSIS — H524 Presbyopia: Secondary | ICD-10-CM | POA: Diagnosis not present

## 2017-10-12 DIAGNOSIS — H04123 Dry eye syndrome of bilateral lacrimal glands: Secondary | ICD-10-CM | POA: Diagnosis not present

## 2017-10-12 DIAGNOSIS — H353112 Nonexudative age-related macular degeneration, right eye, intermediate dry stage: Secondary | ICD-10-CM | POA: Diagnosis not present

## 2017-10-12 DIAGNOSIS — H353132 Nonexudative age-related macular degeneration, bilateral, intermediate dry stage: Secondary | ICD-10-CM | POA: Diagnosis not present

## 2017-10-12 DIAGNOSIS — H35363 Drusen (degenerative) of macula, bilateral: Secondary | ICD-10-CM | POA: Diagnosis not present

## 2017-10-12 DIAGNOSIS — H26491 Other secondary cataract, right eye: Secondary | ICD-10-CM | POA: Diagnosis not present

## 2017-10-12 DIAGNOSIS — H16223 Keratoconjunctivitis sicca, not specified as Sjogren's, bilateral: Secondary | ICD-10-CM | POA: Diagnosis not present

## 2017-10-12 DIAGNOSIS — H35361 Drusen (degenerative) of macula, right eye: Secondary | ICD-10-CM | POA: Diagnosis not present

## 2017-10-12 DIAGNOSIS — H11823 Conjunctivochalasis, bilateral: Secondary | ICD-10-CM | POA: Diagnosis not present

## 2017-11-01 ENCOUNTER — Encounter: Payer: Self-pay | Admitting: Family

## 2017-11-01 ENCOUNTER — Ambulatory Visit (INDEPENDENT_AMBULATORY_CARE_PROVIDER_SITE_OTHER): Payer: Medicare Other | Admitting: Family

## 2017-11-01 ENCOUNTER — Ambulatory Visit (HOSPITAL_COMMUNITY)
Admission: RE | Admit: 2017-11-01 | Discharge: 2017-11-01 | Disposition: A | Discharge status: Home / Self Care | Payer: Medicare Other | Source: Ambulatory Visit | Attending: Vascular Surgery | Admitting: Vascular Surgery

## 2017-11-01 VITALS — BP 159/78 | HR 75 | Temp 97.5°F | Resp 18 | Ht 66.0 in | Wt 198.9 lb

## 2017-11-01 DIAGNOSIS — I872 Venous insufficiency (chronic) (peripheral): Secondary | ICD-10-CM

## 2017-11-01 DIAGNOSIS — I6523 Occlusion and stenosis of bilateral carotid arteries: Secondary | ICD-10-CM | POA: Diagnosis not present

## 2017-11-01 NOTE — Patient Instructions (Addendum)
To decrease swelling in your feet and legs: Elevate feet above slightly bent knees, feet above heart, overnight and 3-4 times per day for 20 minutes.  To measure for knee high compression hose: Measure the length of calf (from the crease of the knee to the bottom of the heel), largest circumference of calf, and ankle circumference first thing in the morning before your legs have a chance to swell.  Take these 3 measurements with you to obtain 20-30 mm mercury graduated knee high compression hose.  Put the stockings on in the morning, remove at bedtime.     Stroke Prevention Some health problems and behaviors may make it more likely for you to have a stroke. Below are ways to lessen your risk of having a stroke.  Be active for at least 30 minutes on most or all days.  Do not smoke. Try not to be around others who smoke.  Do not drink too much alcohol. ? Do not have more than 2 drinks a day if you are a man. ? Do not have more than 1 drink a day if you are a woman and are not pregnant.  Eat healthy foods, such as fruits and vegetables. If you were put on a specific diet, follow the diet as told.  Keep your cholesterol levels under control through diet and medicines. Look for foods that are low in saturated fat, trans fat, cholesterol, and are high in fiber.  If you have diabetes, follow all diet plans and take your medicine as told.  Ask your doctor if you need treatment to lower your blood pressure. If you have high blood pressure (hypertension), follow all diet plans and take your medicine as told by your doctor.  If you are 18-39 years old, have your blood pressure checked every 3-5 years. If you are age 40 or older, have your blood pressure checked every year.  Keep a healthy weight. Eat foods that are low in calories, salt, saturated fat, trans fat, and cholesterol.  Do not take drugs.  Avoid birth control pills, if this applies. Talk to your doctor about the risks of taking  birth control pills.  Talk to your doctor if you have sleep problems (sleep apnea).  Take all medicine as told by your doctor. ? You may be told to take aspirin or blood thinner medicine. Take this medicine as told by your doctor. ? Understand your medicine instructions.  Make sure any other conditions you have are being taken care of.  Get help right away if:  You suddenly lose feeling (you feel numb) or have weakness in your face, arm, or leg.  Your face or eyelid hangs down to one side.  You suddenly feel confused.  You have trouble talking (aphasia) or understanding what people are saying.  You suddenly have trouble seeing in one or both eyes.  You suddenly have trouble walking.  You are dizzy.  You lose your balance or your movements are clumsy (uncoordinated).  You suddenly have a very bad headache and you do not know the cause.  You have new chest pain.  Your heart feels like it is fluttering or skipping a beat (irregular heartbeat). Do not wait to see if the symptoms above go away. Get help right away. Call your local emergency services (911 in [place] Do not drive yourself to the hospital. This information is not intended to replace advice given to you by your health care provider. Make sure you discuss any questions you have   questions you have with your health care provider. Document Released: 01/05/2012 Document Revised: 12/12/2015 Document Reviewed: 01/06/2013 Elsevier Interactive Patient Education  2018 [employer].     Chronic Venous Insufficiency Chronic venous insufficiency, also called venous stasis, is a condition that prevents blood from being pumped effectively through the veins in your legs. Blood may no longer be pumped effectively from the legs back to the heart. This condition can range from mild to severe. With proper treatment, you should be able to continue with an active life. What are the causes? Chronic venous insufficiency occurs when the vein walls  become stretched, weakened, or damaged, or when valves within the vein are damaged. Some common causes of this include:  High blood pressure inside the veins (venous hypertension).  Increased blood pressure in the leg veins from long periods of sitting or standing.  A blood clot that blocks blood flow in a vein (deep vein thrombosis, DVT).  Inflammation of a vein (phlebitis) that causes a blood clot to form.  Tumors in the pelvis that cause blood to back up.  What increases the risk? The following factors may make you more likely to develop this condition:  Having a family history of this condition.  Obesity.  Pregnancy.  Living without enough physical activity or exercise (sedentary lifestyle).  Smoking.  Having a job that requires long periods of standing or sitting in one place.  Being a certain age. Women in their 41s and 52s and men in their 37s are more likely to develop this condition.  What are the signs or symptoms? Symptoms of this condition include:  Veins that are enlarged, bulging, or twisted (varicose veins).  Skin breakdown or ulcers.  Reddened or discolored skin on the front of the leg.  Brown, smooth, tight, and painful skin just above the ankle, usually on the inside of the leg (lipodermatosclerosis).  Swelling.  How is this diagnosed? This condition may be diagnosed based on:  Your medical history.  A physical exam.  Tests, such as: ? A procedure that creates an image of a blood vessel and nearby organs and provides information about blood flow through the blood vessel (duplex ultrasound). ? A procedure that tests blood flow (plethysmography). ? A procedure to look at the veins using X-ray and dye (venogram).  How is this treated? The goals of treatment are to help you return to an active life and to minimize pain or disability. Treatment depends on the severity of your condition, and it may include:  Wearing compression stockings. These can  help relieve symptoms and help prevent your condition from getting worse. However, they do not cure the condition.  Sclerotherapy. This is a procedure involving an injection of a material that "dissolves" damaged veins.  Surgery. This may involve: ? Removing a diseased vein (vein stripping). ? Cutting off blood flow through the vein (laser ablation surgery). ? Repairing a valve.  Follow these instructions at home:  Wear compression stockings as told by your health care provider. These stockings help to prevent blood clots and reduce swelling in your legs.  Take over-the-counter and prescription medicines only as told by your health care provider.  Stay active by exercising, walking, or doing different activities. Ask your health care provider what activities are safe for you and how much exercise you need.  Drink enough fluid to keep your urine clear or pale yellow.  Do not use any products that contain nicotine or tobacco, such as cigarettes and e-cigarettes. If you need  help quitting, ask your health care provider.  Keep all follow-up visits as told by your health care provider. This is important. Contact a health care provider if:  You have redness, swelling, or more pain in the affected area.  You see a red streak or line that extends up or down from the affected area.  You have skin breakdown or a loss of skin in the affected area, even if the breakdown is small.  You get an injury in the affected area. Get help right away if:  You get an injury and an open wound in the affected area.  You have severe pain that does not get better with medicine.  You have sudden numbness or weakness in the foot or ankle below the affected area, or you have trouble moving your foot or ankle.  You have a fever and you have worse or persistent symptoms.  You have chest pain.  You have shortness of breath. Summary  Chronic venous insufficiency, also called venous stasis, is a condition  that prevents blood from being pumped effectively through the veins in your legs.  Chronic venous insufficiency occurs when the vein walls become stretched, weakened, or damaged, or when valves within the vein are damaged.  Treatment for this condition depends on how severe your condition is, and it may involve wearing compression stockings or having a procedure.  Make sure you stay active by exercising, walking, or doing different activities. Ask your health care provider what activities are safe for you and how much exercise you need. This information is not intended to replace advice given to you by your health care provider. Make sure you discuss any questions you have with your health care provider. Document Released: 11/09/2006 Document Revised: 05/25/2016 Document Reviewed: 05/25/2016 Elsevier Interactive Patient Education  2017 [employer].

## 2017-11-01 NOTE — Progress Notes (Signed)
Chief Complaint: Follow up Extracranial Carotid Artery Stenosis   History of Present Illness  Rhonda Mejia is a 84 y.o. female who Dr. Scot Dock saw in consultation in the hospital in May of 2017. A CT scan showed possibly 75% stenosis of the left internal carotid artery. However, the patient had calcific disease and Dr. Scot Dock thought this was not accurate. Duplex scan did not show any significant carotid disease.  Dr. Scot Dock last evaluated pt on 06-10-16. At that time carotid duplex showed ess than 39% internal carotid artery stenosis bilaterally. She had a greater than 50% left external carotid artery stenosis and elevated velocities in the distal left common carotid artery. She was asymptomatic. Pt was to follow up in a year with carotid duplex and see our nurse practitioner.  This patient does have chronic venous insufficiency. Dr. Scot Dock had discussed the importance of intermittent leg elevation in the proper positioning for this. She is also being treated with Lasix currently by her primary care physician. Pt states she has been treated at the wound care center in the past for right calf ulcers (chronic venous stasis ulcers).   She has a hx of lumbar spine issues, several lumbar spine surgeries, has neuropathy in her lower legs and feet, more numb than painful.   She is on aspirin. She does not want to take a statin.  She has never used tobacco. She states her DM is in good control.    Past Medical History:  Diagnosis Date  . Cancer [facility])    left breast cancer  . Carotid artery occlusion   . Diabetes mellitus without complication (HCC)    diet controlled  . Family hx of colon cancer   . GERD (gastroesophageal reflux disease)   . High cholesterol   . History of gout   . Hypertension    for over 20 yrs  . Hypothyroid   . Neuropathy   . Pill dysphagia     Social History Social History   Tobacco Use  . Smoking status: Never Smoker  . Smokeless tobacco: Never  Used  Substance Use Topics  . Alcohol use: No    Alcohol/week: 0.0 oz  . Drug use: No    Family History Family History  Problem Relation Age of Onset  . Colon cancer Brother     Surgical History Past Surgical History:  Procedure Laterality Date  . BACK SURGERY     5 rods and 5 screws and cage in back, has had 4 back surgeries.  Marland Kitchen BREAST LUMPECTOMY     left breast  . CATARACT EXTRACTION W/PHACO Left 12/31/2014   Procedure: CATARACT EXTRACTION PHACO AND INTRAOCULAR LENS PLACEMENT (IOC);  Surgeon: Tonny Branch, MD;  Location: [facility];  Service: Ophthalmology;  Laterality: Left;  CDE:8.45  . CATARACT EXTRACTION W/PHACO Right 01/28/2015   Procedure: CATARACT EXTRACTION PHACO AND INTRAOCULAR LENS PLACEMENT RIGHT EYE;  Surgeon: Tonny Branch, MD;  Location: [facility];  Service: Ophthalmology;  Laterality: Right;  CDE:4.92  . CHOLECYSTECTOMY    . COLONOSCOPY N/A 10/21/2012   Procedure: COLONOSCOPY;  Surgeon: Rogene Houston, MD;  Location: [facility];  Service: Endoscopy;  Laterality: N/A;  . ESOPHAGOGASTRODUODENOSCOPY (EGD) WITH ESOPHAGEAL DILATION N/A 06/09/2013   Procedure: ESOPHAGOGASTRODUODENOSCOPY (EGD) WITH ESOPHAGEAL DILATION;  Surgeon: Rogene Houston, MD;  Location: [facility];  Service: Endoscopy;  Laterality: N/A;  125  . KNEE SURGERY Right    arthroscopy x2  . SPINE SURGERY     3 surgeries by  Dr. Jenne Campus,  Last surgery 2 years ago by Dr. Carloyn Manner in [place] Reactions  . Lipitor [Atorvastatin] Other (See Comments)    Didn't agree with stomach  . Penicillins     Rash   . Sulfa Antibiotics     Unknown reaction     Current Outpatient Medications  Medication Sig Dispense Refill  . allopurinol (ZYLOPRIM) 300 MG tablet Take 300 mg by mouth daily.    . AMITIZA 24 MCG capsule Take 24 mcg by mouth daily as needed.   1  . aspirin EC 81 MG tablet Take 81 mg by mouth daily.    . Cyanocobalamin (B-12) 2500 MCG TABS Take 2 tablets by mouth daily.     . folic  acid (FOLVITE) 1 MG tablet Take 1 mg by mouth daily.    . furosemide (LASIX) 40 MG tablet Take 1.5 tablets (60 mg total) by mouth daily. 135 tablet 3  . gabapentin (NEURONTIN) 300 MG capsule Take 300 mg by mouth 2 (two) times daily.  2  . hydrALAZINE (APRESOLINE) 25 MG tablet Take 25 mg by mouth 2 (two) times daily.     Marland Kitchen levothyroxine (SYNTHROID, LEVOTHROID) 100 MCG tablet Take 100 mcg by mouth daily.    Marland Kitchen lisinopril (PRINIVIL,ZESTRIL) 40 MG tablet Take 40 mg by mouth daily.    . metoprolol succinate (TOPROL-XL) 50 MG 24 hr tablet Take 50 mg by mouth daily.  6  . Omega-3 Fatty Acids (FISH OIL PO) Take 1 capsule by mouth daily.    Marland Kitchen omeprazole (PRILOSEC) 40 MG capsule Take 40 mg by mouth daily.    Marland Kitchen pyridOXINE (VITAMIN B-6) 100 MG tablet Take 100 mg by mouth daily.    Marland Kitchen senna (SENOKOT) 8.6 MG tablet Take 1 tablet by mouth as needed for constipation.    . traMADol (ULTRAM) 50 MG tablet Take 50 mg by mouth every 6 (six) hours as needed for pain.    Marland Kitchen venlafaxine (EFFEXOR) 75 MG tablet Take 75 mg by mouth daily.     . VOLTAREN 1 % GEL APPLY 2-4 GRAMS TO THE AFFECTED AREA TWICE DAILY  1   No current facility-administered medications for this visit.     Review of Systems : See HPI for pertinent positives and negatives.  Physical Examination  Vitals:   11/01/17 1312  BP: (!) 159/78  Pulse: 75  Resp: 18  Temp: (!) 97.5 F (36.4 C)  TempSrc: Oral  SpO2: 96%  Weight: 198 lb 14.4 oz (90.2 kg)  Height: 5\' 6"  (1.676 m)   Body mass index is 32.1 kg/m.  General: WDWN obese female in NAD GAIT: slow, steady Eyes: PERRLA HENT: No gross abnormalities.  Pulmonary:  Respirations are non-labored, good air movement in all fields, CTAB, no rales,  rhonchi, or wheezing. Cardiac: regular rhythm, no detected murmur.  VASCULAR EXAM Carotid Bruits Right Left   Positive Positive     Abdominal aortic pulse is not palpable. Radial pulses are 2+ palpable and equal.  LE Pulses Right Left       POPLITEAL  not palpable   not palpable       POSTERIOR TIBIAL  not palpable   not palpable        DORSALIS PEDIS      ANTERIOR TIBIAL 1+ palpable  1+ palpable     Gastrointestinal: soft, nontender, BS WNL, no r/g, no palpable masses. Musculoskeletal: No muscle atrophy/wasting. M/S 5/5 throughout, extremities without ischemic changes. Skin: No rashes, no ulcers, no cellulitis.  1-2+ pitting and non pitting edema in lower legs and feet, wearing knee high compression hose. Healing venous stasis ulcer lateral aspect right calf.  Neurologic:  A&O X 3; appropriate affect, sensation is normal; speech is normal, CN 2-12 intact, pain and light touch intact in extremities, motor exam as listed above. Psychiatric: Normal thought content, mood appropriate to clinical situation.    Assessment: Rhonda Mejia is a 84 y.o. female who has no history of stroke or TIA. Bilateral carotid bruits are present.   Her atherosclerotic risk factors include in control DM, not wanting to take a statin, and obesity. Fortunately she has never used tobacco. She takes a daily 81 mg ASA.  DATA Carotid Duplex (11/01/17): Right ICA: 1-39% stenosis Left ICA: 1-39% stenosis Left CCA: >50% stenosis Left ECA: >50% stenosis Distal left CCA: >50% stenosis Bilateral vertebral artery flow is antegrade.  Bilateral subclavian artery waveforms are normal.  No significant change compared to the exam on 06-10-16.    Plan: Follow-up in 9 months with Carotid Duplex scan.   I discussed in depth with the patient the nature of atherosclerosis, and emphasized the importance of maximal medical management including strict control of blood pressure, blood glucose, and lipid levels, obtaining regular exercise, and continued cessation of smoking.  The patient is aware that without maximal medical management the  underlying atherosclerotic disease process will progress, limiting the benefit of any interventions. The patient was given information about stroke prevention and what symptoms should prompt the patient to seek immediate medical care. Thank you for allowing [place] to participate in this patient's care.  Clemon Chambers, RN, MSN, FNP-C [facility] Office: [facility] Physician: Trula Slade  11/01/17 1:23 PM

## 2017-11-03 ENCOUNTER — Encounter (INDEPENDENT_AMBULATORY_CARE_PROVIDER_SITE_OTHER): Payer: Self-pay | Admitting: Orthopaedic Surgery

## 2017-11-03 ENCOUNTER — Ambulatory Visit (INDEPENDENT_AMBULATORY_CARE_PROVIDER_SITE_OTHER): Payer: Medicare Other | Admitting: Orthopaedic Surgery

## 2017-11-03 VITALS — BP 155/78 | HR 78

## 2017-11-03 DIAGNOSIS — I6523 Occlusion and stenosis of bilateral carotid arteries: Secondary | ICD-10-CM | POA: Diagnosis not present

## 2017-11-03 DIAGNOSIS — M1711 Unilateral primary osteoarthritis, right knee: Secondary | ICD-10-CM

## 2017-11-03 DIAGNOSIS — M170 Bilateral primary osteoarthritis of knee: Secondary | ICD-10-CM | POA: Insufficient documentation

## 2017-11-03 DIAGNOSIS — M1712 Unilateral primary osteoarthritis, left knee: Secondary | ICD-10-CM

## 2017-11-03 MED ORDER — LIDOCAINE HCL 1 % IJ SOLN
2.0000 mL | INTRAMUSCULAR | Status: AC | PRN
  Administered 2017-11-03: 2 mL

## 2017-11-03 MED ORDER — METHYLPREDNISOLONE ACETATE 40 MG/ML IJ SUSP
80.0000 mg | INTRAMUSCULAR | Status: AC | PRN
  Administered 2017-11-03: 80 mg

## 2017-11-03 MED ORDER — BUPIVACAINE HCL 0.5 % IJ SOLN
2.0000 mL | INTRAMUSCULAR | Status: AC | PRN
  Administered 2017-11-03: 2 mL via INTRA_ARTICULAR

## 2017-11-03 NOTE — Progress Notes (Signed)
Office Visit Note   Patient: Rhonda Mejia           Date of Birth: 11/07/1928           MRN: [id number] Visit Date: 11/03/2017              Requested by: Monico Blitz, MD [address] [place], [place] [address] PCP: Monico Blitz, MD   Assessment & Plan: Visit Diagnoses:  1. Bilateral primary osteoarthritis of knee     Plan: Cortisone injection left knee.  Return in 2 weeks to inject right knee  Follow-Up Instructions: Return in about 2 weeks (around 11/17/2017).   Orders:  No orders of the defined types were placed in this encounter.  No orders of the defined types were placed in this encounter.     Procedures: Large Joint Inj: L knee on 11/03/2017 11:45 AM Indications: pain and diagnostic evaluation Details: 25 G 1.5 in needle, anteromedial approach  Arthrogram: No  Medications: 2 mL lidocaine 1 %; 2 mL bupivacaine 0.5 %; 80 mg methylPREDNISolone acetate 40 MG/ML Procedure, treatment alternatives, risks and benefits explained, specific risks discussed. Consent was given by the patient. Patient was prepped and draped in the usual sterile fashion.       Clinical Data: No additional findings.   Subjective: Chief Complaint  Patient presents with  . Left Knee - Pain  . Right Knee - Pain  Patient presents today with bilateral knee pain and requests injections. She states her last ones were done in February. She does currently have swelling in her right leg and an ulcer at her ankle that is being treated by Dr. Nils Pyle at the [facility]. She is awaiting another appointment.  Has been previously diagnosed with bilateral knee osteoarthritis by plain films.  She has been receiving cortisone injections.  Have had prior discussion regarding knee replacement but I think her comorbid abilities would preclude surgery.  She is having difficulty with wound healing from the ORIF of her ankle fracture by Dr. Case last summer and being followed in the wound center HPI  Review of  Systems   Objective: Vital Signs: BP (!) 155/78   Pulse 78   Physical Exam  Ortho Exam left knee with full extension flexion about 100 degrees.  No instability.  Predominately medial joint pain today.  May be small effusion.  Some patellar crepitation.  Skin intact  Specialty Comments:  No specialty comments available.  Imaging: No results found.   PMFS History: Patient Active Problem List   Diagnosis Date Noted  . Bilateral primary osteoarthritis of knee 11/03/2017  . Dysphagia, unspecified(787.20) 06/07/2013  . Unspecified hypothyroidism 10/10/2012  . High cholesterol 10/10/2012  . Essential hypertension, benign 10/10/2012  . Rectal bleeding 10/10/2012  . Pill dysphagia 10/10/2012  . Family hx of colon cancer 10/10/2012  . Anemia 10/10/2012   Past Medical History:  Diagnosis Date  . Cancer [facility])    left breast cancer  . Carotid artery occlusion   . Diabetes mellitus without complication (HCC)    diet controlled  . Family hx of colon cancer   . GERD (gastroesophageal reflux disease)   . High cholesterol   . History of gout   . Hypertension    for over 20 yrs  . Hypothyroid   . Neuropathy   . Pill dysphagia     Family History  Problem Relation Age of Onset  . Colon cancer Brother     Past Surgical History:  Procedure Laterality Date  .  BACK SURGERY     5 rods and 5 screws and cage in back, has had 4 back surgeries.  Marland Kitchen BREAST LUMPECTOMY     left breast  . CATARACT EXTRACTION W/PHACO Left 12/31/2014   Procedure: CATARACT EXTRACTION PHACO AND INTRAOCULAR LENS PLACEMENT (IOC);  Surgeon: Tonny Branch, MD;  Location: [facility];  Service: Ophthalmology;  Laterality: Left;  CDE:8.45  . CATARACT EXTRACTION W/PHACO Right 01/28/2015   Procedure: CATARACT EXTRACTION PHACO AND INTRAOCULAR LENS PLACEMENT RIGHT EYE;  Surgeon: Tonny Branch, MD;  Location: [facility];  Service: Ophthalmology;  Laterality: Right;  CDE:4.92  . CHOLECYSTECTOMY    . COLONOSCOPY N/A 10/21/2012   Procedure:  COLONOSCOPY;  Surgeon: Rogene Houston, MD;  [facility];  Service: Endoscopy;  Laterality: N/A;  . ESOPHAGOGASTRODUODENOSCOPY (EGD) WITH ESOPHAGEAL DILATION N/A 06/09/2013   Procedure: ESOPHAGOGASTRODUODENOSCOPY (EGD) WITH ESOPHAGEAL DILATION;  Surgeon: Rogene Houston, MD;  [facility];  Service: Endoscopy;  Laterality: N/A;  125  . KNEE SURGERY Right    arthroscopy x2  . SPINE SURGERY     3 surgeries by Dr. Jenne Campus,  Last surgery 2 years ago by Dr. Carloyn Manner in [place] History  . Not on file  Tobacco Use  . Smoking status: Never Smoker  . Smokeless tobacco: Never Used  Substance and Sexual Activity  . Alcohol use: No    Alcohol/week: 0.0 oz  . Drug use: No  . Sexual activity: Yes    Birth control/protection: Post-menopausal

## 2017-11-04 DIAGNOSIS — S82841D Displaced bimalleolar fracture of right lower leg, subsequent encounter for closed fracture with routine healing: Secondary | ICD-10-CM | POA: Diagnosis not present

## 2017-11-04 DIAGNOSIS — T148XXD Other injury of unspecified body region, subsequent encounter: Secondary | ICD-10-CM | POA: Diagnosis not present

## 2017-11-17 ENCOUNTER — Encounter (INDEPENDENT_AMBULATORY_CARE_PROVIDER_SITE_OTHER): Payer: Self-pay | Admitting: Orthopaedic Surgery

## 2017-11-17 ENCOUNTER — Ambulatory Visit (INDEPENDENT_AMBULATORY_CARE_PROVIDER_SITE_OTHER): Payer: Medicare Other | Admitting: Orthopaedic Surgery

## 2017-11-17 VITALS — BP 161/82 | HR 73 | Ht 65.0 in | Wt 190.0 lb

## 2017-11-17 DIAGNOSIS — M170 Bilateral primary osteoarthritis of knee: Secondary | ICD-10-CM

## 2017-11-17 MED ORDER — METHYLPREDNISOLONE ACETATE 40 MG/ML IJ SUSP
80.0000 mg | INTRAMUSCULAR | Status: AC | PRN
  Administered 2017-11-17: 80 mg

## 2017-11-17 MED ORDER — LIDOCAINE HCL 1 % IJ SOLN
2.0000 mL | INTRAMUSCULAR | Status: AC | PRN
  Administered 2017-11-17: 2 mL

## 2017-11-17 MED ORDER — BUPIVACAINE HCL 0.5 % IJ SOLN
2.0000 mL | INTRAMUSCULAR | Status: AC | PRN
  Administered 2017-11-17: 2 mL via INTRA_ARTICULAR

## 2017-11-17 NOTE — Progress Notes (Signed)
Office Visit Note   Patient: Rhonda Mejia           Date of Birth: 1928-10-07           MRN: [id number] Visit Date: 11/17/2017              Requested by: Monico Blitz, MD [address] [place], [place] [address] PCP: Monico Blitz, MD   Assessment & Plan: Visit Diagnoses:  1. Bilateral primary osteoarthritis of knee     Plan: Cortisone injection right knee.  Successful cortisone injection left knee 2 weeks ago.  We will see back as needed  Follow-Up Instructions: Return if symptoms worsen or fail to improve.   Orders:  Orders Placed This Encounter  Procedures  . Large Joint Inj: R knee   No orders of the defined types were placed in this encounter.     Procedures: Large Joint Inj: R knee on 11/17/2017 1:02 PM Indications: pain and diagnostic evaluation Details: 25 G 1.5 in needle, anteromedial approach  Arthrogram: No  Medications: 2 mL lidocaine 1 %; 2 mL bupivacaine 0.5 %; 80 mg methylPREDNISolone acetate 40 MG/ML Procedure, treatment alternatives, risks and benefits explained, specific risks discussed. Consent was given by the patient. Immediately prior to procedure a time out was called to verify the correct patient, procedure, equipment, support staff and site/side marked as required. Patient was prepped and draped in the usual sterile fashion.       Clinical Data: No additional findings.   Subjective: Chief Complaint  Patient presents with  . Right Knee - Pain  . Follow-up    would like r knee injection  Rhonda Mejia has an established diagnosis of advanced osteoarthritis of both knees.  She has had cortisone injection in the past.  Several weeks ago I injected her left knee and she relates she had good relief.  I did have a cortisone injection in her right knee today  HPI  Review of Systems  Constitutional: Negative for fatigue and fever.  HENT: Negative for ear pain.   Eyes: Negative for pain.  Respiratory: Negative for cough and shortness of breath.    Cardiovascular: Positive for leg swelling.  Gastrointestinal: Negative for constipation and diarrhea.  Genitourinary: Negative for difficulty urinating.  Musculoskeletal: Positive for back pain and neck pain.  Skin: Negative for rash.  Neurological: Positive for weakness and numbness.  Hematological: Bruises/bleeds easily.  Psychiatric/Behavioral: Negative for sleep disturbance.     Objective: Vital Signs: There were no vitals taken for this visit.  Physical Exam  Constitutional: She is oriented to person, place, and time. She appears well-developed and well-nourished.  HENT:  Mouth/Throat: Oropharynx is clear and moist.  Eyes: Pupils are equal, round, and reactive to light. EOM are normal.  Pulmonary/Chest: Effort normal.  Neurological: She is alert and oriented to person, place, and time.  Skin: Skin is warm and dry.  Psychiatric: She has a normal mood and affect. Her behavior is normal.    Ortho Exam awake alert and oriented x3.  Comfortable sitting very minimal effusion right knee.  Full extension about 95 to 100 degrees of flexion.  No instability.  Predominant medial joint pain today edema both of feet and ankles.  Does have some altered sensibility consistent with "neuropathy" Specialty Comments:  No specialty comments available.  Imaging: No results found.   PMFS History: Patient Active Problem List   Diagnosis Date Noted  . Bilateral primary osteoarthritis of knee 11/03/2017  . Dysphagia, unspecified(787.20) 06/07/2013  .  Unspecified hypothyroidism 10/10/2012  . High cholesterol 10/10/2012  . Essential hypertension, benign 10/10/2012  . Rectal bleeding 10/10/2012  . Pill dysphagia 10/10/2012  . Family hx of colon cancer 10/10/2012  . Anemia 10/10/2012   Past Medical History:  Diagnosis Date  . Cancer [facility])    left breast cancer  . Carotid artery occlusion   . Diabetes mellitus without complication (HCC)    diet controlled  . Family hx of colon cancer     . GERD (gastroesophageal reflux disease)   . High cholesterol   . History of gout   . Hypertension    for over 20 yrs  . Hypothyroid   . Neuropathy   . Pill dysphagia     Family History  Problem Relation Age of Onset  . Colon cancer Brother     Past Surgical History:  Procedure Laterality Date  . BACK SURGERY     5 rods and 5 screws and cage in back, has had 4 back surgeries.  Marland Kitchen BREAST LUMPECTOMY     left breast  . CATARACT EXTRACTION W/PHACO Left 12/31/2014   Procedure: CATARACT EXTRACTION PHACO AND INTRAOCULAR LENS PLACEMENT (IOC);  Surgeon: Tonny Branch, MD;  Location: [facility];  Service: Ophthalmology;  Laterality: Left;  CDE:8.45  . CATARACT EXTRACTION W/PHACO Right 01/28/2015   Procedure: CATARACT EXTRACTION PHACO AND INTRAOCULAR LENS PLACEMENT RIGHT EYE;  Surgeon: Tonny Branch, MD;  Location: [facility];  Service: Ophthalmology;  Laterality: Right;  CDE:4.92  . CHOLECYSTECTOMY    . COLONOSCOPY N/A 10/21/2012   Procedure: COLONOSCOPY;  Surgeon: Rogene Houston, MD;  Location: [facility];  Service: Endoscopy;  Laterality: N/A;  . ESOPHAGOGASTRODUODENOSCOPY (EGD) WITH ESOPHAGEAL DILATION N/A 06/09/2013   Procedure: ESOPHAGOGASTRODUODENOSCOPY (EGD) WITH ESOPHAGEAL DILATION;  Surgeon: Rogene Houston, MD;  [facility]: [facility];  Service: Endoscopy;  Laterality: N/A;  125  . KNEE SURGERY Right    arthroscopy x2  . SPINE SURGERY     3 surgeries by Dr. Jenne Campus,  Last surgery 2 years ago by Dr. Carloyn Manner in [place] History  . Not on file  Tobacco Use  . Smoking status: Never Smoker  . Smokeless tobacco: Never Used  Substance and Sexual Activity  . Alcohol use: No    Alcohol/week: 0.0 oz  . Drug use: No  . Sexual activity: Yes    Birth control/protection: Post-menopausal

## 2017-11-22 DIAGNOSIS — E119 Type 2 diabetes mellitus without complications: Secondary | ICD-10-CM | POA: Diagnosis not present

## 2017-11-22 DIAGNOSIS — I10 Essential (primary) hypertension: Secondary | ICD-10-CM | POA: Diagnosis not present

## 2017-11-23 DIAGNOSIS — M5124 Other intervertebral disc displacement, thoracic region: Secondary | ICD-10-CM | POA: Diagnosis not present

## 2017-11-23 DIAGNOSIS — M47816 Spondylosis without myelopathy or radiculopathy, lumbar region: Secondary | ICD-10-CM | POA: Diagnosis not present

## 2017-11-24 DIAGNOSIS — L97311 Non-pressure chronic ulcer of right ankle limited to breakdown of skin: Secondary | ICD-10-CM | POA: Diagnosis not present

## 2017-11-24 DIAGNOSIS — M25571 Pain in right ankle and joints of right foot: Secondary | ICD-10-CM | POA: Diagnosis not present

## 2017-11-24 DIAGNOSIS — M7989 Other specified soft tissue disorders: Secondary | ICD-10-CM | POA: Diagnosis not present

## 2017-11-24 DIAGNOSIS — I83018 Varicose veins of right lower extremity with ulcer other part of lower leg: Secondary | ICD-10-CM | POA: Diagnosis not present

## 2017-11-24 DIAGNOSIS — L97811 Non-pressure chronic ulcer of other part of right lower leg limited to breakdown of skin: Secondary | ICD-10-CM | POA: Diagnosis not present

## 2017-11-24 DIAGNOSIS — T8131XD Disruption of external operation (surgical) wound, not elsewhere classified, subsequent encounter: Secondary | ICD-10-CM | POA: Diagnosis not present

## 2017-12-01 DIAGNOSIS — T8131XD Disruption of external operation (surgical) wound, not elsewhere classified, subsequent encounter: Secondary | ICD-10-CM | POA: Diagnosis not present

## 2017-12-01 DIAGNOSIS — I83013 Varicose veins of right lower extremity with ulcer of ankle: Secondary | ICD-10-CM | POA: Diagnosis not present

## 2017-12-01 DIAGNOSIS — L97311 Non-pressure chronic ulcer of right ankle limited to breakdown of skin: Secondary | ICD-10-CM | POA: Diagnosis not present

## 2017-12-08 DIAGNOSIS — I83013 Varicose veins of right lower extremity with ulcer of ankle: Secondary | ICD-10-CM | POA: Diagnosis not present

## 2017-12-08 DIAGNOSIS — L97311 Non-pressure chronic ulcer of right ankle limited to breakdown of skin: Secondary | ICD-10-CM | POA: Diagnosis not present

## 2017-12-08 DIAGNOSIS — Z853 Personal history of malignant neoplasm of breast: Secondary | ICD-10-CM | POA: Diagnosis not present

## 2017-12-08 DIAGNOSIS — Z4801 Encounter for change or removal of surgical wound dressing: Secondary | ICD-10-CM | POA: Diagnosis not present

## 2017-12-08 DIAGNOSIS — M545 Low back pain: Secondary | ICD-10-CM | POA: Diagnosis not present

## 2017-12-08 DIAGNOSIS — I10 Essential (primary) hypertension: Secondary | ICD-10-CM | POA: Diagnosis not present

## 2017-12-08 DIAGNOSIS — Z7982 Long term (current) use of aspirin: Secondary | ICD-10-CM | POA: Diagnosis not present

## 2017-12-08 DIAGNOSIS — R600 Localized edema: Secondary | ICD-10-CM | POA: Diagnosis not present

## 2017-12-08 DIAGNOSIS — T8131XD Disruption of external operation (surgical) wound, not elsewhere classified, subsequent encounter: Secondary | ICD-10-CM | POA: Diagnosis not present

## 2017-12-08 DIAGNOSIS — Z8781 Personal history of (healed) traumatic fracture: Secondary | ICD-10-CM | POA: Diagnosis not present

## 2017-12-15 DIAGNOSIS — M545 Low back pain: Secondary | ICD-10-CM | POA: Diagnosis not present

## 2017-12-15 DIAGNOSIS — Z853 Personal history of malignant neoplasm of breast: Secondary | ICD-10-CM | POA: Diagnosis not present

## 2017-12-15 DIAGNOSIS — T8131XD Disruption of external operation (surgical) wound, not elsewhere classified, subsequent encounter: Secondary | ICD-10-CM | POA: Diagnosis not present

## 2017-12-15 DIAGNOSIS — Z4801 Encounter for change or removal of surgical wound dressing: Secondary | ICD-10-CM | POA: Diagnosis not present

## 2017-12-15 DIAGNOSIS — R600 Localized edema: Secondary | ICD-10-CM | POA: Diagnosis not present

## 2017-12-15 DIAGNOSIS — I10 Essential (primary) hypertension: Secondary | ICD-10-CM | POA: Diagnosis not present

## 2017-12-22 DIAGNOSIS — Z853 Personal history of malignant neoplasm of breast: Secondary | ICD-10-CM | POA: Diagnosis not present

## 2017-12-22 DIAGNOSIS — Z4801 Encounter for change or removal of surgical wound dressing: Secondary | ICD-10-CM | POA: Diagnosis not present

## 2017-12-22 DIAGNOSIS — R600 Localized edema: Secondary | ICD-10-CM | POA: Diagnosis not present

## 2017-12-22 DIAGNOSIS — I10 Essential (primary) hypertension: Secondary | ICD-10-CM | POA: Diagnosis not present

## 2017-12-22 DIAGNOSIS — T8131XD Disruption of external operation (surgical) wound, not elsewhere classified, subsequent encounter: Secondary | ICD-10-CM | POA: Diagnosis not present

## 2017-12-22 DIAGNOSIS — M545 Low back pain: Secondary | ICD-10-CM | POA: Diagnosis not present

## 2017-12-29 DIAGNOSIS — I83013 Varicose veins of right lower extremity with ulcer of ankle: Secondary | ICD-10-CM | POA: Diagnosis not present

## 2017-12-29 DIAGNOSIS — T8131XD Disruption of external operation (surgical) wound, not elsewhere classified, subsequent encounter: Secondary | ICD-10-CM | POA: Diagnosis not present

## 2017-12-29 DIAGNOSIS — L97311 Non-pressure chronic ulcer of right ankle limited to breakdown of skin: Secondary | ICD-10-CM | POA: Diagnosis not present

## 2018-01-05 DIAGNOSIS — R600 Localized edema: Secondary | ICD-10-CM | POA: Diagnosis not present

## 2018-01-05 DIAGNOSIS — T8131XD Disruption of external operation (surgical) wound, not elsewhere classified, subsequent encounter: Secondary | ICD-10-CM | POA: Diagnosis not present

## 2018-01-05 DIAGNOSIS — Z853 Personal history of malignant neoplasm of breast: Secondary | ICD-10-CM | POA: Diagnosis not present

## 2018-01-05 DIAGNOSIS — I10 Essential (primary) hypertension: Secondary | ICD-10-CM | POA: Diagnosis not present

## 2018-01-05 DIAGNOSIS — Z4801 Encounter for change or removal of surgical wound dressing: Secondary | ICD-10-CM | POA: Diagnosis not present

## 2018-01-05 DIAGNOSIS — M545 Low back pain: Secondary | ICD-10-CM | POA: Diagnosis not present

## 2018-01-07 DIAGNOSIS — R600 Localized edema: Secondary | ICD-10-CM | POA: Diagnosis not present

## 2018-01-07 DIAGNOSIS — M79606 Pain in leg, unspecified: Secondary | ICD-10-CM | POA: Diagnosis not present

## 2018-01-07 DIAGNOSIS — I10 Essential (primary) hypertension: Secondary | ICD-10-CM | POA: Diagnosis not present

## 2018-01-07 DIAGNOSIS — E1142 Type 2 diabetes mellitus with diabetic polyneuropathy: Secondary | ICD-10-CM | POA: Diagnosis not present

## 2018-01-07 DIAGNOSIS — Z6832 Body mass index (BMI) 32.0-32.9, adult: Secondary | ICD-10-CM | POA: Diagnosis not present

## 2018-01-07 DIAGNOSIS — Z299 Encounter for prophylactic measures, unspecified: Secondary | ICD-10-CM | POA: Diagnosis not present

## 2018-01-07 DIAGNOSIS — E1165 Type 2 diabetes mellitus with hyperglycemia: Secondary | ICD-10-CM | POA: Diagnosis not present

## 2018-01-12 ENCOUNTER — Other Ambulatory Visit (INDEPENDENT_AMBULATORY_CARE_PROVIDER_SITE_OTHER): Payer: Self-pay | Admitting: Orthopaedic Surgery

## 2018-01-13 DIAGNOSIS — E119 Type 2 diabetes mellitus without complications: Secondary | ICD-10-CM | POA: Diagnosis not present

## 2018-01-13 DIAGNOSIS — I10 Essential (primary) hypertension: Secondary | ICD-10-CM | POA: Diagnosis not present

## 2018-01-26 ENCOUNTER — Ambulatory Visit (INDEPENDENT_AMBULATORY_CARE_PROVIDER_SITE_OTHER): Payer: Medicare Other | Admitting: Orthopaedic Surgery

## 2018-01-26 ENCOUNTER — Encounter (INDEPENDENT_AMBULATORY_CARE_PROVIDER_SITE_OTHER): Payer: Self-pay | Admitting: Orthopaedic Surgery

## 2018-01-26 VITALS — BP 149/70 | HR 64 | Ht 60.5 in | Wt 175.0 lb

## 2018-01-26 DIAGNOSIS — M1712 Unilateral primary osteoarthritis, left knee: Secondary | ICD-10-CM | POA: Diagnosis not present

## 2018-01-26 DIAGNOSIS — M170 Bilateral primary osteoarthritis of knee: Secondary | ICD-10-CM

## 2018-01-26 DIAGNOSIS — M1711 Unilateral primary osteoarthritis, right knee: Secondary | ICD-10-CM

## 2018-01-26 MED ORDER — METHYLPREDNISOLONE ACETATE 40 MG/ML IJ SUSP
80.0000 mg | INTRAMUSCULAR | Status: AC | PRN
  Administered 2018-01-26: 80 mg

## 2018-01-26 MED ORDER — LIDOCAINE HCL 1 % IJ SOLN
2.0000 mL | INTRAMUSCULAR | Status: AC | PRN
  Administered 2018-01-26: 2 mL

## 2018-01-26 MED ORDER — BUPIVACAINE HCL 0.5 % IJ SOLN
2.0000 mL | INTRAMUSCULAR | Status: AC | PRN
  Administered 2018-01-26: 2 mL via INTRA_ARTICULAR

## 2018-01-26 NOTE — Progress Notes (Signed)
Office Visit Note   Patient: Rhonda Mejia           Date of Birth: 11/23/1928           MRN: [id number] Visit Date: 01/26/2018              Requested by: Monico Blitz, MD [address] [place], [place] [address] PCP: Monico Blitz, MD   Assessment & Plan: Visit Diagnoses:  1. Bilateral primary osteoarthritis of knee     Plan: Osteoarthritis both knees.  Presently more symptomatic on the left.  With cortisone.  Office 2 weeks to consider injecting right knee  Follow-Up Instructions: Return in about 2 weeks (around 02/09/2018).   Orders:  Orders Placed This Encounter  Procedures  . Large Joint Inj: L knee   No orders of the defined types were placed in this encounter.     Procedures: Large Joint Inj: L knee on 01/26/2018 1:36 PM Indications: pain and diagnostic evaluation Details: 25 G 1.5 in needle, anteromedial approach  Arthrogram: No  Medications: 2 mL lidocaine 1 %; 2 mL bupivacaine 0.5 %; 80 mg methylPREDNISolone acetate 40 MG/ML Procedure, treatment alternatives, risks and benefits explained, specific risks discussed. Consent was given by the patient. Patient was prepped and draped in the usual sterile fashion.       Clinical Data: No additional findings.   Subjective: Chief Complaint  Patient presents with  . Follow-up    BIL LAT FOOT PAIN, SWELLING & NUMBNESS FOR SEV YRS AND GETTING WORSE  Mrs. Rhonda Mejia has been followed for the osteoarthritis of both of her knees.  On occasion she is had cortisone injection.  The last was performed in her right knee approximately 2 months ago.  She is having some discomfort now in her right knee would like a cortisone injection.  She does have chronic issues with her neck and her low back and is being followed by Dr. Carloyn Manner.  She is having some lower extremity swelling evaluated by her primary care physician and some foot pain which we can evaluate at some point in the future.  Her films demonstrate osteoarthritis with some  evidence of chondrocalcinosis both knees  HPI  Review of Systems  Constitutional: Positive for fatigue. Negative for fever.  HENT: Negative for ear pain.   Eyes: Negative for pain.  Respiratory: Positive for shortness of breath. Negative for cough.   Cardiovascular: Positive for leg swelling.  Gastrointestinal: Positive for constipation. Negative for diarrhea.  Genitourinary: Negative for difficulty urinating.  Musculoskeletal: Positive for back pain and neck pain.  Skin: Negative for rash.  Allergic/Immunologic: Negative for food allergies.  Neurological: Positive for weakness and numbness.  Hematological: Bruises/bleeds easily.  Psychiatric/Behavioral: Negative for sleep disturbance.     Objective: Vital Signs: BP (!) 149/70 (BP Location: Right Arm, Patient Position: Sitting, Cuff Size: Normal)   Pulse 64   Ht 5' 0.5" (1.537 m)   Wt 175 lb (79.4 kg)   BMI 33.61 kg/m   Physical Exam  Ortho Exam awake alert and oriented x3.  Comfortable sitting does use a walker for ambulation.  Left knee was evaluated today with diffuse medial joint tenderness and may be a small effusion.  Full extension and flexed over 100 degrees without instability.  No calf pain.  No popliteal discomfort.  Straight leg raise negative.  Specialty Comments:  No specialty comments available.  Imaging: No results found.   PMFS History: Patient Active Problem List   Diagnosis Date Noted  . Bilateral  primary osteoarthritis of knee 11/03/2017  . Dysphagia, unspecified(787.20) 06/07/2013  . Unspecified hypothyroidism 10/10/2012  . High cholesterol 10/10/2012  . Essential hypertension, benign 10/10/2012  . Rectal bleeding 10/10/2012  . Pill dysphagia 10/10/2012  . Family hx of colon cancer 10/10/2012  . Anemia 10/10/2012   Past Medical History:  Diagnosis Date  . Cancer [facility])    left breast cancer  . Carotid artery occlusion   . Diabetes mellitus without complication (HCC)    diet controlled  .  Family hx of colon cancer   . GERD (gastroesophageal reflux disease)   . High cholesterol   . History of gout   . Hypertension    for over 20 yrs  . Hypothyroid   . Neuropathy   . Pill dysphagia     Family History  Problem Relation Age of Onset  . Colon cancer Brother     Past Surgical History:  Procedure Laterality Date  . BACK SURGERY     5 rods and 5 screws and cage in back, has had 4 back surgeries.  Marland Kitchen BREAST LUMPECTOMY     left breast  . CATARACT EXTRACTION W/PHACO Left 12/31/2014   Procedure: CATARACT EXTRACTION PHACO AND INTRAOCULAR LENS PLACEMENT (IOC);  Surgeon: Tonny Branch, MD;  Location: [facility];  Service: Ophthalmology;  Laterality: Left;  CDE:8.45  . CATARACT EXTRACTION W/PHACO Right 01/28/2015   Procedure: CATARACT EXTRACTION PHACO AND INTRAOCULAR LENS PLACEMENT RIGHT EYE;  Surgeon: Tonny Branch, MD;  Location: [facility];  Service: Ophthalmology;  Laterality: Right;  CDE:4.92  . CHOLECYSTECTOMY    . COLONOSCOPY N/A 10/21/2012   Procedure: COLONOSCOPY;  Surgeon: Rogene Houston, MD;  [facility];  Service: Endoscopy;  Laterality: N/A;  . ESOPHAGOGASTRODUODENOSCOPY (EGD) WITH ESOPHAGEAL DILATION N/A 06/09/2013   Procedure: ESOPHAGOGASTRODUODENOSCOPY (EGD) WITH ESOPHAGEAL DILATION;  Surgeon: Rogene Houston, MD;  [facility];  Service: Endoscopy;  Laterality: N/A;  125  . KNEE SURGERY Right    arthroscopy x2  . SPINE SURGERY     3 surgeries by Dr. Jenne Campus,  Last surgery 2 years ago by Dr. Carloyn Manner in [place] History  . Not on file  Tobacco Use  . Smoking status: Never Smoker  . Smokeless tobacco: Never Used  Substance and Sexual Activity  . Alcohol use: No    Alcohol/week: 0.0 oz  . Drug use: No  . Sexual activity: Yes    Birth control/protection: Post-menopausal

## 2018-02-09 ENCOUNTER — Encounter (INDEPENDENT_AMBULATORY_CARE_PROVIDER_SITE_OTHER): Payer: Self-pay | Admitting: Orthopaedic Surgery

## 2018-02-09 ENCOUNTER — Ambulatory Visit (INDEPENDENT_AMBULATORY_CARE_PROVIDER_SITE_OTHER): Payer: Medicare Other | Admitting: Orthopaedic Surgery

## 2018-02-09 VITALS — BP 136/78 | HR 65 | Ht 60.5 in | Wt 175.0 lb

## 2018-02-09 DIAGNOSIS — I6523 Occlusion and stenosis of bilateral carotid arteries: Secondary | ICD-10-CM | POA: Diagnosis not present

## 2018-02-09 DIAGNOSIS — M1712 Unilateral primary osteoarthritis, left knee: Secondary | ICD-10-CM | POA: Diagnosis not present

## 2018-02-09 MED ORDER — METHYLPREDNISOLONE ACETATE 40 MG/ML IJ SUSP
80.0000 mg | INTRAMUSCULAR | Status: AC | PRN
  Administered 2018-02-09: 80 mg

## 2018-02-09 MED ORDER — LIDOCAINE HCL 1 % IJ SOLN
2.0000 mL | INTRAMUSCULAR | Status: AC | PRN
  Administered 2018-02-09: 2 mL

## 2018-02-09 MED ORDER — BUPIVACAINE HCL 0.5 % IJ SOLN
2.0000 mL | INTRAMUSCULAR | Status: AC | PRN
  Administered 2018-02-09: 2 mL via INTRA_ARTICULAR

## 2018-02-09 NOTE — Progress Notes (Signed)
Office Visit Note   Patient: Rhonda Mejia           Date of Birth: 04-12-1929           MRN: [id number] Visit Date: 02/09/2018              Requested by: Rhonda Blitz, MD [address] [place], [place] [address] PCP: Rhonda Blitz, MD   Assessment & Plan: Visit Diagnoses:  1. Primary osteoarthritis of left knee     Plan:  1: Corticosteroid injection the left knee was accomplished atraumatically.  Follow-Up Instructions: Return if symptoms worsen or fail to improve.   Orders:  No orders of the defined types were placed in this encounter.  No orders of the defined types were placed in this encounter.     Procedures: Large Joint Inj: L knee on 02/09/2018 1:57 PM Indications: pain and diagnostic evaluation Details: 25 G 1.5 in needle, anteromedial approach  Arthrogram: No  Medications: 2 mL lidocaine 1 %; 2 mL bupivacaine 0.5 %; 80 mg methylPREDNISolone acetate 40 MG/ML Outcome: tolerated well, no immediate complications Procedure, treatment alternatives, risks and benefits explained, specific risks discussed. Consent was given by the patient. Immediately prior to procedure a time out was called to verify the correct patient, procedure, equipment, support staff and site/side marked as required. Patient was prepped and draped in the usual sterile fashion.       Clinical Data: No additional findings.   Subjective: Chief Complaint  Patient presents with  . Follow-up    KNEE INJECTION PT STATES WE DID THE RIGHT KNE LAST VISIT AND WOULD LIKE LEFT KNEE DONE TODAY    HPI  Rhonda Mejia returns today for evaluation of her knees.  Seen previously by Dr. Durward Mejia who performed a corticosteroid injection to her right knee with good results so far.  Was in today with worsening pain in her left knee and would like to consider corticosteroid injection to the left knee today..  She does have peripheral neuropathy.  Review of Systems  Constitutional: Positive for fatigue. Negative for  fever.  HENT: Negative for ear pain.   Eyes: Negative for pain.  Respiratory: Positive for shortness of breath. Negative for cough.   Cardiovascular: Positive for leg swelling.  Gastrointestinal: Positive for constipation. Negative for diarrhea.  Genitourinary: Negative for difficulty urinating.  Musculoskeletal: Positive for back pain and neck pain.  Skin: Negative for rash.  Allergic/Immunologic: Negative for food allergies.  Neurological: Positive for weakness and numbness.  Hematological: Bruises/bleeds easily.  Psychiatric/Behavioral: Negative for sleep disturbance.     Objective: Vital Signs: BP 136/78 (BP Location: Left Arm, Patient Position: Sitting, Cuff Size: Normal)   Pulse 65   Ht 5' 0.5" (1.537 m)   Wt 175 lb (79.4 kg)   BMI 33.61 kg/m   Physical Exam  Constitutional: She is oriented to person, place, and time. She appears well-developed and well-nourished.  HENT:  Mouth/Throat: Oropharynx is clear and moist.  Eyes: Pupils are equal, round, and reactive to light. EOM are normal.  Pulmonary/Chest: Effort normal.  Neurological: She is alert and oriented to person, place, and time.  Skin: Skin is warm and dry.  Psychiatric: She has a normal mood and affect. Her behavior is normal.    Ortho Exam  .Left knee is benign.  No warmth or erythema.  ROM just shy of full extension to 100 degrees.  Small effusion.  Some crepitus with ROM  Specialty Comments:  No specialty comments available.  Imaging: No results  found.   PMFS History:  Current Outpatient Medications  Medication Sig Dispense Refill  . allopurinol (ZYLOPRIM) 300 MG tablet Take 300 mg by mouth daily.    . AMITIZA 24 MCG capsule Take 24 mcg by mouth daily as needed.   1  . aspirin EC 81 MG tablet Take 81 mg by mouth daily.    . Cyanocobalamin (B-12) 2500 MCG TABS Take 2 tablets by mouth daily.     . folic acid (FOLVITE) 1 MG tablet Take 1 mg by mouth daily.    . furosemide (LASIX) 40 MG tablet Take  1.5 tablets (60 mg total) by mouth daily. 135 tablet 3  . gabapentin (NEURONTIN) 300 MG capsule Take 300 mg by mouth 2 (two) times daily.  2  . hydrALAZINE (APRESOLINE) 25 MG tablet Take 25 mg by mouth 2 (two) times daily.     Marland Kitchen levothyroxine (SYNTHROID, LEVOTHROID) 100 MCG tablet Take 100 mcg by mouth daily.    Marland Kitchen lisinopril (PRINIVIL,ZESTRIL) 40 MG tablet Take 40 mg by mouth daily.    . metoprolol succinate (TOPROL-XL) 50 MG 24 hr tablet Take 50 mg by mouth daily.  6  . Omega-3 Fatty Acids (FISH OIL PO) Take 1 capsule by mouth daily.    Marland Kitchen omeprazole (PRILOSEC) 40 MG capsule Take 40 mg by mouth daily.    Marland Kitchen pyridOXINE (VITAMIN B-6) 100 MG tablet Take 100 mg by mouth daily.    Marland Kitchen senna (SENOKOT) 8.6 MG tablet Take 1 tablet by mouth as needed for constipation.    . traMADol (ULTRAM) 50 MG tablet Take 50 mg by mouth every 6 (six) hours as needed for pain.    Marland Kitchen venlafaxine (EFFEXOR) 75 MG tablet Take 75 mg by mouth daily.     . VOLTAREN 1 % GEL APPLY 2-4 GRAMS TO THE AFFECTED AREA TWICE DAILY 200 g 2   No current facility-administered medications for this visit.     Patient Active Problem List   Diagnosis Date Noted  . Bilateral primary osteoarthritis of knee 11/03/2017  . Dysphagia, unspecified(787.20) 06/07/2013  . Unspecified hypothyroidism 10/10/2012  . High cholesterol 10/10/2012  . Essential hypertension, benign 10/10/2012  . Rectal bleeding 10/10/2012  . Pill dysphagia 10/10/2012  . Family hx of colon cancer 10/10/2012  . Anemia 10/10/2012   Past Medical History:  Diagnosis Date  . Cancer [facility])    left breast cancer  . Carotid artery occlusion   . Diabetes mellitus without complication (HCC)    diet controlled  . Family hx of colon cancer   . GERD (gastroesophageal reflux disease)   . High cholesterol   . History of gout   . Hypertension    for over 20 yrs  . Hypothyroid   . Neuropathy   . Pill dysphagia     Family History  Problem Relation Age of Onset  . Colon  cancer Brother     Past Surgical History:  Procedure Laterality Date  . BACK SURGERY     5 rods and 5 screws and cage in back, has had 4 back surgeries.  Marland Kitchen BREAST LUMPECTOMY     left breast  . CATARACT EXTRACTION W/PHACO Left 12/31/2014   Procedure: CATARACT EXTRACTION PHACO AND INTRAOCULAR LENS PLACEMENT (IOC);  Surgeon: Tonny Branch, MD;  Location: [facility];  Service: Ophthalmology;  Laterality: Left;  CDE:8.45  . CATARACT EXTRACTION W/PHACO Right 01/28/2015   Procedure: CATARACT EXTRACTION PHACO AND INTRAOCULAR LENS PLACEMENT RIGHT EYE;  Surgeon: Tonny Branch, MD;  Location: [facility];  Service: Ophthalmology;  Laterality: Right;  CDE:4.92  . CHOLECYSTECTOMY    . COLONOSCOPY N/A 10/21/2012   Procedure: COLONOSCOPY;  Surgeon: Rogene Houston, MD;  [facility];  Service: Endoscopy;  Laterality: N/A;  . ESOPHAGOGASTRODUODENOSCOPY (EGD) WITH ESOPHAGEAL DILATION N/A 06/09/2013   Procedure: ESOPHAGOGASTRODUODENOSCOPY (EGD) WITH ESOPHAGEAL DILATION;  Surgeon: Rogene Houston, MD;  [facility];  Service: Endoscopy;  Laterality: N/A;  125  . KNEE SURGERY Right    arthroscopy x2  . SPINE SURGERY     3 surgeries by Dr. Jenne Campus,  Last surgery 2 years ago by Dr. Carloyn Manner in [place] History  . Not on file  Tobacco Use  . Smoking status: Never Smoker  . Smokeless tobacco: Never Used  Substance and Sexual Activity  . Alcohol use: No    Alcohol/week: 0.0 oz  . Drug use: No  . Sexual activity: Yes    Birth control/protection: Post-menopausal

## 2018-02-14 DIAGNOSIS — E119 Type 2 diabetes mellitus without complications: Secondary | ICD-10-CM | POA: Diagnosis not present

## 2018-02-14 DIAGNOSIS — I10 Essential (primary) hypertension: Secondary | ICD-10-CM | POA: Diagnosis not present

## 2018-03-08 DIAGNOSIS — M5124 Other intervertebral disc displacement, thoracic region: Secondary | ICD-10-CM | POA: Diagnosis not present

## 2018-03-14 DIAGNOSIS — I10 Essential (primary) hypertension: Secondary | ICD-10-CM | POA: Diagnosis not present

## 2018-03-14 DIAGNOSIS — E119 Type 2 diabetes mellitus without complications: Secondary | ICD-10-CM | POA: Diagnosis not present

## 2018-03-22 DIAGNOSIS — S99922A Unspecified injury of left foot, initial encounter: Secondary | ICD-10-CM | POA: Diagnosis not present

## 2018-03-22 DIAGNOSIS — M5126 Other intervertebral disc displacement, lumbar region: Secondary | ICD-10-CM | POA: Diagnosis not present

## 2018-03-22 DIAGNOSIS — M7989 Other specified soft tissue disorders: Secondary | ICD-10-CM | POA: Diagnosis not present

## 2018-03-22 DIAGNOSIS — M4805 Spinal stenosis, thoracolumbar region: Secondary | ICD-10-CM | POA: Diagnosis not present

## 2018-03-22 DIAGNOSIS — M5124 Other intervertebral disc displacement, thoracic region: Secondary | ICD-10-CM | POA: Diagnosis not present

## 2018-03-22 DIAGNOSIS — M11262 Other chondrocalcinosis, left knee: Secondary | ICD-10-CM | POA: Diagnosis not present

## 2018-03-22 DIAGNOSIS — M5125 Other intervertebral disc displacement, thoracolumbar region: Secondary | ICD-10-CM | POA: Diagnosis not present

## 2018-03-22 DIAGNOSIS — M48061 Spinal stenosis, lumbar region without neurogenic claudication: Secondary | ICD-10-CM | POA: Diagnosis not present

## 2018-03-22 DIAGNOSIS — M4316 Spondylolisthesis, lumbar region: Secondary | ICD-10-CM | POA: Diagnosis not present

## 2018-03-22 DIAGNOSIS — M5127 Other intervertebral disc displacement, lumbosacral region: Secondary | ICD-10-CM | POA: Diagnosis not present

## 2018-03-22 DIAGNOSIS — S8992XA Unspecified injury of left lower leg, initial encounter: Secondary | ICD-10-CM | POA: Diagnosis not present

## 2018-03-22 DIAGNOSIS — Z981 Arthrodesis status: Secondary | ICD-10-CM | POA: Diagnosis not present

## 2018-03-22 DIAGNOSIS — S99912A Unspecified injury of left ankle, initial encounter: Secondary | ICD-10-CM | POA: Diagnosis not present

## 2018-03-22 DIAGNOSIS — Z9181 History of falling: Secondary | ICD-10-CM | POA: Diagnosis not present

## 2018-03-24 DIAGNOSIS — S82892A Other fracture of left lower leg, initial encounter for closed fracture: Secondary | ICD-10-CM | POA: Diagnosis not present

## 2018-03-24 DIAGNOSIS — M5124 Other intervertebral disc displacement, thoracic region: Secondary | ICD-10-CM | POA: Diagnosis not present

## 2018-03-30 DIAGNOSIS — W19XXXA Unspecified fall, initial encounter: Secondary | ICD-10-CM | POA: Diagnosis not present

## 2018-03-30 DIAGNOSIS — S8265XA Nondisplaced fracture of lateral malleolus of left fibula, initial encounter for closed fracture: Secondary | ICD-10-CM | POA: Diagnosis not present

## 2018-04-05 ENCOUNTER — Ambulatory Visit (INDEPENDENT_AMBULATORY_CARE_PROVIDER_SITE_OTHER): Payer: Medicare Other | Admitting: Cardiology

## 2018-04-05 ENCOUNTER — Encounter: Payer: Self-pay | Admitting: *Deleted

## 2018-04-05 ENCOUNTER — Encounter: Payer: Self-pay | Admitting: Cardiology

## 2018-04-05 VITALS — BP 169/71 | HR 58 | Ht 65.0 in | Wt 199.0 lb

## 2018-04-05 DIAGNOSIS — I10 Essential (primary) hypertension: Secondary | ICD-10-CM

## 2018-04-05 DIAGNOSIS — I6523 Occlusion and stenosis of bilateral carotid arteries: Secondary | ICD-10-CM | POA: Diagnosis not present

## 2018-04-05 DIAGNOSIS — R600 Localized edema: Secondary | ICD-10-CM | POA: Diagnosis not present

## 2018-04-05 MED ORDER — HYDRALAZINE HCL 50 MG PO TABS: 50.0000 mg | ORAL_TABLET | Freq: Three times a day (TID) | ORAL | 1 refills | Status: DC

## 2018-04-05 NOTE — Progress Notes (Signed)
Clinical Summary Rhonda Mejia is a 84 y.o.female seen today for follow up of the following medical problems.  1. HTN - she is compliant with meds.    2. LE edema - echo 02/2015 difficult study ,with LVEF 50-55%, normal RV function. Diastolic function not described.  - norvasc stopped  due to LE edema. Lasix increased to 40mg  daily. Edema has improved  - repeat echo 09/2015 LVEF 60-65%, abnormal diastolic function grade indeterminate   - followed by vascular for chronic venous insufficiency -unable to wear compression stockings due to ankle fracture  - taking lasix prn.    3. Carotid stenosis - carotid US 09/2015 with concern for distal left CCA stenosis, 1-39% right ICA stenosis.  - 11/2015 CTA neck with 03% LICA stenosis, focal contained dissecion left ICA, no significant RICA disease - followed by vascular. From there consultation CTA results thought to be misleading due to heavy calficications and fairly mild velocities by carotid US.    4. Back pain - followed by Dr Carloyn Manner  5. DM2 -she isdiet controlled, followed by pcp  6. Right angle fracture - has finally healed.     Past Medical History:  Diagnosis Date  . Cancer [facility])    left breast cancer  . Carotid artery occlusion   . Diabetes mellitus without complication (HCC)    diet controlled  . Family hx of colon cancer   . GERD (gastroesophageal reflux disease)   . High cholesterol   . History of gout   . Hypertension    for over 20 yrs  . Hypothyroid   . Neuropathy   . Pill dysphagia      Allergies  Allergen Reactions  . Lipitor [Atorvastatin] Other (See Comments)    Didn't agree with stomach  . Penicillins     Rash   . Sulfa Antibiotics     Unknown reaction      Current Outpatient Medications  Medication Sig Dispense Refill  . allopurinol (ZYLOPRIM) 300 MG tablet Take 300 mg by mouth daily.    . AMITIZA 24 MCG capsule Take 24 mcg by mouth daily as needed.   1  . aspirin  EC 81 MG tablet Take 81 mg by mouth daily.    . Cyanocobalamin (B-12) 2500 MCG TABS Take 2 tablets by mouth daily.     . folic acid (FOLVITE) 1 MG tablet Take 1 mg by mouth daily.    . furosemide (LASIX) 40 MG tablet Take 1.5 tablets (60 mg total) by mouth daily. 135 tablet 3  . gabapentin (NEURONTIN) 300 MG capsule Take 300 mg by mouth 2 (two) times daily.  2  . hydrALAZINE (APRESOLINE) 25 MG tablet Take 25 mg by mouth 2 (two) times daily.     Marland Kitchen levothyroxine (SYNTHROID, LEVOTHROID) 100 MCG tablet Take 100 mcg by mouth daily.    Marland Kitchen lisinopril (PRINIVIL,ZESTRIL) 40 MG tablet Take 40 mg by mouth daily.    . metoprolol succinate (TOPROL-XL) 50 MG 24 hr tablet Take 50 mg by mouth daily.  6  . Omega-3 Fatty Acids (FISH OIL PO) Take 1 capsule by mouth daily.    Marland Kitchen omeprazole (PRILOSEC) 40 MG capsule Take 40 mg by mouth daily.    Marland Kitchen pyridOXINE (VITAMIN B-6) 100 MG tablet Take 100 mg by mouth daily.    Marland Kitchen senna (SENOKOT) 8.6 MG tablet Take 1 tablet by mouth as needed for constipation.    . traMADol (ULTRAM) 50 MG tablet Take 50 mg by mouth every  6 (six) hours as needed for pain.    Marland Kitchen venlafaxine (EFFEXOR) 75 MG tablet Take 75 mg by mouth daily.     . VOLTAREN 1 % GEL APPLY 2-4 GRAMS TO THE AFFECTED AREA TWICE DAILY 200 g 2   No current facility-administered medications for this visit.      Past Surgical History:  Procedure Laterality Date  . BACK SURGERY     5 rods and 5 screws and cage in back, has had 4 back surgeries.  Marland Kitchen BREAST LUMPECTOMY     left breast  . CATARACT EXTRACTION W/PHACO Left 12/31/2014   Procedure: CATARACT EXTRACTION PHACO AND INTRAOCULAR LENS PLACEMENT (IOC);  Surgeon: Tonny Konor Noren, MD;  [facility];  Service: Ophthalmology;  Laterality: Left;  CDE:8.45  . CATARACT EXTRACTION W/PHACO Right 01/28/2015   Procedure: CATARACT EXTRACTION PHACO AND INTRAOCULAR LENS PLACEMENT RIGHT EYE;  Surgeon: Tonny Amberle Lyter, MD;  Location: [facility];  Service: Ophthalmology;  Laterality: Right;   CDE:4.92  . CHOLECYSTECTOMY    . COLONOSCOPY N/A 10/21/2012   Procedure: COLONOSCOPY;  Surgeon: Rogene Houston, MD;  [facility];  Service: Endoscopy;  Laterality: N/A;  . ESOPHAGOGASTRODUODENOSCOPY (EGD) WITH ESOPHAGEAL DILATION N/A 06/09/2013   Procedure: ESOPHAGOGASTRODUODENOSCOPY (EGD) WITH ESOPHAGEAL DILATION;  Surgeon: Rogene Houston, MD;  [facility];  Service: Endoscopy;  Laterality: N/A;  125  . KNEE SURGERY Right    arthroscopy x2  . SPINE SURGERY     3 surgeries by Dr. Jenne Campus,  Last surgery 2 years ago by Dr. Carloyn Manner in [place] Reactions  . Lipitor [Atorvastatin] Other (See Comments)    Didn't agree with stomach  . Penicillins     Rash   . Sulfa Antibiotics     Unknown reaction       Family History  Problem Relation Age of Onset  . Colon cancer Brother      Social History Ms. Silbaugh reports that she has never smoked. She has never used smokeless tobacco. Ms. Reasner reports that she does not drink alcohol.   Review of Systems CONSTITUTIONAL: No weight loss, fever, chills, weakness or fatigue.  HEENT: Eyes: No visual loss, blurred vision, double vision or yellow sclerae.No hearing loss, sneezing, congestion, runny nose or sore throat.  SKIN: No rash or itching.  CARDIOVASCULAR: per hpi RESPIRATORY: No shortness of breath, cough or sputum.  GASTROINTESTINAL: No anorexia, nausea, vomiting or diarrhea. No abdominal pain or blood.  GENITOURINARY: No burning on urination, no polyuria NEUROLOGICAL: No headache, dizziness, syncope, paralysis, ataxia, numbness or tingling in the extremities. No change in bowel or bladder control.  MUSCULOSKELETAL: No muscle, back pain, joint pain or stiffness.  LYMPHATICS: No enlarged nodes. No history of splenectomy.  PSYCHIATRIC: No history of depression or anxiety.  ENDOCRINOLOGIC: No reports of sweating, cold or heat intolerance. No polyuria or polydipsia.  Marland Kitchen   Physical  Examination Vitals:   04/05/18 1118  BP: (!) 169/71  Pulse: (!) 58   Vitals:   04/05/18 1118  Weight: 199 lb (90.3 kg)  Height: 5\' 5"  (1.651 m)    Gen: resting comfortably, no acute distress HEENT: no scleral icterus, pupils equal round and reactive, no palptable cervical adenopathy,  CV: RRR, no m/r/g. Bilateral  Resp: Clear to auscultation bilaterally GI: abdomen is soft, non-tender, non-distended, normal bowel sounds, no hepatosplenomegaly MSK: extremities are warm, no edema.  Skin: warm, no rash Neuro:  no focal deficits Psych: appropriate affect  Diagnostic Studies 09/2015 echo Study Conclusions  - Left ventricle: The cavity size was normal. Wall thickness was  increased in a pattern of mild LVH. Systolic function was normal.  The estimated ejection fraction was in the range of 60% to 65%.  Diastolic function is abnormal, indeterminate grade. Wall motion  was normal; there were no regional wall motion abnormalities. - Aortic valve: Mildly calcified annulus. Trileaflet; mildly  thickened leaflets. Valve area (VTI): 1.52 cm^2. Valve area  (Vmax): 1.43 cm^2. - Mitral valve: Mildly calcified annulus. Normal thickness leaflets  . - Technically adequate study.   11/2015 CTA neck IMPRESSION: 1. 75% stenosis of the left internal carotid artery at the bifurcation secondary to prominent posterior lateral soft tissue neck calcified plaque. 2. Atherosclerotic calcifications at the right carotid bifurcation and origins the great vessels without other focal stenoses. 3. 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 4. Multilevel spondylosis of the cervical spine is most pronounced at C4-5 and C5-6.       Assessment and Plan  1. HTN  - above goal, manual recheck 158/75 - increase hydralazine to 50mg  tid, submit bp log in 2 weeks.   2. LE edema - continue current  diuretics         Arnoldo Lenis, M.D.

## 2018-04-05 NOTE — Patient Instructions (Addendum)
Your physician recommends that you schedule a follow-up appointment in: [place] has recommended you make the following change in your medication:   [place] physician has requested that you regularly monitor and record your blood pressure readings at home FOR 2 [place]. Please use the same machine at the same time of day to check your readings and record them  Thank you for choosing [facility]

## 2018-04-07 DIAGNOSIS — E119 Type 2 diabetes mellitus without complications: Secondary | ICD-10-CM | POA: Diagnosis not present

## 2018-04-07 DIAGNOSIS — I10 Essential (primary) hypertension: Secondary | ICD-10-CM | POA: Diagnosis not present

## 2018-04-12 DIAGNOSIS — E1165 Type 2 diabetes mellitus with hyperglycemia: Secondary | ICD-10-CM | POA: Diagnosis not present

## 2018-04-12 DIAGNOSIS — E782 Mixed hyperlipidemia: Secondary | ICD-10-CM | POA: Diagnosis not present

## 2018-04-12 DIAGNOSIS — Z23 Encounter for immunization: Secondary | ICD-10-CM | POA: Diagnosis not present

## 2018-04-12 DIAGNOSIS — E041 Nontoxic single thyroid nodule: Secondary | ICD-10-CM | POA: Diagnosis not present

## 2018-04-12 DIAGNOSIS — Z1339 Encounter for screening examination for other mental health and behavioral disorders: Secondary | ICD-10-CM | POA: Diagnosis not present

## 2018-04-12 DIAGNOSIS — E1142 Type 2 diabetes mellitus with diabetic polyneuropathy: Secondary | ICD-10-CM | POA: Diagnosis not present

## 2018-04-12 DIAGNOSIS — Z7189 Other specified counseling: Secondary | ICD-10-CM | POA: Diagnosis not present

## 2018-04-12 DIAGNOSIS — Z0000 Encounter for general adult medical examination without abnormal findings: Secondary | ICD-10-CM | POA: Diagnosis not present

## 2018-04-12 DIAGNOSIS — Z79899 Other long term (current) drug therapy: Secondary | ICD-10-CM | POA: Diagnosis not present

## 2018-04-12 DIAGNOSIS — R0981 Nasal congestion: Secondary | ICD-10-CM | POA: Diagnosis not present

## 2018-04-12 DIAGNOSIS — R5383 Other fatigue: Secondary | ICD-10-CM | POA: Diagnosis not present

## 2018-04-12 DIAGNOSIS — E559 Vitamin D deficiency, unspecified: Secondary | ICD-10-CM | POA: Diagnosis not present

## 2018-04-12 DIAGNOSIS — Z1331 Encounter for screening for depression: Secondary | ICD-10-CM | POA: Diagnosis not present

## 2018-04-12 DIAGNOSIS — Z6832 Body mass index (BMI) 32.0-32.9, adult: Secondary | ICD-10-CM | POA: Diagnosis not present

## 2018-04-12 DIAGNOSIS — Z299 Encounter for prophylactic measures, unspecified: Secondary | ICD-10-CM | POA: Diagnosis not present

## 2018-04-12 DIAGNOSIS — Z1211 Encounter for screening for malignant neoplasm of colon: Secondary | ICD-10-CM | POA: Diagnosis not present

## 2018-04-19 DIAGNOSIS — E1140 Type 2 diabetes mellitus with diabetic neuropathy, unspecified: Secondary | ICD-10-CM | POA: Diagnosis not present

## 2018-04-19 DIAGNOSIS — E1159 Type 2 diabetes mellitus with other circulatory complications: Secondary | ICD-10-CM | POA: Diagnosis not present

## 2018-04-19 DIAGNOSIS — H81393 Other peripheral vertigo, bilateral: Secondary | ICD-10-CM | POA: Diagnosis not present

## 2018-04-20 ENCOUNTER — Encounter (INDEPENDENT_AMBULATORY_CARE_PROVIDER_SITE_OTHER): Payer: Self-pay | Admitting: Orthopaedic Surgery

## 2018-04-20 ENCOUNTER — Ambulatory Visit (INDEPENDENT_AMBULATORY_CARE_PROVIDER_SITE_OTHER): Payer: Medicare Other | Admitting: Orthopaedic Surgery

## 2018-04-20 VITALS — BP 167/75 | HR 73 | Ht 65.0 in | Wt 199.0 lb

## 2018-04-20 DIAGNOSIS — I6523 Occlusion and stenosis of bilateral carotid arteries: Secondary | ICD-10-CM | POA: Diagnosis not present

## 2018-04-20 DIAGNOSIS — M170 Bilateral primary osteoarthritis of knee: Secondary | ICD-10-CM

## 2018-04-20 MED ORDER — METHYLPREDNISOLONE ACETATE 40 MG/ML IJ SUSP
80.0000 mg | INTRAMUSCULAR | Status: AC | PRN
  Administered 2018-04-20: 80 mg

## 2018-04-20 MED ORDER — BUPIVACAINE HCL 0.5 % IJ SOLN
2.0000 mL | INTRAMUSCULAR | Status: AC | PRN
  Administered 2018-04-20: 2 mL via INTRA_ARTICULAR

## 2018-04-20 MED ORDER — LIDOCAINE HCL 1 % IJ SOLN
2.0000 mL | INTRAMUSCULAR | Status: AC | PRN
  Administered 2018-04-20: 2 mL

## 2018-04-20 NOTE — Progress Notes (Signed)
Office Visit Note   Patient: Rhonda Mejia           Date of Birth: 11-25-1928           MRN: [id number] Visit Date: 04/20/2018              Requested by: Monico Blitz, MD [address] [place], [place] [address] PCP: Monico Blitz, MD   Assessment & Plan: Visit Diagnoses:  1. Bilateral primary osteoarthritis of knee     Plan: Cortisone injection right knee.  Office 2 weeks to consider injection left knee  Follow-Up Instructions: Return in about 2 weeks (around 05/04/2018).   Orders:  Orders Placed This Encounter  Procedures  . Large Joint Inj: R knee   No orders of the defined types were placed in this encounter.     Procedures: Large Joint Inj: R knee on 04/20/2018 2:17 PM Indications: pain and diagnostic evaluation Details: 25 G 1.5 in needle, anteromedial approach  Arthrogram: No  Medications: 2 mL lidocaine 1 %; 2 mL bupivacaine 0.5 %; 80 mg methylPREDNISolone acetate 40 MG/ML Procedure, treatment alternatives, risks and benefits explained, specific risks discussed. Consent was given by the patient. Immediately prior to procedure a time out was called to verify the correct patient, procedure, equipment, support staff and site/side marked as required. Patient was prepped and draped in the usual sterile fashion.       Clinical Data: No additional findings.   Subjective: Chief Complaint  Patient presents with  . Follow-up    RE CURRENT R KNEE PAIN WOULD LIKE INJECTION, LAST XRAY WAS 08/19/2016  Rhonda Mejia has been seen on a number of occasions in the past for evaluation of osteoarthritis of both of her knees.  I have injected these in the past with good temporary relief of her pain.  Higher films demonstrate significant decrease in the lateral joint space on the right with subchondral sclerosis and probable CPPD.  Having more trouble with the right than the left knee.  Also has chronic back pain and is taking medicines per her primary care physician.  She feels  like a lot of her pain is related to her back.  She has been followed by Dr. Carloyn Manner  HPI  Review of Systems  Constitutional: Negative for fatigue and fever.  HENT: Negative for ear pain.   Eyes: Negative for pain.  Respiratory: Negative for cough and shortness of breath.   Cardiovascular: Negative for leg swelling.  Gastrointestinal: Negative for constipation and diarrhea.  Genitourinary: Negative for difficulty urinating.  Musculoskeletal: Negative for back pain and neck pain.  Skin: Negative for rash.  Allergic/Immunologic: Negative for food allergies.  Neurological: Positive for weakness. Negative for numbness.  Hematological: Does not bruise/bleed easily.  Psychiatric/Behavioral: Positive for sleep disturbance.     Objective: Vital Signs: BP (!) 167/75 (BP Location: Left Arm, Patient Position: Sitting, Cuff Size: Normal)   Pulse 73   Ht 5\' 5"  (1.651 m)   Wt 199 lb (90.3 kg)   BMI 33.12 kg/m   Physical Exam  Constitutional: She is oriented to person, place, and time. She appears well-developed and well-nourished.  HENT:  Mouth/Throat: Oropharynx is clear and moist.  Eyes: Pupils are equal, round, and reactive to light. EOM are normal.  Pulmonary/Chest: Effort normal.  Neurological: She is alert and oriented to person, place, and time.  Skin: Skin is warm and dry.  Psychiatric: She has a normal mood and affect. Her behavior is normal.    Ortho Exam  right knee was not hot red or warm.  May be a small effusion.  Mostly lateral joint pain but some medially.  Little bit of patella crepitation.  Full extension.  Increased valgus with weightbearing.  No popliteal pain or mass.  Wearing support stockings for chronic swelling of both of her ankles and an ankle support right  Specialty Comments:  No specialty comments available.  Imaging: No results found.   PMFS History: Patient Active Problem List   Diagnosis Date Noted  . Bilateral primary osteoarthritis of knee  11/03/2017  . Dysphagia, unspecified(787.20) 06/07/2013  . Unspecified hypothyroidism 10/10/2012  . High cholesterol 10/10/2012  . Essential hypertension, benign 10/10/2012  . Rectal bleeding 10/10/2012  . Pill dysphagia 10/10/2012  . Family hx of colon cancer 10/10/2012  . Anemia 10/10/2012   Past Medical History:  Diagnosis Date  . Cancer [facility])    left breast cancer  . Carotid artery occlusion   . Diabetes mellitus without complication (HCC)    diet controlled  . Family hx of colon cancer   . GERD (gastroesophageal reflux disease)   . High cholesterol   . History of gout   . Hypertension    for over 20 yrs  . Hypothyroid   . Neuropathy   . Pill dysphagia     Family History  Problem Relation Age of Onset  . Colon cancer Brother     Past Surgical History:  Procedure Laterality Date  . BACK SURGERY     5 rods and 5 screws and cage in back, has had 4 back surgeries.  Marland Kitchen BREAST LUMPECTOMY     left breast  . CATARACT EXTRACTION W/PHACO Left 12/31/2014   Procedure: CATARACT EXTRACTION PHACO AND INTRAOCULAR LENS PLACEMENT (IOC);  Surgeon: Tonny Branch, MD;  [facility]: [facility];  Service: Ophthalmology;  Laterality: Left;  CDE:8.45  . CATARACT EXTRACTION W/PHACO Right 01/28/2015   Procedure: CATARACT EXTRACTION PHACO AND INTRAOCULAR LENS PLACEMENT RIGHT EYE;  Surgeon: Tonny Branch, MD;  Location: [facility];  Service: Ophthalmology;  Laterality: Right;  CDE:4.92  . CHOLECYSTECTOMY    . COLONOSCOPY N/A 10/21/2012   Procedure: COLONOSCOPY;  Surgeon: Rogene Houston, MD;  [facility];  Service: Endoscopy;  Laterality: N/A;  . ESOPHAGOGASTRODUODENOSCOPY (EGD) WITH ESOPHAGEAL DILATION N/A 06/09/2013   Procedure: ESOPHAGOGASTRODUODENOSCOPY (EGD) WITH ESOPHAGEAL DILATION;  Surgeon: Rogene Houston, MD;  [facility];  Service: Endoscopy;  Laterality: N/A;  125  . KNEE SURGERY Right    arthroscopy x2  . SPINE SURGERY     3 surgeries by Dr. Jenne Campus,  Last surgery 2  years ago by Dr. Carloyn Manner in [place] History  . Not on file  Tobacco Use  . Smoking status: Never Smoker  . Smokeless tobacco: Never Used  Substance and Sexual Activity  . Alcohol use: No    Alcohol/week: 0.0 standard drinks  . Drug use: No  . Sexual activity: Yes    Birth control/protection: Post-menopausal

## 2018-05-03 DIAGNOSIS — E1122 Type 2 diabetes mellitus with diabetic chronic kidney disease: Secondary | ICD-10-CM | POA: Diagnosis not present

## 2018-05-03 DIAGNOSIS — R609 Edema, unspecified: Secondary | ICD-10-CM | POA: Diagnosis not present

## 2018-05-03 DIAGNOSIS — Z6832 Body mass index (BMI) 32.0-32.9, adult: Secondary | ICD-10-CM | POA: Diagnosis not present

## 2018-05-03 DIAGNOSIS — I10 Essential (primary) hypertension: Secondary | ICD-10-CM | POA: Diagnosis not present

## 2018-05-03 DIAGNOSIS — E559 Vitamin D deficiency, unspecified: Secondary | ICD-10-CM | POA: Diagnosis not present

## 2018-05-03 DIAGNOSIS — N183 Chronic kidney disease, stage 3 (moderate): Secondary | ICD-10-CM | POA: Diagnosis not present

## 2018-05-03 DIAGNOSIS — Z299 Encounter for prophylactic measures, unspecified: Secondary | ICD-10-CM | POA: Diagnosis not present

## 2018-05-04 ENCOUNTER — Ambulatory Visit (INDEPENDENT_AMBULATORY_CARE_PROVIDER_SITE_OTHER): Payer: Medicare Other | Admitting: Orthopaedic Surgery

## 2018-05-04 ENCOUNTER — Encounter (INDEPENDENT_AMBULATORY_CARE_PROVIDER_SITE_OTHER): Payer: Self-pay | Admitting: Orthopaedic Surgery

## 2018-05-04 VITALS — BP 152/72 | HR 69 | Ht 65.0 in | Wt 199.0 lb

## 2018-05-04 DIAGNOSIS — M170 Bilateral primary osteoarthritis of knee: Secondary | ICD-10-CM | POA: Diagnosis not present

## 2018-05-04 DIAGNOSIS — S8265XD Nondisplaced fracture of lateral malleolus of left fibula, subsequent encounter for closed fracture with routine healing: Secondary | ICD-10-CM | POA: Diagnosis not present

## 2018-05-04 DIAGNOSIS — I6523 Occlusion and stenosis of bilateral carotid arteries: Secondary | ICD-10-CM

## 2018-05-04 MED ORDER — BUPIVACAINE HCL 0.5 % IJ SOLN
2.0000 mL | INTRAMUSCULAR | Status: AC | PRN
  Administered 2018-05-04: 2 mL via INTRA_ARTICULAR

## 2018-05-04 MED ORDER — LIDOCAINE HCL 1 % IJ SOLN
2.0000 mL | INTRAMUSCULAR | Status: AC | PRN
  Administered 2018-05-04: 2 mL

## 2018-05-04 MED ORDER — METHYLPREDNISOLONE ACETATE 40 MG/ML IJ SUSP
80.0000 mg | INTRAMUSCULAR | Status: AC | PRN
  Administered 2018-05-04: 80 mg

## 2018-05-04 NOTE — Progress Notes (Signed)
Office Visit Note   Patient: Rhonda Mejia           Date of Birth: January 09, 1929           MRN: [id number] Visit Date: 05/04/2018              Requested by: Monico Blitz, MD [address] [place], [place] [address] PCP: Monico Blitz, MD   Assessment & Plan: Visit Diagnoses:  1. Bilateral primary osteoarthritis of knee     Plan: Lateral knee osteoarthritis.  Had good success with cortisone injection right knee recently would like another injection in the left knee.  Will inject and plan to see her back as needed  Follow-Up Instructions: Return if symptoms worsen or fail to improve.   Orders:  No orders of the defined types were placed in this encounter.  No orders of the defined types were placed in this encounter.     Procedures: Large Joint Inj: L knee on 05/04/2018 1:56 PM Indications: pain and diagnostic evaluation Details: 25 G 1.5 in needle, anteromedial approach  Arthrogram: No  Medications: 2 mL lidocaine 1 %; 2 mL bupivacaine 0.5 %; 80 mg methylPREDNISolone acetate 40 MG/ML Procedure, treatment alternatives, risks and benefits explained, specific risks discussed. Consent was given by the patient. Patient was prepped and draped in the usual sterile fashion.       Clinical Data: No additional findings.   Subjective: No chief complaint on file.  [place] has bilateral knee osteoarthritis.  Had recent cortisone injection in right knee that "helped a lot".  Would like to have an injection in her left knee  HPI  Review of Systems   Objective: Vital Signs: There were no vitals taken for this visit.  Physical Exam  Ortho Exam minimal effusion left knee.  Full extension and flexion over 100 degrees.  No instability.  Multiple small varicosities about her knee but no local tenderness other than the medial lateral joint line  Specialty Comments:  No specialty comments available.  Imaging: No results found.   PMFS History: Patient Active Problem List   Diagnosis Date Noted  . Bilateral primary osteoarthritis of knee 11/03/2017  . Dysphagia, unspecified(787.20) 06/07/2013  . Unspecified hypothyroidism 10/10/2012  . High cholesterol 10/10/2012  . Essential hypertension, benign 10/10/2012  . Rectal bleeding 10/10/2012  . Pill dysphagia 10/10/2012  . Family hx of colon cancer 10/10/2012  . Anemia 10/10/2012   Past Medical History:  Diagnosis Date  . Cancer [facility])    left breast cancer  . Carotid artery occlusion   . Diabetes mellitus without complication (HCC)    diet controlled  . Family hx of colon cancer   . GERD (gastroesophageal reflux disease)   . High cholesterol   . History of gout   . Hypertension    for over 20 yrs  . Hypothyroid   . Neuropathy   . Pill dysphagia     Family History  Problem Relation Age of Onset  . Colon cancer Brother     Past Surgical History:  Procedure Laterality Date  . BACK SURGERY     5 rods and 5 screws and cage in back, has had 4 back surgeries.  Marland Kitchen BREAST LUMPECTOMY     left breast  . CATARACT EXTRACTION W/PHACO Left 12/31/2014   Procedure: CATARACT EXTRACTION PHACO AND INTRAOCULAR LENS PLACEMENT (IOC);  Surgeon: Tonny Branch, MD;  [facility];  Service: Ophthalmology;  Laterality: Left;  CDE:8.45  . CATARACT EXTRACTION W/PHACO Right 01/28/2015  Procedure: CATARACT EXTRACTION PHACO AND INTRAOCULAR LENS PLACEMENT RIGHT EYE;  Surgeon: Tonny Branch, MD;  Location: [facility];  Service: Ophthalmology;  Laterality: Right;  CDE:4.92  . CHOLECYSTECTOMY    . COLONOSCOPY N/A 10/21/2012   Procedure: COLONOSCOPY;  Surgeon: Rogene Houston, MD;  [facility];  Service: Endoscopy;  Laterality: N/A;  . ESOPHAGOGASTRODUODENOSCOPY (EGD) WITH ESOPHAGEAL DILATION N/A 06/09/2013   Procedure: ESOPHAGOGASTRODUODENOSCOPY (EGD) WITH ESOPHAGEAL DILATION;  Surgeon: Rogene Houston, MD;  [facility];  Service: Endoscopy;  Laterality: N/A;  125  . KNEE SURGERY Right    arthroscopy x2  . SPINE  SURGERY     3 surgeries by Dr. Jenne Campus,  Last surgery 2 years ago by Dr. Carloyn Manner in [place] History  . Not on file  Tobacco Use  . Smoking status: Never Smoker  . Smokeless tobacco: Never Used  Substance and Sexual Activity  . Alcohol use: No    Alcohol/week: 0.0 standard drinks  . Drug use: No  . Sexual activity: Yes    Birth control/protection: Post-menopausal     Garald Balding, MD   Note - This record has been created using [employer].  Chart creation errors have been sought, but may not always  have been located. Such creation errors do not reflect on  the standard of medical care.

## 2018-05-05 DIAGNOSIS — E2839 Other primary ovarian failure: Secondary | ICD-10-CM | POA: Diagnosis not present

## 2018-05-16 DIAGNOSIS — E119 Type 2 diabetes mellitus without complications: Secondary | ICD-10-CM | POA: Diagnosis not present

## 2018-05-16 DIAGNOSIS — I10 Essential (primary) hypertension: Secondary | ICD-10-CM | POA: Diagnosis not present

## 2018-05-24 DIAGNOSIS — M5124 Other intervertebral disc displacement, thoracic region: Secondary | ICD-10-CM | POA: Diagnosis not present

## 2018-05-24 DIAGNOSIS — M47816 Spondylosis without myelopathy or radiculopathy, lumbar region: Secondary | ICD-10-CM | POA: Diagnosis not present

## 2018-06-20 DIAGNOSIS — E1140 Type 2 diabetes mellitus with diabetic neuropathy, unspecified: Secondary | ICD-10-CM | POA: Diagnosis not present

## 2018-06-20 DIAGNOSIS — E1151 Type 2 diabetes mellitus with diabetic peripheral angiopathy without gangrene: Secondary | ICD-10-CM | POA: Diagnosis not present

## 2018-06-21 DIAGNOSIS — E119 Type 2 diabetes mellitus without complications: Secondary | ICD-10-CM | POA: Diagnosis not present

## 2018-06-21 DIAGNOSIS — I10 Essential (primary) hypertension: Secondary | ICD-10-CM | POA: Diagnosis not present

## 2018-06-22 DIAGNOSIS — L0100 Impetigo, unspecified: Secondary | ICD-10-CM | POA: Diagnosis not present

## 2018-06-22 DIAGNOSIS — L280 Lichen simplex chronicus: Secondary | ICD-10-CM | POA: Diagnosis not present

## 2018-06-29 ENCOUNTER — Ambulatory Visit (INDEPENDENT_AMBULATORY_CARE_PROVIDER_SITE_OTHER): Payer: Medicare Other | Admitting: Orthopaedic Surgery

## 2018-06-29 ENCOUNTER — Encounter (INDEPENDENT_AMBULATORY_CARE_PROVIDER_SITE_OTHER): Payer: Self-pay | Admitting: Orthopaedic Surgery

## 2018-06-29 VITALS — BP 168/77 | HR 64 | Ht 65.0 in | Wt 207.0 lb

## 2018-06-29 DIAGNOSIS — I6523 Occlusion and stenosis of bilateral carotid arteries: Secondary | ICD-10-CM

## 2018-06-29 DIAGNOSIS — M170 Bilateral primary osteoarthritis of knee: Secondary | ICD-10-CM

## 2018-06-29 DIAGNOSIS — M1712 Unilateral primary osteoarthritis, left knee: Secondary | ICD-10-CM

## 2018-06-29 MED ORDER — METHYLPREDNISOLONE ACETATE 40 MG/ML IJ SUSP
80.0000 mg | INTRAMUSCULAR | Status: AC | PRN
  Administered 2018-06-29: 80 mg

## 2018-06-29 MED ORDER — LIDOCAINE HCL 1 % IJ SOLN
2.0000 mL | INTRAMUSCULAR | Status: AC | PRN
  Administered 2018-06-29: 2 mL

## 2018-06-29 MED ORDER — BUPIVACAINE HCL 0.5 % IJ SOLN
2.0000 mL | INTRAMUSCULAR | Status: AC | PRN
  Administered 2018-06-29: 2 mL via INTRA_ARTICULAR

## 2018-06-29 NOTE — Progress Notes (Signed)
Office Visit Note   Patient: Rhonda Mejia           Date of Birth: 1929-06-14           MRN: [id number] Visit Date: 06/29/2018              Requested by: Monico Blitz, MD [place],  [address] PCP: Monico Blitz, MD   Assessment & Plan: Visit Diagnoses:  1. Bilateral primary osteoarthritis of knee     Plan: Stage osteoarthritis left knee.  Will reinject with cortisone return as needed  Follow-Up Instructions: Return if symptoms worsen or fail to improve.   Orders:  Orders Placed This Encounter  Procedures  . Large Joint Inj: L knee   No orders of the defined types were placed in this encounter.     Procedures: Large Joint Inj: L knee on 06/29/2018 1:22 PM Indications: pain and diagnostic evaluation Details: 25 G 1.5 in needle, anteromedial approach  Arthrogram: No  Medications: 2 mL lidocaine 1 %; 2 mL bupivacaine 0.5 %; 80 mg methylPREDNISolone acetate 40 MG/ML Procedure, treatment alternatives, risks and benefits explained, specific risks discussed. Consent was given by the patient. Patient was prepped and draped in the usual sterile fashion.       Clinical Data: No additional findings.   Subjective: Chief Complaint  Patient presents with  . Left Knee - Pain  Patient returns with left knee pain requesting an injection. She states that she had good relief from the last injection given on 05/04/18.  She ambulates with a cane.  HPI  Review of Systems   Objective: Vital Signs: BP (!) 168/77   Pulse 64   Ht 5\' 5"  (1.651 m)   Wt 207 lb (93.9 kg)   BMI 34.45 kg/m   Physical Exam  Constitutional: She is oriented to person, place, and time. She appears well-developed and well-nourished.  HENT:  Mouth/Throat: Oropharynx is clear and moist.  Eyes: Pupils are equal, round, and reactive to light. EOM are normal.  Pulmonary/Chest: Effort normal.  Neurological: She is alert and oriented to person, place, and time.  Skin: Skin is warm and  dry.  Psychiatric: She has a normal mood and affect. Her behavior is normal.    Ortho Exam left knee with possibly small effusion.  Full extension flexed about 100 degrees without instability.  Mostly medial joint pain today.  Specialty Comments:  No specialty comments available.  Imaging: No results found.   PMFS History: Patient Active Problem List   Diagnosis Date Noted  . Bilateral primary osteoarthritis of knee 11/03/2017  . Dysphagia, unspecified(787.20) 06/07/2013  . Unspecified hypothyroidism 10/10/2012  . High cholesterol 10/10/2012  . Essential hypertension, benign 10/10/2012  . Rectal bleeding 10/10/2012  . Pill dysphagia 10/10/2012  . Family hx of colon cancer 10/10/2012  . Anemia 10/10/2012   Past Medical History:  Diagnosis Date  . Cancer [facility])    left breast cancer  . Carotid artery occlusion   . Diabetes mellitus without complication (HCC)    diet controlled  . Family hx of colon cancer   . GERD (gastroesophageal reflux disease)   . High cholesterol   . History of gout   . Hypertension    for over 20 yrs  . Hypothyroid   . Neuropathy   . Pill dysphagia     Family History  Problem Relation Age of Onset  . Colon cancer Brother     Past Surgical History:  Procedure Laterality Date  .  BACK SURGERY     5 rods and 5 screws and cage in back, has had 4 back surgeries.  Marland Kitchen BREAST LUMPECTOMY     left breast  . CATARACT EXTRACTION W/PHACO Left 12/31/2014   Procedure: CATARACT EXTRACTION PHACO AND INTRAOCULAR LENS PLACEMENT (IOC);  Surgeon: Tonny Branch, MD;  Location: [facility];  Service: Ophthalmology;  Laterality: Left;  CDE:8.45  . CATARACT EXTRACTION W/PHACO Right 01/28/2015   Procedure: CATARACT EXTRACTION PHACO AND INTRAOCULAR LENS PLACEMENT RIGHT EYE;  Surgeon: Tonny Branch, MD;  Location: [facility];  Service: Ophthalmology;  Laterality: Right;  CDE:4.92  . CHOLECYSTECTOMY    . COLONOSCOPY N/A 10/21/2012   Procedure: COLONOSCOPY;  Surgeon: Rogene Houston, MD;   [facility];  Service: Endoscopy;  Laterality: N/A;  . ESOPHAGOGASTRODUODENOSCOPY (EGD) WITH ESOPHAGEAL DILATION N/A 06/09/2013   Procedure: ESOPHAGOGASTRODUODENOSCOPY (EGD) WITH ESOPHAGEAL DILATION;  Surgeon: Rogene Houston, MD;  [facility];  Service: Endoscopy;  Laterality: N/A;  125  . KNEE SURGERY Right    arthroscopy x2  . SPINE SURGERY     3 surgeries by Dr. Jenne Campus,  Last surgery 2 years ago by Dr. Carloyn Manner in [place] History  . Not on file  Tobacco Use  . Smoking status: Never Smoker  . Smokeless tobacco: Never Used  Substance and Sexual Activity  . Alcohol use: No    Alcohol/week: 0.0 standard drinks  . Drug use: No  . Sexual activity: Yes    Birth control/protection: Post-menopausal

## 2018-08-02 DIAGNOSIS — I10 Essential (primary) hypertension: Secondary | ICD-10-CM | POA: Diagnosis not present

## 2018-08-02 DIAGNOSIS — E119 Type 2 diabetes mellitus without complications: Secondary | ICD-10-CM | POA: Diagnosis not present

## 2018-08-03 ENCOUNTER — Ambulatory Visit: Payer: Medicare Other | Admitting: Family

## 2018-08-03 ENCOUNTER — Encounter (HOSPITAL_COMMUNITY): Payer: Medicare Other

## 2018-08-15 ENCOUNTER — Encounter (HOSPITAL_COMMUNITY): Payer: Medicare Other

## 2018-08-15 ENCOUNTER — Ambulatory Visit: Payer: Medicare Other | Admitting: Family

## 2018-08-17 ENCOUNTER — Other Ambulatory Visit: Payer: Self-pay | Admitting: Cardiology

## 2018-08-23 DIAGNOSIS — M5124 Other intervertebral disc displacement, thoracic region: Secondary | ICD-10-CM | POA: Diagnosis not present

## 2018-08-23 DIAGNOSIS — S82892A Other fracture of left lower leg, initial encounter for closed fracture: Secondary | ICD-10-CM | POA: Diagnosis not present

## 2018-09-05 DIAGNOSIS — I10 Essential (primary) hypertension: Secondary | ICD-10-CM | POA: Diagnosis not present

## 2018-09-05 DIAGNOSIS — E119 Type 2 diabetes mellitus without complications: Secondary | ICD-10-CM | POA: Diagnosis not present

## 2018-09-14 ENCOUNTER — Encounter (INDEPENDENT_AMBULATORY_CARE_PROVIDER_SITE_OTHER): Payer: Self-pay | Admitting: Orthopaedic Surgery

## 2018-09-14 ENCOUNTER — Ambulatory Visit (INDEPENDENT_AMBULATORY_CARE_PROVIDER_SITE_OTHER): Payer: Medicare Other | Admitting: Orthopaedic Surgery

## 2018-09-14 VITALS — BP 151/69 | HR 58 | Ht 66.0 in | Wt 203.0 lb

## 2018-09-14 DIAGNOSIS — M170 Bilateral primary osteoarthritis of knee: Secondary | ICD-10-CM | POA: Diagnosis not present

## 2018-09-14 MED ORDER — LIDOCAINE HCL 1 % IJ SOLN
2.0000 mL | INTRAMUSCULAR | Status: AC | PRN
  Administered 2018-09-14: 2 mL

## 2018-09-14 MED ORDER — METHYLPREDNISOLONE ACETATE 40 MG/ML IJ SUSP
80.0000 mg | INTRAMUSCULAR | Status: AC | PRN
  Administered 2018-09-14: 80 mg via INTRA_ARTICULAR

## 2018-09-14 MED ORDER — BUPIVACAINE HCL 0.5 % IJ SOLN
2.0000 mL | INTRAMUSCULAR | Status: AC | PRN
  Administered 2018-09-14: 2 mL via INTRA_ARTICULAR

## 2018-09-14 NOTE — Progress Notes (Signed)
Office Visit Note   Patient: Rhonda Mejia           Date of Birth: Aug 17, 1928           MRN: [id number] Visit Date: 09/14/2018              Requested by: Monico Blitz, MD [address] [place], [place] [address] PCP: Monico Blitz, MD   Assessment & Plan: Visit Diagnoses:  1. Bilateral primary osteoarthritis of knee     Plan: Bilateral knee osteoarthritis.  Presently more symptomatic on the left.  Approximately 2-1/2 months since her last injection.  I will repeat the cortisone injection.  Has not had good relief with Visco supplementation in the past.  Not sure at this point she is a candidate for knee replacement  Follow-Up Instructions: Return in about 2 weeks (around 09/28/2018).   Orders:  Orders Placed This Encounter  Procedures  . Large Joint Inj: L knee   No orders of the defined types were placed in this encounter.     Procedures: Large Joint Inj: L knee on 09/14/2018 2:43 PM Indications: pain and diagnostic evaluation Details: 25 G 1.5 in needle, anteromedial approach  Arthrogram: No  Medications: 2 mL lidocaine 1 %; 2 mL bupivacaine 0.5 %; 80 mg methylPREDNISolone acetate 40 MG/ML Procedure, treatment alternatives, risks and benefits explained, specific risks discussed. Consent was given by the patient. Patient was prepped and draped in the usual sterile fashion.       Clinical Data: No additional findings.   Subjective: Chief Complaint  Patient presents with  . Left Knee - Pain  . Right Knee - Pain  Patient presents today for follow up on her knees. She was here two months ago and received a cortisone inection on the left side on 06/29/18. She said that both knees are hurting again. The left is worse than the right. She is taking tramadol for pain and hoping to get cortisone injections today.   HPI  Review of Systems   Objective: Vital Signs: BP (!) 151/69   Pulse (!) 58   Ht 5\' 6"  (1.676 m)   Wt 203 lb (92.1 kg)   BMI 32.77 kg/m   Physical  Exam Constitutional:      Appearance: She is well-developed.  Eyes:     Pupils: Pupils are equal, round, and reactive to light.  Pulmonary:     Effort: Pulmonary effort is normal.  Skin:    General: Skin is warm and dry.  Neurological:     Mental Status: She is alert and oriented to person, place, and time.  Psychiatric:        Behavior: Behavior normal.     Ortho Exam awake alert and oriented x3.  Comfortable sitting.  Very minimal effusion left knee.  Predominately medial joint pain.  Some patellar crepitation.  No instability.  Does have some chronic swelling of both of her ankles and wears support stockings.  Specialty Comments:  No specialty comments available.  Imaging: No results found.   PMFS History: Patient Active Problem List   Diagnosis Date Noted  . Bilateral primary osteoarthritis of knee 11/03/2017  . Dysphagia, unspecified(787.20) 06/07/2013  . Unspecified hypothyroidism 10/10/2012  . High cholesterol 10/10/2012  . Essential hypertension, benign 10/10/2012  . Rectal bleeding 10/10/2012  . Pill dysphagia 10/10/2012  . Family hx of colon cancer 10/10/2012  . Anemia 10/10/2012   Past Medical History:  Diagnosis Date  . Cancer [facility])    left breast cancer  .  Carotid artery occlusion   . Diabetes mellitus without complication (HCC)    diet controlled  . Family hx of colon cancer   . GERD (gastroesophageal reflux disease)   . High cholesterol   . History of gout   . Hypertension    for over 20 yrs  . Hypothyroid   . Neuropathy   . Pill dysphagia     Family History  Problem Relation Age of Onset  . Colon cancer Brother     Past Surgical History:  Procedure Laterality Date  . BACK SURGERY     5 rods and 5 screws and cage in back, has had 4 back surgeries.  Marland Kitchen BREAST LUMPECTOMY     left breast  . CATARACT EXTRACTION W/PHACO Left 12/31/2014   Procedure: CATARACT EXTRACTION PHACO AND INTRAOCULAR LENS PLACEMENT (IOC);  Surgeon: Tonny Branch, MD;   Location: [facility];  Service: Ophthalmology;  Laterality: Left;  CDE:8.45  . CATARACT EXTRACTION W/PHACO Right 01/28/2015   Procedure: CATARACT EXTRACTION PHACO AND INTRAOCULAR LENS PLACEMENT RIGHT EYE;  Surgeon: Tonny Branch, MD;  Location: [facility];  Service: Ophthalmology;  Laterality: Right;  CDE:4.92  . CHOLECYSTECTOMY    . COLONOSCOPY N/A 10/21/2012   Procedure: COLONOSCOPY;  Surgeon: Rogene Houston, MD;  Location: [facility];  Service: Endoscopy;  Laterality: N/A;  . ESOPHAGOGASTRODUODENOSCOPY (EGD) WITH ESOPHAGEAL DILATION N/A 06/09/2013   Procedure: ESOPHAGOGASTRODUODENOSCOPY (EGD) WITH ESOPHAGEAL DILATION;  Surgeon: Rogene Houston, MD;  [facility]: [facility];  Service: Endoscopy;  Laterality: N/A;  125  . KNEE SURGERY Right    arthroscopy x2  . SPINE SURGERY     3 surgeries by Dr. Jenne Campus,  Last surgery 2 years ago by Dr. Carloyn Manner in [place] History  . Not on file  Tobacco Use  . Smoking status: Never Smoker  . Smokeless tobacco: Never Used  Substance and Sexual Activity  . Alcohol use: No    Alcohol/week: 0.0 standard drinks  . Drug use: No  . Sexual activity: Yes    Birth control/protection: Post-menopausal

## 2018-09-15 ENCOUNTER — Other Ambulatory Visit: Payer: Self-pay

## 2018-09-15 DIAGNOSIS — I6523 Occlusion and stenosis of bilateral carotid arteries: Secondary | ICD-10-CM

## 2018-09-15 DIAGNOSIS — I872 Venous insufficiency (chronic) (peripheral): Secondary | ICD-10-CM

## 2018-09-26 ENCOUNTER — Ambulatory Visit (INDEPENDENT_AMBULATORY_CARE_PROVIDER_SITE_OTHER): Payer: Medicare Other | Admitting: Vascular Surgery

## 2018-09-26 ENCOUNTER — Encounter: Payer: Self-pay | Admitting: Vascular Surgery

## 2018-09-26 ENCOUNTER — Ambulatory Visit (HOSPITAL_COMMUNITY)
Admission: RE | Admit: 2018-09-26 | Discharge: 2018-09-26 | Disposition: A | Discharge status: Home / Self Care | Payer: Medicare Other | Source: Ambulatory Visit | Attending: Vascular Surgery | Admitting: Vascular Surgery

## 2018-09-26 VITALS — BP 186/94 | HR 82 | Temp 97.7°F | Resp 18 | Wt 198.2 lb

## 2018-09-26 DIAGNOSIS — I6523 Occlusion and stenosis of bilateral carotid arteries: Secondary | ICD-10-CM | POA: Insufficient documentation

## 2018-09-26 NOTE — Progress Notes (Signed)
Vascular and Vein Specialist of [facility] office  Patient name: Rhonda Mejia MRN: [id number] DOB: 05/15/1929 Sex: female  REASON FOR VISIT: Follow-up extracranial cerebrovascular occlusive disease  HPI: Rhonda Mejia is a 85 y.o. female for follow-up.  She remains completely asymptomatic from a carotid standpoint.  She has had evaluation in the past from carotid bruit.  Former duplex and CT angiogram have suggested moderate to severe left common carotid artery stenosis.  She had seen Dr. Scot Dock in our office and is been seen there for ultrasound follow-up.  She has very difficult time with transportation to [place] and therefore is seeing me in [place] today.  No change in her medical history.  She turned 89 earlier this week.  Past Medical History:  Diagnosis Date  . Cancer [facility])    left breast cancer  . Carotid artery occlusion   . Diabetes mellitus without complication (HCC)    diet controlled  . Family hx of colon cancer   . GERD (gastroesophageal reflux disease)   . High cholesterol   . History of gout   . Hypertension    for over 20 yrs  . Hypothyroid   . Neuropathy   . Pill dysphagia     Family History  Problem Relation Age of Onset  . Colon cancer Brother     SOCIAL HISTORY: Social History   Tobacco Use  . Smoking status: Never Smoker  . Smokeless tobacco: Never Used  Substance Use Topics  . Alcohol use: No    Alcohol/week: 0.0 standard drinks    Allergies  Allergen Reactions  . Lipitor [Atorvastatin] Other (See Comments)    Didn't agree with stomach  . Penicillins     Rash   . Sulfa Antibiotics     Unknown reaction     Current Outpatient Medications  Medication Sig Dispense Refill  . allopurinol (ZYLOPRIM) 300 MG tablet Take 300 mg by mouth daily.    . AMITIZA 24 MCG capsule Take 24 mcg by mouth daily as needed.   1  . aspirin EC 81 MG tablet Take 81 mg by mouth daily.    .  Cyanocobalamin (B-12) 2500 MCG TABS Take 2 tablets by mouth daily.     . folic acid (FOLVITE) 1 MG tablet Take 1 mg by mouth daily.    . furosemide (LASIX) 40 MG tablet Take 1.5 tablets (60 mg total) by mouth daily. 135 tablet 3  . gabapentin (NEURONTIN) 300 MG capsule Take 300 mg by mouth 2 (two) times daily.  2  . hydrALAZINE (APRESOLINE) 50 MG tablet TAKE ONE TABLET BY MOUTH THREE TIMES DAILY. (MORNING ,NOON ,EVENING) 270 tablet 1  . hydrocortisone 2.5 % ointment APPLY A SMALL AMOUNT TO ITCHY PATCHES ON FACE TWICE DAILY  0  . ibuprofen (ADVIL,MOTRIN) 800 MG tablet TAKE ONE TABLET BY MOUTH EVERY 8 HOURS AS NEEDED WITH FOOD  0  . levothyroxine (SYNTHROID, LEVOTHROID) 100 MCG tablet Take 100 mcg by mouth daily.    Marland Kitchen lisinopril (PRINIVIL,ZESTRIL) 40 MG tablet Take 40 mg by mouth daily.    . metoprolol succinate (TOPROL-XL) 50 MG 24 hr tablet Take 50 mg by mouth daily.  6  . Omega-3 Fatty Acids (FISH OIL PO) Take 1 capsule by mouth daily.    Marland Kitchen omeprazole (PRILOSEC) 40 MG capsule Take 40 mg by mouth daily.    Marland Kitchen pyridOXINE (VITAMIN B-6) 100 MG tablet Take 100 mg by mouth daily.    . traMADol (ULTRAM) 50  MG tablet Take 50 mg by mouth every 6 (six) hours as needed for pain.    Marland Kitchen triamcinolone cream (KENALOG) 0.1 % APPLY TO ITCHY SKIN DAILY (DO NOT USE ON FACE)  1   No current facility-administered medications for this visit.     REVIEW OF SYSTEMS:  [X]  denotes positive finding, [ ]  denotes negative finding Cardiac  Comments:  Chest pain or chest pressure:    Shortness of breath upon exertion:    Short of breath when lying flat:    Irregular heart rhythm:        Vascular    Pain in calf, thigh, or hip brought on by ambulation:    Pain in feet at night that wakes you up from your sleep:  x   Blood clot in your veins:    Leg swelling:           PHYSICAL EXAM: Vitals:   09/26/18 1455  BP: (!) 186/94  Pulse: 82  Resp: 18  Temp: 97.7 F (36.5 C)  TempSrc: Temporal  Weight: 198 lb 3.2  oz (89.9 kg)    GENERAL: The patient is a well-nourished female, in no acute distress. The vital signs are documented above. CARDIOVASCULAR: Bilateral carotid bruits are present.  2+ radial pulses bilaterally PULMONARY: There is good air exchange  MUSCULOSKELETAL: There are no major deformities or cyanosis. NEUROLOGIC: No focal weakness or paresthesias are detected. SKIN: There are no ulcers or rashes noted. PSYCHIATRIC: The patient has a normal affect.  DATA:  Carotid duplex suggests common carotid artery stenosis bilaterally no significant change from prior imaging  MEDICAL ISSUES: I had a long discussion with patient regarding this.  She remains completely asymptomatic.  I did explain the option of continued antiplatelet maximal medical treatment with aspirin.  She does have stenosis in her common carotid which would be less risky than internal carotid stenosis.  She agrees with this conservative treatment and does not wish to consider carotid endarterectomy.  I would recommend that we see her in 1 year with CT angiogram to be able to compare to her study from 2017.  I again reviewed symptoms of carotid disease with her and she knows to call 911 and present to the hospital immediately should this occur.  Otherwise we will see her in 1 year for follow-up    Rosetta Posner, MD [facility] Vascular and Vein Specialists of [facility] Tel [phone] Pager [phone]

## 2018-09-27 DIAGNOSIS — E119 Type 2 diabetes mellitus without complications: Secondary | ICD-10-CM | POA: Diagnosis not present

## 2018-09-27 DIAGNOSIS — I10 Essential (primary) hypertension: Secondary | ICD-10-CM | POA: Diagnosis not present

## 2018-09-28 DIAGNOSIS — M5124 Other intervertebral disc displacement, thoracic region: Secondary | ICD-10-CM | POA: Diagnosis not present

## 2018-09-28 DIAGNOSIS — M47816 Spondylosis without myelopathy or radiculopathy, lumbar region: Secondary | ICD-10-CM | POA: Diagnosis not present

## 2018-10-05 ENCOUNTER — Ambulatory Visit (INDEPENDENT_AMBULATORY_CARE_PROVIDER_SITE_OTHER): Payer: Medicare Other | Admitting: Orthopaedic Surgery

## 2018-10-05 ENCOUNTER — Other Ambulatory Visit: Payer: Self-pay

## 2018-10-06 ENCOUNTER — Ambulatory Visit: Payer: Medicare Other | Admitting: Cardiology

## 2018-10-19 ENCOUNTER — Ambulatory Visit (INDEPENDENT_AMBULATORY_CARE_PROVIDER_SITE_OTHER): Payer: Medicare Other | Admitting: Orthopaedic Surgery

## 2018-10-19 ENCOUNTER — Other Ambulatory Visit: Payer: Self-pay

## 2018-10-19 ENCOUNTER — Encounter (INDEPENDENT_AMBULATORY_CARE_PROVIDER_SITE_OTHER): Payer: Self-pay | Admitting: Orthopaedic Surgery

## 2018-10-19 DIAGNOSIS — I6523 Occlusion and stenosis of bilateral carotid arteries: Secondary | ICD-10-CM | POA: Diagnosis not present

## 2018-10-19 DIAGNOSIS — M170 Bilateral primary osteoarthritis of knee: Secondary | ICD-10-CM

## 2018-10-19 MED ORDER — LIDOCAINE HCL 1 % IJ SOLN
2.0000 mL | INTRAMUSCULAR | Status: AC | PRN
  Administered 2018-10-19: 11:00:00 2 mL

## 2018-10-19 MED ORDER — BUPIVACAINE HCL 0.5 % IJ SOLN
2.0000 mL | INTRAMUSCULAR | Status: AC | PRN
  Administered 2018-10-19: 11:00:00 2 mL via INTRA_ARTICULAR

## 2018-10-19 MED ORDER — METHYLPREDNISOLONE ACETATE 40 MG/ML IJ SUSP
80.0000 mg | INTRAMUSCULAR | Status: AC | PRN
  Administered 2018-10-19: 11:00:00 80 mg via INTRA_ARTICULAR

## 2018-10-19 NOTE — Progress Notes (Signed)
Office Visit Note   Patient: Rhonda Mejia           Date of Birth: Apr 09, 1929           MRN: [id number] Visit Date: 10/19/2018              Requested by: Monico Blitz, MD [address] [place],  [address] PCP: Monico Blitz, MD   Assessment & Plan: Visit Diagnoses:  1. Bilateral primary osteoarthritis of knee     Plan: Right knee is more symptomatic today will inject with cortisone.  Has multiple areas of trigger point tenderness.  I wonder if some of her symptoms are not consistent with fibromyalgia  Follow-Up Instructions: Return if symptoms worsen or fail to improve.   Orders:  Orders Placed This Encounter  Procedures  . Large Joint Inj: R knee   No orders of the defined types were placed in this encounter.     Procedures: Large Joint Inj: R knee on 10/19/2018 10:56 AM Indications: pain and diagnostic evaluation Details: 25 G 1.5 in needle, anteromedial approach  Arthrogram: No  Medications: 2 mL lidocaine 1 %; 2 mL bupivacaine 0.5 %; 80 mg methylPREDNISolone acetate 40 MG/ML Procedure, treatment alternatives, risks and benefits explained, specific risks discussed. Consent was given by the patient. Immediately prior to procedure a time out was called to verify the correct patient, procedure, equipment, support staff and site/side marked as required. Patient was prepped and draped in the usual sterile fashion.       Clinical Data: No additional findings.   Subjective: Chief Complaint  Patient presents with  . Right Knee - Follow-up  . Left Knee - Follow-up  Patient presents today for follow up on her bilateral knee arthritis. She had a left knee cortisone injection on 09/14/18. Patient states that both knees are hurting. She is wanting to get an injection in her right knee today. Prior films of both knees demonstrate more arthritis in the right than the left knee.  In the right knee there was considerable loss of the lateral joint space with subchondral  sclerosis and lateral osteophytes.  There was evidence of ectopic calcification both knees consistent with CPPD.  HPI  Review of Systems   Objective: Vital Signs: There were no vitals taken for this visit.  Physical Exam Constitutional:      Appearance: She is well-developed.  Eyes:     Pupils: Pupils are equal, round, and reactive to light.  Pulmonary:     Effort: Pulmonary effort is normal.  Skin:    General: Skin is warm and dry.  Neurological:     Mental Status: She is alert and oriented to person, place, and time.  Psychiatric:        Behavior: Behavior normal.     Ortho Exam awake alert and oriented x3.  Comfortable sitting.  Right knee had multiple areas of tenderness but no increased heat erythema or ecchymosis.  More pain medially than laterally.  Some patellar crepitation.  Full extension to probably 100 degrees of flexion without instability.  No distal edema.  Specialty Comments:  No specialty comments available.  Imaging: No results found.   PMFS History: Patient Active Problem List   Diagnosis Date Noted  . Bilateral primary osteoarthritis of knee 11/03/2017  . Dysphagia, unspecified(787.20) 06/07/2013  . Unspecified hypothyroidism 10/10/2012  . High cholesterol 10/10/2012  . Essential hypertension, benign 10/10/2012  . Rectal bleeding 10/10/2012  . Pill dysphagia 10/10/2012  . Family hx of colon cancer  10/10/2012  . Anemia 10/10/2012   Past Medical History:  Diagnosis Date  . Cancer [facility])    left breast cancer  . Carotid artery occlusion   . Diabetes mellitus without complication (HCC)    diet controlled  . Family hx of colon cancer   . GERD (gastroesophageal reflux disease)   . High cholesterol   . History of gout   . Hypertension    for over 20 yrs  . Hypothyroid   . Neuropathy   . Pill dysphagia     Family History  Problem Relation Age of Onset  . Colon cancer Brother     Past Surgical History:  Procedure Laterality Date  . BACK  SURGERY     5 rods and 5 screws and cage in back, has had 4 back surgeries.  Marland Kitchen BREAST LUMPECTOMY     left breast  . CATARACT EXTRACTION W/PHACO Left 12/31/2014   Procedure: CATARACT EXTRACTION PHACO AND INTRAOCULAR LENS PLACEMENT (IOC);  Surgeon: Tonny Branch, MD;  Location: [facility];  Service: Ophthalmology;  Laterality: Left;  CDE:8.45  . CATARACT EXTRACTION W/PHACO Right 01/28/2015   Procedure: CATARACT EXTRACTION PHACO AND INTRAOCULAR LENS PLACEMENT RIGHT EYE;  Surgeon: Tonny Branch, MD;  Location: [facility];  Service: Ophthalmology;  Laterality: Right;  CDE:4.92  . CHOLECYSTECTOMY    . COLONOSCOPY N/A 10/21/2012   Procedure: COLONOSCOPY;  Surgeon: Rogene Houston, MD;  [facility];  Service: Endoscopy;  Laterality: N/A;  . ESOPHAGOGASTRODUODENOSCOPY (EGD) WITH ESOPHAGEAL DILATION N/A 06/09/2013   Procedure: ESOPHAGOGASTRODUODENOSCOPY (EGD) WITH ESOPHAGEAL DILATION;  Surgeon: Rogene Houston, MD;  [facility]: [facility];  Service: Endoscopy;  Laterality: N/A;  125  . KNEE SURGERY Right    arthroscopy x2  . SPINE SURGERY     3 surgeries by Dr. Jenne Campus,  Last surgery 2 years ago by Dr. Carloyn Manner in [place] History  . Not on file  Tobacco Use  . Smoking status: Never Smoker  . Smokeless tobacco: Never Used  Substance and Sexual Activity  . Alcohol use: No    Alcohol/week: 0.0 standard drinks  . Drug use: No  . Sexual activity: Yes    Birth control/protection: Post-menopausal

## 2018-10-28 DIAGNOSIS — I10 Essential (primary) hypertension: Secondary | ICD-10-CM | POA: Diagnosis not present

## 2018-10-28 DIAGNOSIS — E119 Type 2 diabetes mellitus without complications: Secondary | ICD-10-CM | POA: Diagnosis not present

## 2018-11-16 ENCOUNTER — Encounter (INDEPENDENT_AMBULATORY_CARE_PROVIDER_SITE_OTHER): Payer: Self-pay | Admitting: Orthopaedic Surgery

## 2018-11-16 ENCOUNTER — Other Ambulatory Visit: Payer: Self-pay

## 2018-11-16 ENCOUNTER — Ambulatory Visit (INDEPENDENT_AMBULATORY_CARE_PROVIDER_SITE_OTHER): Payer: Medicare Other | Admitting: Orthopaedic Surgery

## 2018-11-16 VITALS — Ht 66.0 in | Wt 198.0 lb

## 2018-11-16 DIAGNOSIS — I6523 Occlusion and stenosis of bilateral carotid arteries: Secondary | ICD-10-CM

## 2018-11-16 DIAGNOSIS — M170 Bilateral primary osteoarthritis of knee: Secondary | ICD-10-CM

## 2018-11-16 MED ORDER — METHYLPREDNISOLONE ACETATE 40 MG/ML IJ SUSP
80.0000 mg | INTRAMUSCULAR | Status: AC | PRN
  Administered 2018-11-16: 80 mg via INTRA_ARTICULAR

## 2018-11-16 MED ORDER — BUPIVACAINE HCL 0.5 % IJ SOLN
2.0000 mL | INTRAMUSCULAR | Status: AC | PRN
  Administered 2018-11-16: 2 mL via INTRA_ARTICULAR

## 2018-11-16 MED ORDER — LIDOCAINE HCL 1 % IJ SOLN
2.0000 mL | INTRAMUSCULAR | Status: AC | PRN
  Administered 2018-11-16: 2 mL

## 2018-11-16 NOTE — Progress Notes (Signed)
Office Visit Note   Patient: Rhonda Mejia           Date of Birth: 1929/07/03           MRN: [id number] Visit Date: 11/16/2018              Requested by: Monico Blitz, MD [address] [place], [place] [address] PCP: Monico Blitz, MD   Assessment & Plan: Visit Diagnoses:  1. Bilateral primary osteoarthritis of knee     Plan: Repeat cortisone injection left knee.  Return as needed Check with insurance company regarding repeat Visco supplementation Follow-Up Instructions: Return if symptoms worsen or fail to improve.   Orders:  Orders Placed This Encounter  Procedures  . Large Joint Inj: L knee   No orders of the defined types were placed in this encounter.     Procedures: Large Joint Inj: L knee on 11/16/2018 11:48 AM Indications: pain and diagnostic evaluation Details: 25 G 1.5 in needle, anteromedial approach  Arthrogram: No  Medications: 2 mL lidocaine 1 %; 2 mL bupivacaine 0.5 %; 80 mg methylPREDNISolone acetate 40 MG/ML Procedure, treatment alternatives, risks and benefits explained, specific risks discussed. Consent was given by the patient. Patient was prepped and draped in the usual sterile fashion.       Clinical Data: No additional findings.   Subjective: Chief Complaint  Patient presents with  . Left Knee - Pain  Patient presents today for recurrent left knee pain. The pain is down the lateral side of her knee and proximal lower leg. She is requesting a cortisone injection today. She had her last injection on the left side on 09/14/18. She wants to talk about visco injections today.   HPI  Review of Systems   Objective: Vital Signs: Ht 5\' 6"  (1.676 m)   Wt 198 lb (89.8 kg)   BMI 31.96 kg/m   Physical Exam Constitutional:      Appearance: She is well-developed.  Eyes:     Pupils: Pupils are equal, round, and reactive to light.  Pulmonary:     Effort: Pulmonary effort is normal.  Skin:    General: Skin is warm and dry.  Neurological:   Mental Status: She is alert and oriented to person, place, and time.  Psychiatric:        Behavior: Behavior normal.     Ortho Exam left knee with slight valgus.  Knee was not hot warm red or swollen.  No effusion or instability.  Slight lateral joint pain mild patellar crepitation  Specialty Comments:  No specialty comments available.  Imaging: No results found.   PMFS History: Patient Active Problem List   Diagnosis Date Noted  . Bilateral primary osteoarthritis of knee 11/03/2017  . Dysphagia, unspecified(787.20) 06/07/2013  . Unspecified hypothyroidism 10/10/2012  . High cholesterol 10/10/2012  . Essential hypertension, benign 10/10/2012  . Rectal bleeding 10/10/2012  . Pill dysphagia 10/10/2012  . Family hx of colon cancer 10/10/2012  . Anemia 10/10/2012   Past Medical History:  Diagnosis Date  . Cancer [facility])    left breast cancer  . Carotid artery occlusion   . Diabetes mellitus without complication (HCC)    diet controlled  . Family hx of colon cancer   . GERD (gastroesophageal reflux disease)   . High cholesterol   . History of gout   . Hypertension    for over 20 yrs  . Hypothyroid   . Neuropathy   . Pill dysphagia     Family History  Problem Relation Age of Onset  . Colon cancer Brother     Past Surgical History:  Procedure Laterality Date  . BACK SURGERY     5 rods and 5 screws and cage in back, has had 4 back surgeries.  Marland Kitchen BREAST LUMPECTOMY     left breast  . CATARACT EXTRACTION W/PHACO Left 12/31/2014   Procedure: CATARACT EXTRACTION PHACO AND INTRAOCULAR LENS PLACEMENT (IOC);  Surgeon: Tonny Branch, MD;  Location: [facility];  Service: Ophthalmology;  Laterality: Left;  CDE:8.45  . CATARACT EXTRACTION W/PHACO Right 01/28/2015   Procedure: CATARACT EXTRACTION PHACO AND INTRAOCULAR LENS PLACEMENT RIGHT EYE;  Surgeon: Tonny Branch, MD;  Location: [facility];  Service: Ophthalmology;  Laterality: Right;  CDE:4.92  . CHOLECYSTECTOMY    . COLONOSCOPY N/A 10/21/2012    Procedure: COLONOSCOPY;  Surgeon: Rogene Houston, MD;  [facility];  Service: Endoscopy;  Laterality: N/A;  . ESOPHAGOGASTRODUODENOSCOPY (EGD) WITH ESOPHAGEAL DILATION N/A 06/09/2013   Procedure: ESOPHAGOGASTRODUODENOSCOPY (EGD) WITH ESOPHAGEAL DILATION;  Surgeon: Rogene Houston, MD;  [facility]: [facility];  Service: Endoscopy;  Laterality: N/A;  125  . KNEE SURGERY Right    arthroscopy x2  . SPINE SURGERY     3 surgeries by Dr. Jenne Campus,  Last surgery 2 years ago by Dr. Carloyn Manner in [place] History  . Not on file  Tobacco Use  . Smoking status: Never Smoker  . Smokeless tobacco: Never Used  Substance and Sexual Activity  . Alcohol use: No    Alcohol/week: 0.0 standard drinks  . Drug use: No  . Sexual activity: Yes    Birth control/protection: Post-menopausal

## 2018-11-22 DIAGNOSIS — I10 Essential (primary) hypertension: Secondary | ICD-10-CM | POA: Diagnosis not present

## 2018-11-22 DIAGNOSIS — E1165 Type 2 diabetes mellitus with hyperglycemia: Secondary | ICD-10-CM | POA: Diagnosis not present

## 2018-11-22 DIAGNOSIS — Z6832 Body mass index (BMI) 32.0-32.9, adult: Secondary | ICD-10-CM | POA: Diagnosis not present

## 2018-11-22 DIAGNOSIS — Z789 Other specified health status: Secondary | ICD-10-CM | POA: Diagnosis not present

## 2018-11-22 DIAGNOSIS — E7800 Pure hypercholesterolemia, unspecified: Secondary | ICD-10-CM | POA: Diagnosis not present

## 2018-11-22 DIAGNOSIS — M545 Low back pain: Secondary | ICD-10-CM | POA: Diagnosis not present

## 2018-11-22 DIAGNOSIS — Z299 Encounter for prophylactic measures, unspecified: Secondary | ICD-10-CM | POA: Diagnosis not present

## 2018-12-06 ENCOUNTER — Telehealth: Payer: Self-pay | Admitting: *Deleted

## 2018-12-06 NOTE — Telephone Encounter (Signed)
Patient verbally consented for tele-health visits with [facility] and understands that her insurance company will be billed for the encounter.  Aware to have vitals available   

## 2018-12-07 ENCOUNTER — Encounter: Payer: Self-pay | Admitting: Cardiology

## 2018-12-07 ENCOUNTER — Telehealth (INDEPENDENT_AMBULATORY_CARE_PROVIDER_SITE_OTHER): Payer: Medicare Other | Admitting: Cardiology

## 2018-12-07 VITALS — BP 182/74 | HR 63 | Ht 66.0 in | Wt 188.0 lb

## 2018-12-07 DIAGNOSIS — I10 Essential (primary) hypertension: Secondary | ICD-10-CM | POA: Diagnosis not present

## 2018-12-07 DIAGNOSIS — R600 Localized edema: Secondary | ICD-10-CM

## 2018-12-07 MED ORDER — HYDRALAZINE HCL 100 MG PO TABS: 100.0000 mg | ORAL_TABLET | Freq: Three times a day (TID) | ORAL | 1 refills | Status: DC

## 2018-12-07 NOTE — Progress Notes (Signed)
Virtual Visit via Telephone Note   This visit type was conducted due to national recommendations for restrictions regarding the COVID-19 Pandemic (e.g. social distancing) in an effort to limit this patient's exposure and mitigate transmission in our community.  Due to her co-morbid illnesses, this patient is at least at moderate risk for complications without adequate follow up.  This format is felt to be most appropriate for this patient at this time.  The patient did not have access to video technology/had technical difficulties with video requiring transitioning to audio format only (telephone).  All issues noted in this document were discussed and addressed.  No physical exam could be performed with this format.  Please refer to the patient's chart for her  consent to telehealth for [facility].   Date:  12/07/2018   ID:  Rhonda Mejia, DOB [birth year], MRN [id number]  Patient Location: Home Provider Location: Office  PCP:  Monico Blitz, MD  Cardiologist:  Carlyle Dolly, MD  Electrophysiologist:  None   Evaluation Performed:  Follow-Up Visit  Chief Complaint:  Leg edema  History of Present Illness:    Rhonda Mejia is a 85 y.o. female seen today for follow up of the following medical problems.  1. HTN  - checking bp regularly, up and down as high as SBP 130s-180s.  - compliant with meds - norvasc stopped previously, suspected contributed to her LE edema.  2. LE edema - echo 02/2015 difficult study ,with LVEF 50-55%, normal RV function. Diastolic function not described.  - norvasc stopped  due to LE edema. Lasix increased to 40mg  daily. Edema has improved  - repeat echo 09/2015 LVEF 60-65%, abnormal diastolic function grade indeterminate   - followed by vascular for chronic venous insufficiency -unable to wear compression stockings due to ankle fracture  - some recent LE edema - taking lasix 40mg  x 4 days, with some improvement.      3. Carotid  stenosis - carotid US 09/2015 with concern for distal left CCA stenosis, 1-39% right ICA stenosis.  - 11/2015 CTA neck with 31% LICA stenosis, focal contained dissecion left ICA, no significant RICA disease - followed by vascular. From there consultation CTA results thought to be misleading due to heavy calficications and fairly mild velocities by carotid US.    4. Back pain - followed by Dr Carloyn Manner  5. DM2 -she isdiet controlled, followed by pcp  6. Right angle fracture - has finally healed.      The patient does not have symptoms concerning for COVID-19 infection (fever, chills, cough, or new shortness of breath).    Past Medical History:  Diagnosis Date  . Cancer [facility])    left breast cancer  . Carotid artery occlusion   . Diabetes mellitus without complication (HCC)    diet controlled  . Family hx of colon cancer   . GERD (gastroesophageal reflux disease)   . High cholesterol   . History of gout   . Hypertension    for over 20 yrs  . Hypothyroid   . Neuropathy   . Pill dysphagia    Past Surgical History:  Procedure Laterality Date  . BACK SURGERY     5 rods and 5 screws and cage in back, has had 4 back surgeries.  Marland Kitchen BREAST LUMPECTOMY     left breast  . CATARACT EXTRACTION W/PHACO Left 12/31/2014   Procedure: CATARACT EXTRACTION PHACO AND INTRAOCULAR LENS PLACEMENT (IOC);  Surgeon: Tonny Trenika Hudson, MD;  Location: [facility];  Service: Ophthalmology;  Laterality: Left;  CDE:8.45  . CATARACT EXTRACTION W/PHACO Right 01/28/2015   Procedure: CATARACT EXTRACTION PHACO AND INTRAOCULAR LENS PLACEMENT RIGHT EYE;  Surgeon: Tonny Lavance Beazer, MD;  Location: [facility] OR[facility];  Service: Ophthalmology;  Laterality: Right;  CDE:4.92  . CHOLECYSTECTOMY    . COLONOSCOPY N/A 10/21/2012   Procedure: COLONOSCOPY;  Surgeon: Rogene Houston, MD;  Location: [facility];  Service: Endoscopy;  Laterality: N/A;  . ESOPHAGOGASTRODUODENOSCOPY (EGD) WITH ESOPHAGEAL DILATION N/A 06/09/2013   Procedure:  ESOPHAGOGASTRODUODENOSCOPY (EGD) WITH ESOPHAGEAL DILATION;  Surgeon: Rogene Houston, MD;  [facility];  Service: Endoscopy;  Laterality: N/A;  125  . KNEE SURGERY Right    arthroscopy x2  . SPINE SURGERY     3 surgeries by Dr. Jenne Campus,  Last surgery 2 years ago by Dr. Carloyn Manner in [place]     No outpatient medications have been marked as taking for the 12/07/18 encounter (Appointment) with Arnoldo Lenis, MD.     Allergies:   Lipitor [atorvastatin]; Penicillins; and Sulfa antibiotics   Social History   Tobacco Use  . Smoking status: Never Smoker  . Smokeless tobacco: Never Used  Substance Use Topics  . Alcohol use: No    Alcohol/week: 0.0 standard drinks  . Drug use: No     Family Hx: The patient's family history includes Colon cancer in her brother.  ROS:   Please see the history of present illness.     All other systems reviewed and are negative.   Prior CV studies:   The following studies were reviewed today:  09/2015 echo Study Conclusions  - Left ventricle: The cavity size was normal. Wall thickness was  increased in a pattern of mild LVH. Systolic function was normal.  The estimated ejection fraction was in the range of 60% to 65%.  Diastolic function is abnormal, indeterminate grade. Wall motion  was normal; there were no regional wall motion abnormalities. - Aortic valve: Mildly calcified annulus. Trileaflet; mildly  thickened leaflets. Valve area (VTI): 1.52 cm^2. Valve area  (Vmax): 1.43 cm^2. - Mitral valve: Mildly calcified annulus. Normal thickness leaflets  . - Technically adequate study.   11/2015 CTA neck IMPRESSION: 1. 75% stenosis of the left internal carotid artery at the bifurcation secondary to prominent posterior lateral soft tissue neck calcified plaque. 2. Atherosclerotic calcifications at the right carotid bifurcation and origins the great vessels without other focal stenoses. 3. Focal contained dissection in  small pseudoaneurysm in the cervical left ICA measuring 8 mm in length. 4. Multilevel spondylosis of the cervical spine is most pronounced at C4-5 and C5-6.   Labs/Other Tests and Data Reviewed:    EKG:  No ECG reviewed.  Recent Labs: No results found for requested labs within last 8760 hours.   Recent Lipid Panel No results found for: CHOL, TRIG, HDL, CHOLHDL, LDLCALC, LDLDIRECT  Wt Readings from Last 3 Encounters:  11/16/18 198 lb (89.8 kg)  09/26/18 198 lb 3.2 oz (89.9 kg)  09/14/18 203 lb (92.1 kg)     Objective:    Vital Signs:   Today's Vitals   12/07/18 1452  BP: (!) 182/74  Pulse: 63  Weight: 188 lb (85.3 kg)  Height: 5\' 6"  (1.676 m)   Body mass index is 30.34 kg/m.  Normal affect. Normal speech pattern and tone. Comfortable in no apparent distress. No audible signs of SOB or wheezing.  ASSESSMENT & PLAN:    1. HTN  - above goal, increase hydralazine to 100mg  tid - call  with bp's in 1 week  2. LE edema - continue lasix, she is ok to take 20-40mg  as needed for swelling.      COVID-19 Education: The signs and symptoms of COVID-19 were discussed with the patient and how to seek care for testing (follow up with PCP or arrange E-visit).  The importance of social distancing was discussed today.  Time:   Today, I have spent 13 minutes with the patient with telehealth technology discussing the above problems.     Medication Adjustments/Labs and Tests Ordered: Current medicines are reviewed at length with the patient today.  Concerns regarding medicines are outlined above.   Tests Ordered: No orders of the defined types were placed in this encounter.   Medication Changes: No orders of the defined types were placed in this encounter.   Disposition:  Follow up 4 months  Signed, Carlyle Dolly, MD  12/07/2018 12:32 PM    [facility]

## 2018-12-07 NOTE — Patient Instructions (Signed)
Your physician recommends that you schedule a follow-up appointment in: [place] has recommended you make the following change in your medication:  [place] physician has requested that you regularly monitor and record your blood pressure readings at home FOR 1 WEEK AND CALL us WITH THE READINGS. Please use the same machine at the same time of day   Thank you for choosing [facility]!!

## 2018-12-14 DIAGNOSIS — E119 Type 2 diabetes mellitus without complications: Secondary | ICD-10-CM | POA: Diagnosis not present

## 2018-12-14 DIAGNOSIS — I10 Essential (primary) hypertension: Secondary | ICD-10-CM | POA: Diagnosis not present

## 2018-12-15 ENCOUNTER — Telehealth: Payer: Self-pay | Admitting: Cardiology

## 2018-12-15 NOTE — Telephone Encounter (Signed)
Calling [facility] to give BP readings, please call @ [phone]

## 2018-12-16 MED ORDER — AMLODIPINE BESYLATE 5 MG PO TABS: 5.0000 mg | ORAL_TABLET | Freq: Every day | ORAL | 0 refills | Status: DC

## 2018-12-16 MED ORDER — HYDRALAZINE HCL 100 MG PO TABS: 100.0000 mg | ORAL_TABLET | Freq: Three times a day (TID) | ORAL | 1 refills | Status: DC

## 2018-12-16 NOTE — Telephone Encounter (Signed)
Pt reporting BP readings after increasing hydralazine 100 mg tid - says she is in constant back pain and thinks this is why BP is elevated   5/24 BP 154/68 HR 62 5/25 BP 183/80 HR 63 5/26 BP 171/71 HR 69 5/27 BP 141/59 HR 67 5/28 BP 146/66 HR 68  5/29 BP 174/80 HR 67

## 2018-12-16 NOTE — Telephone Encounter (Signed)
BP still too high, pain could cause some part of it but not to this degree and not this consistent. Limited medication options due to her kidney function, Id like to retry norvasc 5mg  daily, this could add some to her leg swelling but if it is only mild it would be acceptable in order to get her bp down   J Tomeko Scoville MD

## 2018-12-16 NOTE — Telephone Encounter (Signed)
Pt voice understanding - amlodipine sent to [facility] - will update [place] on BP in a week or so after after taking amlodipine

## 2018-12-26 DIAGNOSIS — E119 Type 2 diabetes mellitus without complications: Secondary | ICD-10-CM | POA: Diagnosis not present

## 2018-12-26 DIAGNOSIS — I10 Essential (primary) hypertension: Secondary | ICD-10-CM | POA: Diagnosis not present

## 2018-12-29 ENCOUNTER — Telehealth: Payer: Self-pay | Admitting: *Deleted

## 2018-12-29 NOTE — Telephone Encounter (Signed)
Pt says since starting Amlodipine per last phone note has been weak/tired/no energy - stopped taking 2 nights ago and says she feels much better - also gave BP readings:  5/30 162/61 HR 71 5/31 170/79 HR 50 6/1 139/56 HR 66 6/2 157/70 HR 65 6/3 147/61 HR 65 6/4 140/69 HR 61 6/5 144/66 HR 67 6/6 167/73 HR 51 6/7 149/65 HR 60 6/8 132/71 HR 69 6/9 157/68 HR 68

## 2018-12-30 DIAGNOSIS — Z79899 Other long term (current) drug therapy: Secondary | ICD-10-CM | POA: Diagnosis not present

## 2018-12-30 DIAGNOSIS — G894 Chronic pain syndrome: Secondary | ICD-10-CM | POA: Diagnosis not present

## 2018-12-30 DIAGNOSIS — M129 Arthropathy, unspecified: Secondary | ICD-10-CM | POA: Diagnosis not present

## 2018-12-30 DIAGNOSIS — E559 Vitamin D deficiency, unspecified: Secondary | ICD-10-CM | POA: Diagnosis not present

## 2018-12-30 DIAGNOSIS — I10 Essential (primary) hypertension: Secondary | ICD-10-CM | POA: Diagnosis not present

## 2018-12-30 DIAGNOSIS — M47816 Spondylosis without myelopathy or radiculopathy, lumbar region: Secondary | ICD-10-CM | POA: Diagnosis not present

## 2018-12-30 NOTE — Telephone Encounter (Signed)
Is she willing to try norvasc 2.5mg  daily (1/2  of her 5mg  tablet) to see if tolerated. Pretty limited options on bp meds given her kidney function. Perhaps if she could try over the weekend and update [place] Monday.    Zandra Abts MD

## 2019-01-03 NOTE — Telephone Encounter (Signed)
Spoke with pt on 6/12 and pt was agreeable to try 1/2 dose of Amlodipine and will f/u if pt could tolerate

## 2019-01-03 NOTE — Telephone Encounter (Signed)
Pt says she took 2.5 mg of amlodipine and denies any symptoms. Says she will continue at this dose and will update [place] on BP in a week. Updated medication list

## 2019-01-04 ENCOUNTER — Ambulatory Visit (INDEPENDENT_AMBULATORY_CARE_PROVIDER_SITE_OTHER): Payer: Medicare Other | Admitting: Orthopaedic Surgery

## 2019-01-04 ENCOUNTER — Ambulatory Visit (INDEPENDENT_AMBULATORY_CARE_PROVIDER_SITE_OTHER): Payer: Medicare Other

## 2019-01-04 ENCOUNTER — Encounter: Payer: Self-pay | Admitting: Orthopaedic Surgery

## 2019-01-04 ENCOUNTER — Other Ambulatory Visit: Payer: Self-pay

## 2019-01-04 VITALS — BP 153/68 | Ht 66.5 in | Wt 198.0 lb

## 2019-01-04 DIAGNOSIS — I6523 Occlusion and stenosis of bilateral carotid arteries: Secondary | ICD-10-CM | POA: Diagnosis not present

## 2019-01-04 DIAGNOSIS — M79662 Pain in left lower leg: Secondary | ICD-10-CM | POA: Diagnosis not present

## 2019-01-04 DIAGNOSIS — M1712 Unilateral primary osteoarthritis, left knee: Secondary | ICD-10-CM

## 2019-01-04 DIAGNOSIS — R600 Localized edema: Secondary | ICD-10-CM | POA: Diagnosis not present

## 2019-01-04 DIAGNOSIS — R2242 Localized swelling, mass and lump, left lower limb: Secondary | ICD-10-CM | POA: Diagnosis not present

## 2019-01-04 DIAGNOSIS — M170 Bilateral primary osteoarthritis of knee: Secondary | ICD-10-CM

## 2019-01-04 MED ORDER — BUPIVACAINE HCL 0.5 % IJ SOLN
2.0000 mL | INTRAMUSCULAR | Status: AC | PRN
  Administered 2019-01-04: 2 mL via INTRA_ARTICULAR

## 2019-01-04 MED ORDER — METHYLPREDNISOLONE ACETATE 40 MG/ML IJ SUSP
80.0000 mg | INTRAMUSCULAR | Status: AC | PRN
  Administered 2019-01-04: 80 mg via INTRA_ARTICULAR

## 2019-01-04 MED ORDER — LIDOCAINE HCL 1 % IJ SOLN
2.0000 mL | INTRAMUSCULAR | Status: AC | PRN
  Administered 2019-01-04: 2 mL

## 2019-01-04 NOTE — Addendum Note (Signed)
Addended by: Lendon Collar on: 01/04/2019 04:28 PM   Modules accepted: Orders

## 2019-01-04 NOTE — Progress Notes (Signed)
Office Visit Note   Patient: Rhonda Mejia           Date of Birth: September 08, 1928           MRN: [id number] Visit Date: 01/04/2019              Requested by: Monico Blitz, MD [address] [place],  [place] [address] PCP: Monico Blitz, MD   Assessment & Plan: Visit Diagnoses:  1. Primary osteoarthritis of left knee   2. Bilateral primary osteoarthritis of knee     Plan: Progressive osteoarthritis left knee particularly in the lateral compartment.  Will reinject with cortisone.  I am also concerned about her calf pain and will order a Doppler study.  Follow-Up Instructions: Return if symptoms worsen or fail to improve.   Orders:  Orders Placed This Encounter  Procedures  . Large Joint Inj: L knee  . XR KNEE 3 VIEW LEFT   No orders of the defined types were placed in this encounter.     Procedures: Large Joint Inj: L knee on 01/04/2019 11:50 AM Indications: pain and diagnostic evaluation Details: 25 G 1.5 in needle, anterolateral approach  Arthrogram: No  Medications: 2 mL bupivacaine 0.5 %; 2 mL lidocaine 1 %; 80 mg methylPREDNISolone acetate 40 MG/ML Procedure, treatment alternatives, risks and benefits explained, specific risks discussed. Consent was given by the patient. Patient was prepped and draped in the usual sterile fashion.       Clinical Data: No additional findings.   Subjective: Chief Complaint  Patient presents with  . Left Knee - Pain  Patient presents today for a follow up on her left knee. She had a cortisone injection on 11/16/18. She said that her knee is still hurting. She has noticed that her left knee "wants to buckle". She also states that her lower left leg has been swelling the last two weeks. She takes Tramadol as needed.   HPI  Review of Systems   Objective: Vital Signs: BP (!) 153/68   Ht 5' 6.5" (1.689 m)   Wt 198 lb (89.8 kg)   BMI 31.48 kg/m   Physical Exam Constitutional:      Appearance: She is well-developed.  Eyes:   Pupils: Pupils are equal, round, and reactive to light.  Pulmonary:     Effort: Pulmonary effort is normal.  Skin:    General: Skin is warm and dry.  Neurological:     Mental Status: She is alert and oriented to person, place, and time.  Psychiatric:        Behavior: Behavior normal.     Ortho Exam awake alert and oriented x3.  Comfortable sitting.  Possibly small effusion left knee.  Knee was not hot or warm.  Diffuse calf pain but multiple areas of tenderness about the leg both anteriorly and posteriorly.  Chronic swelling of left ankle without much change.  Negative Homans sign full extension left knee in flexed about 100 degrees without instability.  Having more pain laterally than medially.  Specialty Comments:  No specialty comments available.  Imaging: Xr Knee 3 View Left  Result Date: 01/04/2019 The left knee were obtained in several projections standing and compared to the films performed in 2018.  There is progression of joint space narrowing in the lateral compartment with bone-on-bone, subchondral sclerosis and small subchondral cysts.  There is persistent ectopic calcification consistent with chondrocalcinosis.  Films are consistent with advanced osteoarthritis particularly of the lateral compartment    PMFS History: Patient Active  Problem List   Diagnosis Date Noted  . Bilateral primary osteoarthritis of knee 11/03/2017  . Dysphagia, unspecified(787.20) 06/07/2013  . Unspecified hypothyroidism 10/10/2012  . High cholesterol 10/10/2012  . Essential hypertension, benign 10/10/2012  . Rectal bleeding 10/10/2012  . Pill dysphagia 10/10/2012  . Family hx of colon cancer 10/10/2012  . Anemia 10/10/2012   Past Medical History:  Diagnosis Date  . Cancer [facility])    left breast cancer  . Carotid artery occlusion   . Diabetes mellitus without complication (HCC)    diet controlled  . Family hx of colon cancer   . GERD (gastroesophageal reflux disease)   . High  cholesterol   . History of gout   . Hypertension    for over 20 yrs  . Hypothyroid   . Neuropathy   . Pill dysphagia     Family History  Problem Relation Age of Onset  . Colon cancer Brother     Past Surgical History:  Procedure Laterality Date  . BACK SURGERY     5 rods and 5 screws and cage in back, has had 4 back surgeries.  Marland Kitchen BREAST LUMPECTOMY     left breast  . CATARACT EXTRACTION W/PHACO Left 12/31/2014   Procedure: CATARACT EXTRACTION PHACO AND INTRAOCULAR LENS PLACEMENT (IOC);  Surgeon: Tonny Branch, MD;  Location: [facility] OR[facility];  Service: Ophthalmology;  Laterality: Left;  CDE:8.45  . CATARACT EXTRACTION W/PHACO Right 01/28/2015   Procedure: CATARACT EXTRACTION PHACO AND INTRAOCULAR LENS PLACEMENT RIGHT EYE;  Surgeon: Tonny Branch, MD;  Location: [facility] OR[facility];  Service: Ophthalmology;  Laterality: Right;  CDE:4.92  . CHOLECYSTECTOMY    . COLONOSCOPY N/A 10/21/2012   Procedure: COLONOSCOPY;  Surgeon: Rogene Houston, MD;  [facility]: [facility];  Service: Endoscopy;  Laterality: N/A;  . ESOPHAGOGASTRODUODENOSCOPY (EGD) WITH ESOPHAGEAL DILATION N/A 06/09/2013   Procedure: ESOPHAGOGASTRODUODENOSCOPY (EGD) WITH ESOPHAGEAL DILATION;  Surgeon: Rogene Houston, MD;  [facility];  Service: Endoscopy;  Laterality: N/A;  125  . KNEE SURGERY Right    arthroscopy x2  . SPINE SURGERY     3 surgeries by Dr. Jenne Campus,  Last surgery 2 years ago by Dr. Carloyn Manner in [place] History  . Not on file  Tobacco Use  . Smoking status: Never Smoker  . Smokeless tobacco: Never Used  Substance and Sexual Activity  . Alcohol use: No    Alcohol/week: 0.0 standard drinks  . Drug use: No  . Sexual activity: Yes    Birth control/protection: Post-menopausal

## 2019-01-09 DIAGNOSIS — G894 Chronic pain syndrome: Secondary | ICD-10-CM | POA: Diagnosis not present

## 2019-01-09 DIAGNOSIS — M25561 Pain in right knee: Secondary | ICD-10-CM | POA: Diagnosis not present

## 2019-01-09 DIAGNOSIS — R262 Difficulty in walking, not elsewhere classified: Secondary | ICD-10-CM | POA: Diagnosis not present

## 2019-01-09 DIAGNOSIS — R2681 Unsteadiness on feet: Secondary | ICD-10-CM | POA: Diagnosis not present

## 2019-01-09 DIAGNOSIS — M545 Low back pain: Secondary | ICD-10-CM | POA: Diagnosis not present

## 2019-01-09 DIAGNOSIS — M25562 Pain in left knee: Secondary | ICD-10-CM | POA: Diagnosis not present

## 2019-01-09 DIAGNOSIS — M6281 Muscle weakness (generalized): Secondary | ICD-10-CM | POA: Diagnosis not present

## 2019-01-09 DIAGNOSIS — Z9181 History of falling: Secondary | ICD-10-CM | POA: Diagnosis not present

## 2019-01-10 DIAGNOSIS — M25562 Pain in left knee: Secondary | ICD-10-CM | POA: Diagnosis not present

## 2019-01-10 DIAGNOSIS — R2681 Unsteadiness on feet: Secondary | ICD-10-CM | POA: Diagnosis not present

## 2019-01-10 DIAGNOSIS — Z9181 History of falling: Secondary | ICD-10-CM | POA: Diagnosis not present

## 2019-01-10 DIAGNOSIS — R262 Difficulty in walking, not elsewhere classified: Secondary | ICD-10-CM | POA: Diagnosis not present

## 2019-01-10 DIAGNOSIS — M25561 Pain in right knee: Secondary | ICD-10-CM | POA: Diagnosis not present

## 2019-01-10 DIAGNOSIS — G894 Chronic pain syndrome: Secondary | ICD-10-CM | POA: Diagnosis not present

## 2019-01-10 DIAGNOSIS — M545 Low back pain: Secondary | ICD-10-CM | POA: Diagnosis not present

## 2019-01-10 DIAGNOSIS — M6281 Muscle weakness (generalized): Secondary | ICD-10-CM | POA: Diagnosis not present

## 2019-01-11 DIAGNOSIS — M25562 Pain in left knee: Secondary | ICD-10-CM | POA: Diagnosis not present

## 2019-01-11 DIAGNOSIS — M25561 Pain in right knee: Secondary | ICD-10-CM | POA: Diagnosis not present

## 2019-01-11 DIAGNOSIS — M6281 Muscle weakness (generalized): Secondary | ICD-10-CM | POA: Diagnosis not present

## 2019-01-11 DIAGNOSIS — M545 Low back pain: Secondary | ICD-10-CM | POA: Diagnosis not present

## 2019-01-11 DIAGNOSIS — Z9181 History of falling: Secondary | ICD-10-CM | POA: Diagnosis not present

## 2019-01-11 DIAGNOSIS — R2681 Unsteadiness on feet: Secondary | ICD-10-CM | POA: Diagnosis not present

## 2019-01-11 DIAGNOSIS — G894 Chronic pain syndrome: Secondary | ICD-10-CM | POA: Diagnosis not present

## 2019-01-11 DIAGNOSIS — R262 Difficulty in walking, not elsewhere classified: Secondary | ICD-10-CM | POA: Diagnosis not present

## 2019-01-16 DIAGNOSIS — R2681 Unsteadiness on feet: Secondary | ICD-10-CM | POA: Diagnosis not present

## 2019-01-16 DIAGNOSIS — M6281 Muscle weakness (generalized): Secondary | ICD-10-CM | POA: Diagnosis not present

## 2019-01-16 DIAGNOSIS — M25561 Pain in right knee: Secondary | ICD-10-CM | POA: Diagnosis not present

## 2019-01-16 DIAGNOSIS — G894 Chronic pain syndrome: Secondary | ICD-10-CM | POA: Diagnosis not present

## 2019-01-16 DIAGNOSIS — M25562 Pain in left knee: Secondary | ICD-10-CM | POA: Diagnosis not present

## 2019-01-16 DIAGNOSIS — Z9181 History of falling: Secondary | ICD-10-CM | POA: Diagnosis not present

## 2019-01-16 DIAGNOSIS — M545 Low back pain: Secondary | ICD-10-CM | POA: Diagnosis not present

## 2019-01-16 DIAGNOSIS — R262 Difficulty in walking, not elsewhere classified: Secondary | ICD-10-CM | POA: Diagnosis not present

## 2019-01-17 DIAGNOSIS — M25562 Pain in left knee: Secondary | ICD-10-CM | POA: Diagnosis not present

## 2019-01-17 DIAGNOSIS — M6281 Muscle weakness (generalized): Secondary | ICD-10-CM | POA: Diagnosis not present

## 2019-01-17 DIAGNOSIS — Z9181 History of falling: Secondary | ICD-10-CM | POA: Diagnosis not present

## 2019-01-17 DIAGNOSIS — M545 Low back pain: Secondary | ICD-10-CM | POA: Diagnosis not present

## 2019-01-17 DIAGNOSIS — M25561 Pain in right knee: Secondary | ICD-10-CM | POA: Diagnosis not present

## 2019-01-17 DIAGNOSIS — R2681 Unsteadiness on feet: Secondary | ICD-10-CM | POA: Diagnosis not present

## 2019-01-17 DIAGNOSIS — G894 Chronic pain syndrome: Secondary | ICD-10-CM | POA: Diagnosis not present

## 2019-01-17 DIAGNOSIS — R262 Difficulty in walking, not elsewhere classified: Secondary | ICD-10-CM | POA: Diagnosis not present

## 2019-01-18 ENCOUNTER — Ambulatory Visit (INDEPENDENT_AMBULATORY_CARE_PROVIDER_SITE_OTHER): Payer: Medicare Other | Admitting: Orthopaedic Surgery

## 2019-01-18 ENCOUNTER — Encounter: Payer: Self-pay | Admitting: Orthopaedic Surgery

## 2019-01-18 ENCOUNTER — Other Ambulatory Visit: Payer: Self-pay

## 2019-01-18 VITALS — BP 163/68 | HR 65 | Ht 67.0 in | Wt 175.0 lb

## 2019-01-18 DIAGNOSIS — M1712 Unilateral primary osteoarthritis, left knee: Secondary | ICD-10-CM | POA: Diagnosis not present

## 2019-01-18 DIAGNOSIS — M170 Bilateral primary osteoarthritis of knee: Secondary | ICD-10-CM

## 2019-01-18 MED ORDER — SODIUM HYALURONATE (VISCOSUP) 20 MG/2ML IX SOSY
20.0000 mg | PREFILLED_SYRINGE | INTRA_ARTICULAR | Status: AC | PRN
  Administered 2019-01-18: 14:00:00 20 mg via INTRA_ARTICULAR

## 2019-01-18 NOTE — Progress Notes (Signed)
Office Visit Note   Patient: Rhonda Mejia           Date of Birth: August 31, 1928           MRN: [id number] Visit Date: 01/18/2019              Requested by: Monico Blitz, MD [address] [place],  [place] [address] PCP: Monico Blitz, MD   Assessment & Plan: Visit Diagnoses:  1. Bilateral primary osteoarthritis of knee     Plan: First Euflexxa injection left knee.  Return weekly for the next 2 weeks to complete the series  Follow-Up Instructions: Return in about 1 week (around 01/25/2019).   Orders:  Orders Placed This Encounter  Procedures  . Large Joint Inj: L knee   No orders of the defined types were placed in this encounter.     Procedures: Large Joint Inj: L knee on 01/18/2019 2:00 PM Indications: pain and joint swelling Details: 25 G 1.5 in needle, anteromedial approach  Arthrogram: No  Medications: 20 mg Sodium Hyaluronate 20 MG/2ML Outcome: tolerated well, no immediate complications Procedure, treatment alternatives, risks and benefits explained, specific risks discussed. Consent was given by the patient. Immediately prior to procedure a time out was called to verify the correct patient, procedure, equipment, support staff and site/side marked as required. Patient was prepped and draped in the usual sterile fashion.       Clinical Data: No additional findings.   Subjective: Chief Complaint  Patient presents with  . Left Knee - Pain    Euflexxa #1  Patient presents for Euflexxa #1 injection in her left knee.  HPI  Review of Systems   Objective: Vital Signs: BP (!) 163/68   Pulse 65   Ht 5\' 7"  (1.702 m)   Wt 175 lb (79.4 kg)   BMI 27.41 kg/m   Physical Exam  Ortho Exam left knee with tenderness more lateral than medial.  Some patellar crepitation.  Minimal effusion. Specialty Comments:  No specialty comments available.  Imaging: No results found.   PMFS History: Patient Active Problem List   Diagnosis Date Noted  . Bilateral primary  osteoarthritis of knee 11/03/2017  . Dysphagia, unspecified(787.20) 06/07/2013  . Unspecified hypothyroidism 10/10/2012  . High cholesterol 10/10/2012  . Essential hypertension, benign 10/10/2012  . Rectal bleeding 10/10/2012  . Pill dysphagia 10/10/2012  . Family hx of colon cancer 10/10/2012  . Anemia 10/10/2012   Past Medical History:  Diagnosis Date  . Cancer [facility])    left breast cancer  . Carotid artery occlusion   . Diabetes mellitus without complication (HCC)    diet controlled  . Family hx of colon cancer   . GERD (gastroesophageal reflux disease)   . High cholesterol   . History of gout   . Hypertension    for over 20 yrs  . Hypothyroid   . Neuropathy   . Pill dysphagia     Family History  Problem Relation Age of Onset  . Colon cancer Brother     Past Surgical History:  Procedure Laterality Date  . BACK SURGERY     5 rods and 5 screws and cage in back, has had 4 back surgeries.  Marland Kitchen BREAST LUMPECTOMY     left breast  . CATARACT EXTRACTION W/PHACO Left 12/31/2014   Procedure: CATARACT EXTRACTION PHACO AND INTRAOCULAR LENS PLACEMENT (IOC);  Surgeon: Tonny Branch, MD;  Location: [facility];  Service: Ophthalmology;  Laterality: Left;  CDE:8.45  . CATARACT EXTRACTION W/PHACO Right  01/28/2015   Procedure: CATARACT EXTRACTION PHACO AND INTRAOCULAR LENS PLACEMENT RIGHT EYE;  Surgeon: Tonny Branch, MD;  Location: [facility];  Service: Ophthalmology;  Laterality: Right;  CDE:4.92  . CHOLECYSTECTOMY    . COLONOSCOPY N/A 10/21/2012   Procedure: COLONOSCOPY;  Surgeon: Rogene Houston, MD;  [facility];  Service: Endoscopy;  Laterality: N/A;  . ESOPHAGOGASTRODUODENOSCOPY (EGD) WITH ESOPHAGEAL DILATION N/A 06/09/2013   Procedure: ESOPHAGOGASTRODUODENOSCOPY (EGD) WITH ESOPHAGEAL DILATION;  Surgeon: Rogene Houston, MD;  [facility];  Service: Endoscopy;  Laterality: N/A;  125  . KNEE SURGERY Right    arthroscopy x2  . SPINE SURGERY     3 surgeries by Dr. Jenne Campus,  Last surgery 2 years ago by Dr. Carloyn Manner in [place] History  . Not on file  Tobacco Use  . Smoking status: Never Smoker  . Smokeless tobacco: Never Used  Substance and Sexual Activity  . Alcohol use: No    Alcohol/week: 0.0 standard drinks  . Drug use: No  . Sexual activity: Yes    Birth control/protection: Post-menopausal

## 2019-01-24 DIAGNOSIS — E119 Type 2 diabetes mellitus without complications: Secondary | ICD-10-CM | POA: Diagnosis not present

## 2019-01-24 DIAGNOSIS — I10 Essential (primary) hypertension: Secondary | ICD-10-CM | POA: Diagnosis not present

## 2019-01-25 ENCOUNTER — Ambulatory Visit (INDEPENDENT_AMBULATORY_CARE_PROVIDER_SITE_OTHER): Payer: Medicare Other | Admitting: Orthopaedic Surgery

## 2019-01-25 ENCOUNTER — Encounter: Payer: Self-pay | Admitting: Orthopaedic Surgery

## 2019-01-25 ENCOUNTER — Other Ambulatory Visit: Payer: Self-pay

## 2019-01-25 DIAGNOSIS — M25562 Pain in left knee: Secondary | ICD-10-CM

## 2019-01-25 DIAGNOSIS — G8929 Other chronic pain: Secondary | ICD-10-CM

## 2019-01-25 NOTE — Progress Notes (Signed)
PROCEDURE NOTE:  Synvisc injection #  2 of 3   Injection 1 vial of Synvisc into left  knee  There were no vitals taken for this visit.  The left knee exam: there was no synovitis or infection   The knee was prepped sterilely  Ethyl chloride was used to anesthetize the skin A 20 g needle was used to inject the knee with 1 vial of Synvisc A sterile dressing was placed  There were no complications  Encounter Diagnosis  Name Primary?  . Chronic pain of left knee Yes    Follow up one week  This patient is diagnosed with osteoarthritis of the knee(s).    Radiographs show evidence of joint space narrowing, osteophytes, subchondral sclerosis and/or subchondral cysts.  This patient has knee pain which interferes with functional and activities of daily living.    This patient has experienced inadequate response, adverse effects and/or intolerance with conservative treatments such as acetaminophen, NSAIDS, topical creams, physical therapy or regular exercise, knee bracing and/or weight loss.   This patient has experienced inadequate response or has a contraindication to intra articular steroid injections for at least 3 months.   This patient is not scheduled to have a total knee replacement within 6 months of starting treatment with viscosupplementation.  Electronically Signed Sanjuana Kava, MD 7/8/202010:54 AM

## 2019-02-01 ENCOUNTER — Other Ambulatory Visit: Payer: Self-pay

## 2019-02-01 ENCOUNTER — Ambulatory Visit (INDEPENDENT_AMBULATORY_CARE_PROVIDER_SITE_OTHER): Payer: Medicare Other | Admitting: Orthopaedic Surgery

## 2019-02-01 ENCOUNTER — Encounter: Payer: Self-pay | Admitting: Orthopaedic Surgery

## 2019-02-01 VITALS — BP 154/78 | HR 67 | Ht 67.0 in | Wt 175.0 lb

## 2019-02-01 DIAGNOSIS — M170 Bilateral primary osteoarthritis of knee: Secondary | ICD-10-CM

## 2019-02-01 DIAGNOSIS — M1712 Unilateral primary osteoarthritis, left knee: Secondary | ICD-10-CM

## 2019-02-01 MED ORDER — SODIUM HYALURONATE (VISCOSUP) 20 MG/2ML IX SOSY
20.0000 mg | PREFILLED_SYRINGE | INTRA_ARTICULAR | Status: AC | PRN
  Administered 2019-02-01: 20 mg via INTRA_ARTICULAR

## 2019-02-01 NOTE — Progress Notes (Signed)
Office Visit Note   Patient: Rhonda Mejia           Date of Birth: 12-17-1928           MRN: [id number] Visit Date: 02/01/2019              Requested by: Monico Blitz, MD [address] [place],  [place] [address] PCP: Monico Blitz, MD   Assessment & Plan: Visit Diagnoses:  1. Bilateral primary osteoarthritis of knee     Plan: Third Euflexxa injection left knee.  Return as needed  Follow-Up Instructions: Return if symptoms worsen or fail to improve.   Orders:  Orders Placed This Encounter  Procedures  . Large Joint Inj: L knee   No orders of the defined types were placed in this encounter.     Procedures: Large Joint Inj: L knee on 02/01/2019 1:12 PM Indications: pain and joint swelling Details: 25 G 1.5 in needle, anteromedial approach  Arthrogram: No  Medications: 20 mg Sodium Hyaluronate 20 MG/2ML Outcome: tolerated well, no immediate complications Procedure, treatment alternatives, risks and benefits explained, specific risks discussed. Consent was given by the patient. Immediately prior to procedure a time out was called to verify the correct patient, procedure, equipment, support staff and site/side marked as required. Patient was prepped and draped in the usual sterile fashion.       Clinical Data: No additional findings.   Subjective: Chief Complaint  Patient presents with  . Left Knee - Follow-up    Euflexxa started on 01/18/2019  Patient presents today for the third Euflexxa injection in the left knee. She started the injections on 01/18/2019.   HPI  Review of Systems   Objective: Vital Signs: BP (!) 154/78   Pulse 67   Ht 5\' 7"  (1.702 m)   Wt 175 lb (79.4 kg)   BMI 27.41 kg/m   Physical Exam  Ortho Exam left knee was not hot warm red or swollen. Specialty Comments:  No specialty comments available.  Imaging: No results found.   PMFS History: Patient Active Problem List   Diagnosis Date Noted  . Bilateral primary osteoarthritis of  knee 11/03/2017  . Dysphagia, unspecified(787.20) 06/07/2013  . Unspecified hypothyroidism 10/10/2012  . High cholesterol 10/10/2012  . Essential hypertension, benign 10/10/2012  . Rectal bleeding 10/10/2012  . Pill dysphagia 10/10/2012  . Family hx of colon cancer 10/10/2012  . Anemia 10/10/2012   Past Medical History:  Diagnosis Date  . Cancer [facility])    left breast cancer  . Carotid artery occlusion   . Diabetes mellitus without complication (HCC)    diet controlled  . Family hx of colon cancer   . GERD (gastroesophageal reflux disease)   . High cholesterol   . History of gout   . Hypertension    for over 20 yrs  . Hypothyroid   . Neuropathy   . Pill dysphagia     Family History  Problem Relation Age of Onset  . Colon cancer Brother     Past Surgical History:  Procedure Laterality Date  . BACK SURGERY     5 rods and 5 screws and cage in back, has had 4 back surgeries.  Marland Kitchen BREAST LUMPECTOMY     left breast  . CATARACT EXTRACTION W/PHACO Left 12/31/2014   Procedure: CATARACT EXTRACTION PHACO AND INTRAOCULAR LENS PLACEMENT (IOC);  Surgeon: Tonny Branch, MD;  Location: [facility];  Service: Ophthalmology;  Laterality: Left;  CDE:8.45  . CATARACT EXTRACTION W/PHACO Right 01/28/2015  Procedure: CATARACT EXTRACTION PHACO AND INTRAOCULAR LENS PLACEMENT RIGHT EYE;  Surgeon: Tonny Branch, MD;  Location: [facility];  Service: Ophthalmology;  Laterality: Right;  CDE:4.92  . CHOLECYSTECTOMY    . COLONOSCOPY N/A 10/21/2012   Procedure: COLONOSCOPY;  Surgeon: Rogene Houston, MD;  Location: [facility];  Service: Endoscopy;  Laterality: N/A;  . ESOPHAGOGASTRODUODENOSCOPY (EGD) WITH ESOPHAGEAL DILATION N/A 06/09/2013   Procedure: ESOPHAGOGASTRODUODENOSCOPY (EGD) WITH ESOPHAGEAL DILATION;  Surgeon: Rogene Houston, MD;  [facility];  Service: Endoscopy;  Laterality: N/A;  125  . KNEE SURGERY Right    arthroscopy x2  . SPINE SURGERY     3 surgeries by Dr. Jenne Campus,  Last surgery 2  years ago by Dr. Carloyn Manner in [place] History  . Not on file  Tobacco Use  . Smoking status: Never Smoker  . Smokeless tobacco: Never Used  Substance and Sexual Activity  . Alcohol use: No    Alcohol/week: 0.0 standard drinks  . Drug use: No  . Sexual activity: Yes    Birth control/protection: Post-menopausal

## 2019-02-07 DIAGNOSIS — E1151 Type 2 diabetes mellitus with diabetic peripheral angiopathy without gangrene: Secondary | ICD-10-CM | POA: Diagnosis not present

## 2019-02-07 DIAGNOSIS — Z299 Encounter for prophylactic measures, unspecified: Secondary | ICD-10-CM | POA: Diagnosis not present

## 2019-02-07 DIAGNOSIS — I10 Essential (primary) hypertension: Secondary | ICD-10-CM | POA: Diagnosis not present

## 2019-02-07 DIAGNOSIS — Z713 Dietary counseling and surveillance: Secondary | ICD-10-CM | POA: Diagnosis not present

## 2019-02-07 DIAGNOSIS — E1140 Type 2 diabetes mellitus with diabetic neuropathy, unspecified: Secondary | ICD-10-CM | POA: Diagnosis not present

## 2019-02-07 DIAGNOSIS — Z6832 Body mass index (BMI) 32.0-32.9, adult: Secondary | ICD-10-CM | POA: Diagnosis not present

## 2019-02-07 DIAGNOSIS — E1165 Type 2 diabetes mellitus with hyperglycemia: Secondary | ICD-10-CM | POA: Diagnosis not present

## 2019-02-27 DIAGNOSIS — I10 Essential (primary) hypertension: Secondary | ICD-10-CM | POA: Diagnosis not present

## 2019-02-27 DIAGNOSIS — E119 Type 2 diabetes mellitus without complications: Secondary | ICD-10-CM | POA: Diagnosis not present

## 2019-03-01 ENCOUNTER — Ambulatory Visit (INDEPENDENT_AMBULATORY_CARE_PROVIDER_SITE_OTHER): Payer: Medicare Other | Admitting: Orthopaedic Surgery

## 2019-03-01 ENCOUNTER — Other Ambulatory Visit: Payer: Self-pay

## 2019-03-01 ENCOUNTER — Encounter: Payer: Self-pay | Admitting: Orthopaedic Surgery

## 2019-03-01 VITALS — BP 163/71 | HR 64 | Ht 67.0 in | Wt 197.0 lb

## 2019-03-01 DIAGNOSIS — I6523 Occlusion and stenosis of bilateral carotid arteries: Secondary | ICD-10-CM

## 2019-03-01 DIAGNOSIS — M170 Bilateral primary osteoarthritis of knee: Secondary | ICD-10-CM

## 2019-03-01 MED ORDER — LIDOCAINE HCL 1 % IJ SOLN
2.0000 mL | INTRAMUSCULAR | Status: AC | PRN
  Administered 2019-03-01: 2 mL

## 2019-03-01 MED ORDER — BUPIVACAINE HCL 0.5 % IJ SOLN
2.0000 mL | INTRAMUSCULAR | Status: AC | PRN
  Administered 2019-03-01: 2 mL via INTRA_ARTICULAR

## 2019-03-01 MED ORDER — METHYLPREDNISOLONE ACETATE 40 MG/ML IJ SUSP
80.0000 mg | INTRAMUSCULAR | Status: AC | PRN
  Administered 2019-03-01: 14:00:00 80 mg via INTRA_ARTICULAR

## 2019-03-01 NOTE — Progress Notes (Signed)
Office Visit Note   Patient: Rhonda Mejia           Date of Birth: 1929/03/28           MRN: [id number] Visit Date: 03/01/2019              Requested by: Monico Blitz, MD [address] [place],  [place] [address] PCP: Monico Blitz, MD   Assessment & Plan: Visit Diagnoses:  1. Bilateral primary osteoarthritis of knee     Plan: Mrs. Ow is still having some trouble with her knee even after the Visco supplementation she does have end-stage osteoarthritis so I am going to inject the lateral aspect of her left knee with cortisone.  She does have his knee support at home.  Also has chronic problems with her back having been followed by Dr. Carloyn Manner who is retired.  Suggested her primary care physician make referrals  Follow-Up Instructions: Return if symptoms worsen or fail to improve.   Orders:  Orders Placed This Encounter  Procedures  . Large Joint Inj: L knee   No orders of the defined types were placed in this encounter.     Procedures: Large Joint Inj: L knee on 03/01/2019 2:26 PM Indications: pain and diagnostic evaluation Details: 25 G 1.5 in needle, anteromedial approach  Arthrogram: No  Medications: 2 mL lidocaine 1 %; 2 mL bupivacaine 0.5 %; 80 mg methylPREDNISolone acetate 40 MG/ML Procedure, treatment alternatives, risks and benefits explained, specific risks discussed. Consent was given by the patient. Patient was prepped and draped in the usual sterile fashion.       Clinical Data: No additional findings.   Subjective: Chief Complaint  Patient presents with  . Left Knee - Follow-up  Patient presents today for follow up on her left knee. She finished the Euflexxa series on the left side on 02/01/2019. She states that the injections did not help. She continues to hurt and swell. She has a difficult time ambulating. Her knee feels weak and tries to buckle, per patient. She takes tramadol three times daily and Ibuprofen as needed.  HPI  Review of Systems    Objective: Vital Signs: BP (!) 163/71   Pulse 64   Ht 5\' 7"  (1.702 m)   Wt 197 lb (89.4 kg)   BMI 30.85 kg/m   Physical Exam Constitutional:      Appearance: She is well-developed.  Eyes:     Pupils: Pupils are equal, round, and reactive to light.  Pulmonary:     Effort: Pulmonary effort is normal.  Skin:    General: Skin is warm and dry.  Neurological:     Mental Status: She is alert and oriented to person, place, and time.  Psychiatric:        Behavior: Behavior normal.     Ortho Exam left knee was not hot red warm or swollen.  Full extension and about 100 degrees of flexion without instability.  Predominately lateral joint pain with increased valgus with weightbearing consistent with her tricompartmental degenerative change and valgus position by plain films.  Specialty Comments:  No specialty comments available.  Imaging: No results found.   PMFS History: Patient Active Problem List   Diagnosis Date Noted  . Bilateral primary osteoarthritis of knee 11/03/2017  . Dysphagia, unspecified(787.20) 06/07/2013  . Unspecified hypothyroidism 10/10/2012  . High cholesterol 10/10/2012  . Essential hypertension, benign 10/10/2012  . Rectal bleeding 10/10/2012  . Pill dysphagia 10/10/2012  . Family hx of colon cancer 10/10/2012  .  Anemia 10/10/2012   Past Medical History:  Diagnosis Date  . Cancer [facility])    left breast cancer  . Carotid artery occlusion   . Diabetes mellitus without complication (HCC)    diet controlled  . Family hx of colon cancer   . GERD (gastroesophageal reflux disease)   . High cholesterol   . History of gout   . Hypertension    for over 20 yrs  . Hypothyroid   . Neuropathy   . Pill dysphagia     Family History  Problem Relation Age of Onset  . Colon cancer Brother     Past Surgical History:  Procedure Laterality Date  . BACK SURGERY     5 rods and 5 screws and cage in back, has had 4 back surgeries.  Marland Kitchen BREAST LUMPECTOMY     left  breast  . CATARACT EXTRACTION W/PHACO Left 12/31/2014   Procedure: CATARACT EXTRACTION PHACO AND INTRAOCULAR LENS PLACEMENT (IOC);  Surgeon: Tonny Branch, MD;  Location: [facility];  Service: Ophthalmology;  Laterality: Left;  CDE:8.45  . CATARACT EXTRACTION W/PHACO Right 01/28/2015   Procedure: CATARACT EXTRACTION PHACO AND INTRAOCULAR LENS PLACEMENT RIGHT EYE;  Surgeon: Tonny Branch, MD;  Location: [facility];  Service: Ophthalmology;  Laterality: Right;  CDE:4.92  . CHOLECYSTECTOMY    . COLONOSCOPY N/A 10/21/2012   Procedure: COLONOSCOPY;  Surgeon: Rogene Houston, MD;  [facility];  Service: Endoscopy;  Laterality: N/A;  . ESOPHAGOGASTRODUODENOSCOPY (EGD) WITH ESOPHAGEAL DILATION N/A 06/09/2013   Procedure: ESOPHAGOGASTRODUODENOSCOPY (EGD) WITH ESOPHAGEAL DILATION;  Surgeon: Rogene Houston, MD;  [facility];  Service: Endoscopy;  Laterality: N/A;  125  . KNEE SURGERY Right    arthroscopy x2  . SPINE SURGERY     3 surgeries by Dr. Jenne Campus,  Last surgery 2 years ago by Dr. Carloyn Manner in [place] History  . Not on file  Tobacco Use  . Smoking status: Never Smoker  . Smokeless tobacco: Never Used  Substance and Sexual Activity  . Alcohol use: No    Alcohol/week: 0.0 standard drinks  . Drug use: No  . Sexual activity: Yes    Birth control/protection: Post-menopausal

## 2019-03-20 DIAGNOSIS — R51 Headache: Secondary | ICD-10-CM | POA: Diagnosis not present

## 2019-03-20 DIAGNOSIS — S0990XA Unspecified injury of head, initial encounter: Secondary | ICD-10-CM | POA: Diagnosis not present

## 2019-03-20 DIAGNOSIS — M542 Cervicalgia: Secondary | ICD-10-CM | POA: Diagnosis not present

## 2019-03-20 DIAGNOSIS — S199XXA Unspecified injury of neck, initial encounter: Secondary | ICD-10-CM | POA: Diagnosis not present

## 2019-03-28 DIAGNOSIS — Z6832 Body mass index (BMI) 32.0-32.9, adult: Secondary | ICD-10-CM | POA: Diagnosis not present

## 2019-03-28 DIAGNOSIS — M1711 Unilateral primary osteoarthritis, right knee: Secondary | ICD-10-CM | POA: Diagnosis not present

## 2019-03-28 DIAGNOSIS — Z299 Encounter for prophylactic measures, unspecified: Secondary | ICD-10-CM | POA: Diagnosis not present

## 2019-04-10 ENCOUNTER — Encounter: Payer: Self-pay | Admitting: *Deleted

## 2019-04-10 ENCOUNTER — Telehealth (INDEPENDENT_AMBULATORY_CARE_PROVIDER_SITE_OTHER): Payer: Medicare Other | Admitting: Cardiology

## 2019-04-10 ENCOUNTER — Encounter: Payer: Self-pay | Admitting: Cardiology

## 2019-04-10 VITALS — Ht 65.5 in | Wt 172.0 lb

## 2019-04-10 DIAGNOSIS — I6523 Occlusion and stenosis of bilateral carotid arteries: Secondary | ICD-10-CM

## 2019-04-10 DIAGNOSIS — R600 Localized edema: Secondary | ICD-10-CM | POA: Diagnosis not present

## 2019-04-10 DIAGNOSIS — I10 Essential (primary) hypertension: Secondary | ICD-10-CM | POA: Diagnosis not present

## 2019-04-10 MED ORDER — LABETALOL HCL 200 MG PO TABS: 200.0000 mg | ORAL_TABLET | Freq: Two times a day (BID) | ORAL | 2 refills | Status: DC

## 2019-04-10 NOTE — Progress Notes (Signed)
Virtual Visit via Telephone Note   This visit type was conducted due to national recommendations for restrictions regarding the COVID-19 Pandemic (e.g. social distancing) in an effort to limit this patient's exposure and mitigate transmission in our community.  Due to her co-morbid illnesses, this patient is at least at moderate risk for complications without adequate follow up.  This format is felt to be most appropriate for this patient at this time.  The patient did not have access to video technology/had technical difficulties with video requiring transitioning to audio format only (telephone).  All issues noted in this document were discussed and addressed.  No physical exam could be performed with this format.  Please refer to the patient's chart for her  consent to telehealth for [facility].   Date:  04/10/2019   ID:  Rhonda Mejia, DOB Feb 14, 1929, MRN [id number]  Patient Location: Home Provider Location: Office  PCP:  Monico Blitz, MD  Cardiologist:  Carlyle Dolly, MD  Electrophysiologist:  None   Evaluation Performed:  Follow-Up Visit  Chief Complaint:  Follow up visit  History of Present Illness:    Rhonda Mejia is a 85 y.o. female seen today for follow up of the following medical problems.  1. HTN  - checking bp regularly, up and down as high as SBP 130s-180s.  - compliant with meds - norvasc stopped previously, suspected contributed to her LE edema.  - restarted norvasc 5mg  daily, hydralazine increased to 100mg  tid since last visit - she felt weak and fatigued on norvasc 5mg , lowered to 2.5mg  daily - average HRs mid 60s  - bps 150s-160s at recent ortho visit  2. LE edema - echo 02/2015 difficult study ,with LVEF 50-55%, normal RV function. Diastolic function not described.  - norvasc stopped due to LE edema. Lasix increased to 40mg  daily. Edema has improved  - repeat echo 09/2015 LVEF 60-65%, abnormal diastolic function grade indeterminate    - followed by vascular for chronic venous insufficiency -unable to wearcompression stockings due to ankle fracture  - some recent LE edema - taking lasix 40mg  x 4 days, with some improvement.      3. Carotid stenosis - carotid US 09/2015 with concern for distal left CCA stenosis, 1-39% right ICA stenosis.  - 11/2015 CTA neck with [id number] LICA stenosis, focal contained dissecion left ICA, no significant RICA disease - followed by vascular. From there consultation CTA results thought to be misleading due to heavy calficications and fairly mild velocities by carotid US.    4. Back pain - followed by Dr Carloyn Manner  5. DM2 -she isdiet controlled, followed by pcp  6. Right angle fracture -has finally healed.    The patient does not have symptoms concerning for COVID-19 infection (fever, chills, cough, or new shortness of breath).    Past Medical History:  Diagnosis Date  . Cancer [facility])    left breast cancer  . Carotid artery occlusion   . Diabetes mellitus without complication (HCC)    diet controlled  . Family hx of colon cancer   . GERD (gastroesophageal reflux disease)   . High cholesterol   . History of gout   . Hypertension    for over 20 yrs  . Hypothyroid   . Neuropathy   . Pill dysphagia    Past Surgical History:  Procedure Laterality Date  . BACK SURGERY     5 rods and 5 screws and cage in back, has had 4 back surgeries.  Marland Kitchen BREAST LUMPECTOMY  left breast  . CATARACT EXTRACTION W/PHACO Left 12/31/2014   Procedure: CATARACT EXTRACTION PHACO AND INTRAOCULAR LENS PLACEMENT (IOC);  Surgeon: Tonny Chivas Notz, MD;  Location: [facility];  Service: Ophthalmology;  Laterality: Left;  CDE:8.45  . CATARACT EXTRACTION W/PHACO Right 01/28/2015   Procedure: CATARACT EXTRACTION PHACO AND INTRAOCULAR LENS PLACEMENT RIGHT EYE;  Surgeon: Tonny Adiva Boettner, MD;  Location: [facility];  Service: Ophthalmology;  Laterality: Right;  CDE:4.92  . CHOLECYSTECTOMY    . COLONOSCOPY N/A 10/21/2012    Procedure: COLONOSCOPY;  Surgeon: Rogene Houston, MD;  [facility];  Service: Endoscopy;  Laterality: N/A;  . ESOPHAGOGASTRODUODENOSCOPY (EGD) WITH ESOPHAGEAL DILATION N/A 06/09/2013   Procedure: ESOPHAGOGASTRODUODENOSCOPY (EGD) WITH ESOPHAGEAL DILATION;  Surgeon: Rogene Houston, MD;  [facility];  Service: Endoscopy;  Laterality: N/A;  125  . KNEE SURGERY Right    arthroscopy x2  . SPINE SURGERY     3 surgeries by Dr. Jenne Campus,  Last surgery 2 years ago by Dr. Carloyn Manner in [place]     No outpatient medications have been marked as taking for the 04/10/19 encounter (Appointment) with Arnoldo Lenis, MD.     Allergies:   Lipitor [atorvastatin], Penicillins, and Sulfa antibiotics   Social History   Tobacco Use  . Smoking status: Never Smoker  . Smokeless tobacco: Never Used  Substance Use Topics  . Alcohol use: No    Alcohol/week: 0.0 standard drinks  . Drug use: No     Family Hx: The patient's family history includes Colon cancer in her brother.  ROS:   Please see the history of present illness.     All other systems reviewed and are negative.   Prior CV studies:   The following studies were reviewed today:  09/2015 echo Study Conclusions  - Left ventricle: The cavity size was normal. Wall thickness was  increased in a pattern of mild LVH. Systolic function was normal.  The estimated ejection fraction was in the range of 60% to 65%.  Diastolic function is abnormal, indeterminate grade. Wall motion  was normal; there were no regional wall motion abnormalities. - Aortic valve: Mildly calcified annulus. Trileaflet; mildly  thickened leaflets. Valve area (VTI): 1.52 cm^2. Valve area  (Vmax): 1.43 cm^2. - Mitral valve: Mildly calcified annulus. Normal thickness leaflets  . - Technically adequate study.   11/2015 CTA neck IMPRESSION: 1. 75% stenosis of the left internal carotid artery at the bifurcation secondary to prominent posterior  lateral soft tissue neck calcified plaque. 2. Atherosclerotic calcifications at the right carotid bifurcation and origins the great vessels without other focal stenoses. 3. 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 4. Multilevel spondylosis of the cervical spine is most pronounced at C4-5 and C5-6.  Labs/Other Tests and Data Reviewed:    EKG:  No ECG reviewed.  Recent Labs: No results found for requested labs within last 8760 hours.   Recent Lipid Panel No results found for: CHOL, TRIG, HDL, CHOLHDL, LDLCALC, LDLDIRECT  Wt Readings from Last 3 Encounters:  03/01/19 197 lb (89.4 kg)  02/01/19 175 lb (79.4 kg)  01/18/19 175 lb (79.4 kg)     Objective:    Vital Signs:   Today's Vitals   04/10/19 1250  Weight: 172 lb (78 kg)  Height: 5' 5.5" (1.664 m)   Body mass index is 28.19 kg/m.  Normal affect. Normal speech pattern and tone. Comfortable, no apparent distress. No audible signs of SOB  or wheezing.   ASSESSMENT & PLAN:    1. HTN  - ongoing elevated bp's. Management complicated by medication side effects as reported above - we will stop lopressor, start labetalol 200mg  bid.   2. LE edema - continue lasix.   COVID-19 Education: The signs and symptoms of COVID-19 were discussed with the patient and how to seek care for testing (follow up with PCP or arrange E-visit).  The importance of social distancing was discussed today.  Time:   Today, I have spent 20 minutes with the patient with telehealth technology discussing the above problems.     Medication Adjustments/Labs and Tests Ordered: Current medicines are reviewed at length with the patient today.  Concerns regarding medicines are outlined above.   Tests Ordered: No orders of the defined types were placed in this encounter.   Medication Changes: No orders of the defined types were placed in this encounter.   Follow Up:  Virtual Visit in 1  month(s)  Signed, Carlyle Dolly, MD  04/10/2019 12:21 PM    [place]

## 2019-04-10 NOTE — Patient Instructions (Addendum)
Medication Instructions:   Your physician has recommended you make the following change in your medication:   Stop metoprolol succinate  Start labetalol 200 mg by mouth twice daily  Continue all other medications the same  Labwork:  NONE  Testing/Procedures:  NONE  Follow-Up:  Your physician recommends that you schedule a follow-up appointment in: 1 month.  Any Other Special Instructions Will Be Listed Below (If Applicable).  If you need a refill on your cardiac medications before your next appointment, please call your pharmacy.

## 2019-04-12 DIAGNOSIS — Z6830 Body mass index (BMI) 30.0-30.9, adult: Secondary | ICD-10-CM | POA: Diagnosis not present

## 2019-04-12 DIAGNOSIS — M1711 Unilateral primary osteoarthritis, right knee: Secondary | ICD-10-CM | POA: Diagnosis not present

## 2019-04-12 DIAGNOSIS — Z299 Encounter for prophylactic measures, unspecified: Secondary | ICD-10-CM | POA: Diagnosis not present

## 2019-04-12 DIAGNOSIS — I10 Essential (primary) hypertension: Secondary | ICD-10-CM | POA: Diagnosis not present

## 2019-05-09 ENCOUNTER — Telehealth: Payer: Self-pay | Admitting: Cardiology

## 2019-05-09 NOTE — Telephone Encounter (Signed)
Returned pt's sons call. He states that since his mother's medication change on 9/21 her blood pressure has been low. (104/45) is a constant. She is very dizzy and feels off. Her son asks if the dose could be decreased. Please advise.

## 2019-05-09 NOTE — Telephone Encounter (Signed)
Please give pt's son Letitia Libra a call concerning medications -- [phone]

## 2019-05-10 DIAGNOSIS — R5383 Other fatigue: Secondary | ICD-10-CM | POA: Diagnosis not present

## 2019-05-10 DIAGNOSIS — Z79899 Other long term (current) drug therapy: Secondary | ICD-10-CM | POA: Diagnosis not present

## 2019-05-10 DIAGNOSIS — Z299 Encounter for prophylactic measures, unspecified: Secondary | ICD-10-CM | POA: Diagnosis not present

## 2019-05-10 DIAGNOSIS — Z7189 Other specified counseling: Secondary | ICD-10-CM | POA: Diagnosis not present

## 2019-05-10 DIAGNOSIS — E7800 Pure hypercholesterolemia, unspecified: Secondary | ICD-10-CM | POA: Diagnosis not present

## 2019-05-10 DIAGNOSIS — E559 Vitamin D deficiency, unspecified: Secondary | ICD-10-CM | POA: Diagnosis not present

## 2019-05-10 DIAGNOSIS — Z1211 Encounter for screening for malignant neoplasm of colon: Secondary | ICD-10-CM | POA: Diagnosis not present

## 2019-05-10 DIAGNOSIS — Z1339 Encounter for screening examination for other mental health and behavioral disorders: Secondary | ICD-10-CM | POA: Diagnosis not present

## 2019-05-10 DIAGNOSIS — Z6832 Body mass index (BMI) 32.0-32.9, adult: Secondary | ICD-10-CM | POA: Diagnosis not present

## 2019-05-10 DIAGNOSIS — R350 Frequency of micturition: Secondary | ICD-10-CM | POA: Diagnosis not present

## 2019-05-10 DIAGNOSIS — E1122 Type 2 diabetes mellitus with diabetic chronic kidney disease: Secondary | ICD-10-CM | POA: Diagnosis not present

## 2019-05-10 DIAGNOSIS — Z0000 Encounter for general adult medical examination without abnormal findings: Secondary | ICD-10-CM | POA: Diagnosis not present

## 2019-05-10 DIAGNOSIS — I10 Essential (primary) hypertension: Secondary | ICD-10-CM | POA: Diagnosis not present

## 2019-05-10 DIAGNOSIS — Z1331 Encounter for screening for depression: Secondary | ICD-10-CM | POA: Diagnosis not present

## 2019-05-10 MED ORDER — LABETALOL HCL 100 MG PO TABS: 100.0000 mg | ORAL_TABLET | Freq: Two times a day (BID) | ORAL | 6 refills | Status: DC

## 2019-05-10 NOTE — Telephone Encounter (Signed)
Can lower labetalol to 100mg  bid, update [place] again early next week   Zandra Abts MD

## 2019-05-10 NOTE — Telephone Encounter (Signed)
Rhonda Mejia notified and voiced understanding

## 2019-05-11 NOTE — Telephone Encounter (Signed)
Please give Myra @ [facility] a call concerning pt's Labetalol   [phone]

## 2019-05-11 NOTE — Telephone Encounter (Signed)
Spoke with Myra at [facility] and aware that son had called about pt symptoms and BP per previous notes and labetalol was decreased 100 mg bid

## 2019-05-12 ENCOUNTER — Encounter: Payer: Self-pay | Admitting: Cardiology

## 2019-05-12 ENCOUNTER — Telehealth (INDEPENDENT_AMBULATORY_CARE_PROVIDER_SITE_OTHER): Payer: Medicare Other | Admitting: Cardiology

## 2019-05-12 VITALS — BP 129/52 | HR 56 | Ht 65.5 in | Wt 197.0 lb

## 2019-05-12 DIAGNOSIS — R600 Localized edema: Secondary | ICD-10-CM

## 2019-05-12 DIAGNOSIS — I10 Essential (primary) hypertension: Secondary | ICD-10-CM

## 2019-05-12 NOTE — Patient Instructions (Signed)
Medication Instructions:  Your physician recommends that you continue on your current medications as directed. Please refer to the Current Medication list given to you today.  *If you need a refill on your cardiac medications before your next appointment, please call your pharmacy*  Lab Work: NONE  If you have labs (blood work) drawn today and your tests are completely normal, you will receive your results only by: Marland Kitchen MyChart Message (if you have MyChart) OR . A paper copy in the mail If you have any lab test that is abnormal or we need to change your treatment, we will call you to review the results.  Testing/Procedures: NONE   Follow-Up: At [facility], you and your health needs are our priority.  As part of our continuing mission to provide you with exceptional heart care, we have created designated Provider Care Teams.  These Care Teams include your primary Cardiologist (physician) and Advanced Practice Providers (APPs -  Physician Assistants and Nurse Practitioners) who all work together to provide you with the care you need, when you need it.  Your next appointment:   4 months  The format for your next appointment:   Virtual Visit   Provider:   Carlyle Dolly, MD  Other Instructions Thank you for choosing [facility]!

## 2019-05-12 NOTE — Progress Notes (Signed)
Virtual Visit via Telephone Note   This visit type was conducted due to national recommendations for restrictions regarding the COVID-19 Pandemic (e.g. social distancing) in an effort to limit this patient's exposure and mitigate transmission in our community.  Due to her co-morbid illnesses, this patient is at least at moderate risk for complications without adequate follow up.  This format is felt to be most appropriate for this patient at this time.  The patient did not have access to video technology/had technical difficulties with video requiring transitioning to audio format only (telephone).  All issues noted in this document were discussed and addressed.  No physical exam could be performed with this format.  Please refer to the patient's chart for her  consent to telehealth for [facility].   Date:  05/12/2019   ID:  Rhonda Mejia, DOB 1929-03-18, MRN [id number]  Patient Location: Home Provider Location: Office  PCP:  Monico Blitz, MD  Cardiologist:  Carlyle Dolly, MD  Electrophysiologist:  None   Evaluation Performed:  Follow-Up Visit  Chief Complaint:  Follow up visit  History of Present Illness:    Rhonda Mejia is a 85 y.o. female seen today for follow up of the following medical problems. This is a focused visit on her history of HTN and LE edema, for more detailed history please refer to prior clinic notes.   1. HTN - side effects on higher doses of norvasc, with LE edema and some fatigue.  - last visit due to high SBPs in the 150s-160s we started labetaolol - on labetalol was having some low bp's, fatigue. We lowered the dose  to 100mg  bid, early on since change bp's are improving and fatigue resolving. .  - most recently bp 129/52 and HR 56 with lower labeatlol  2. LE edema - echo 02/2015 difficult study ,with LVEF 50-55%, normal RV function. Diastolic function not described.  - norvasc stopped due to LE edema. Lasix increased to 40mg  daily. Edema  has improved  - repeat echo 09/2015 LVEF 60-65%, abnormal diastolic function grade indeterminate   - followed by vascular for chronic venous insufficiency -unable to wearcompression stockings due to ankle fracture   - swelling much improved. Takes lasix 20-40mg  as needed   The patient does not have symptoms concerning for COVID-19 infection (fever, chills, cough, or new shortness of breath).    Past Medical History:  Diagnosis Date  . Cancer [facility])    left breast cancer  . Carotid artery occlusion   . Diabetes mellitus without complication (HCC)    diet controlled  . Family hx of colon cancer   . GERD (gastroesophageal reflux disease)   . High cholesterol   . History of gout   . Hypertension    for over 20 yrs  . Hypothyroid   . Neuropathy   . Pill dysphagia    Past Surgical History:  Procedure Laterality Date  . BACK SURGERY     5 rods and 5 screws and cage in back, has had 4 back surgeries.  Marland Kitchen BREAST LUMPECTOMY     left breast  . CATARACT EXTRACTION W/PHACO Left 12/31/2014   Procedure: CATARACT EXTRACTION PHACO AND INTRAOCULAR LENS PLACEMENT (IOC);  Surgeon: Tonny Adriann Ballweg, MD;  Location: [facility] OR[facility];  Service: Ophthalmology;  Laterality: Left;  CDE:8.45  . CATARACT EXTRACTION W/PHACO Right 01/28/2015   Procedure: CATARACT EXTRACTION PHACO AND INTRAOCULAR LENS PLACEMENT RIGHT EYE;  Surgeon: Tonny Oumou Smead, MD;  Location: [facility] ORS;  Service: Ophthalmology;  Laterality: Right;  CDE:4.92  . CHOLECYSTECTOMY    . COLONOSCOPY N/A 10/21/2012   Procedure: COLONOSCOPY;  Surgeon: Rogene Houston, MD;  Location: [facility] END[facility] SU[facility];  Service: Endoscopy;  Laterality: N/A;  . ESOPHAGOGASTRODUODENOSCOPY (EGD) WITH ESOPHAGEAL DILATION N/A 06/09/2013   Procedure: ESOPHAGOGASTRODUODENOSCOPY (EGD) WITH ESOPHAGEAL DILATION;  Surgeon: Rogene Houston, MD;  Location: [facility];  Service: Endoscopy;  Laterality: N/A;  125  . KNEE SURGERY Right    arthroscopy x2  . SPINE SURGERY     3 surgeries by Dr.  Jenne Campus,  Last surgery 2 years ago by Dr. Carloyn Manner in [place]  Medication Sig  . allopurinol (ZYLOPRIM) 300 MG tablet Take 300 mg by mouth daily.  . AMITIZA 24 MCG capsule Take 24 mcg by mouth daily as needed.   Marland Kitchen amLODipine (NORVASC) 5 MG tablet   . aspirin EC 81 MG tablet Take 81 mg by mouth daily.  . Cyanocobalamin (B-12) 2500 MCG TABS Take 2 tablets by mouth daily.   . folic acid (FOLVITE) 1 MG tablet Take 1 mg by mouth daily.  . furosemide (LASIX) 20 MG tablet Take 1-3 tablets by mouth daily. Per Dr. Manuella Ghazi  . gabapentin (NEURONTIN) 300 MG capsule Take 300 mg by mouth 4 (four) times daily.   . hydrALAZINE (APRESOLINE) 100 MG tablet Take 1 tablet (100 mg total) by mouth 3 (three) times daily.  . hydrocortisone 2.5 % ointment APPLY A SMALL AMOUNT TO ITCHY PATCHES ON FACE TWICE DAILY  . ibuprofen (ADVIL,MOTRIN) 800 MG tablet TAKE ONE TABLET BY MOUTH EVERY 8 HOURS AS NEEDED WITH FOOD  . labetalol (NORMODYNE) 100 MG tablet Take 1 tablet (100 mg total) by mouth 2 (two) times daily.  Marland Kitchen levothyroxine (SYNTHROID) 112 MCG tablet Take 112 mcg by mouth daily.  Marland Kitchen lisinopril (PRINIVIL,ZESTRIL) 40 MG tablet Take 40 mg by mouth daily.  . Omega-3 Fatty Acids (FISH OIL PO) Take 1 capsule by mouth daily.  Marland Kitchen omeprazole (PRILOSEC) 40 MG capsule Take 40 mg by mouth daily.  Marland Kitchen pyridOXINE (VITAMIN B-6) 100 MG tablet Take 100 mg by mouth daily.  . traMADol (ULTRAM) 50 MG tablet Take 100 mg by mouth 2 (two) times daily.   Marland Kitchen triamcinolone cream (KENALOG) 0.1 % APPLY TO ITCHY SKIN DAILY (DO NOT USE ON FACE)  . venlafaxine XR (EFFEXOR-XR) 75 MG 24 hr capsule Take 1 capsule by mouth daily.     Allergies:   Lipitor [atorvastatin], Penicillins, and Sulfa antibiotics   Social History   Tobacco Use  . Smoking status: Never Smoker  . Smokeless tobacco: Never Used  Substance Use Topics  . Alcohol use: No    Alcohol/week: 0.0 standard drinks  . Drug use: No     Family Hx: The patient's family  history includes Colon cancer in her brother.  ROS:   Please see the history of present illness.     All other systems reviewed and are negative.   Prior CV studies:   The following studies were reviewed today:  09/2015 echo Study Conclusions  - Left ventricle: The cavity size was normal. Wall thickness was  increased in a pattern of mild LVH. Systolic function was normal.  The estimated ejection fraction was in the range of 60% to 65%.  Diastolic function is abnormal, indeterminate grade. Wall motion  was normal; there were no regional wall motion abnormalities. - Aortic valve: Mildly calcified annulus. Trileaflet; mildly  thickened leaflets. Valve area (VTI): 1.52 cm^2. Valve area  (  Vmax): 1.43 cm^2. - Mitral valve: Mildly calcified annulus. Normal thickness leaflets  . - Technically adequate study.   11/2015 CTA neck IMPRESSION: 1. 75% stenosis of the left internal carotid artery at the bifurcation secondary to prominent posterior lateral soft tissue neck calcified plaque. 2. Atherosclerotic calcifications at the right carotid bifurcation and origins the great vessels without other focal stenoses. 3. 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 4. Multilevel spondylosis of the cervical spine is most pronounced at C4-5 and C5-6.  Labs/Other Tests and Data Reviewed:    EKG:  No ECG reviewed.  Recent Labs: No results found for requested labs within last 8760 hours.   Recent Lipid Panel No results found for: CHOL, TRIG, HDL, CHOLHDL, LDLCALC, LDLDIRECT  Wt Readings from Last 3 Encounters:  05/12/19 197 lb (89.4 kg)  04/10/19 172 lb (78 kg)  03/01/19 197 lb (89.4 kg)     Objective:    Vital Signs:  BP (!) 129/52   Pulse (!) 56   Ht 5' 5.5" (1.664 m)   Wt 197 lb (89.4 kg)   BMI 32.28 kg/m    Normal affect. Normal speech pattern and tone. Comfortable, no apparent distress. No audible signs of  SOB or wheezing.   ASSESSMENT & PLAN:    1. HTN  -at goal, low bp's have resolved with lower dosing of labetalol. Continue to monitor.   2. LE edema - controlled, continue current diuretic.   COVID-19 Education: The signs and symptoms of COVID-19 were discussed with the patient and how to seek care for testing (follow up with PCP or arrange E-visit).  The importance of social distancing was discussed today.  Time:   Today, I have spent 13 minutes with the patient with telehealth technology discussing the above problems.     Medication Adjustments/Labs and Tests Ordered: Current medicines are reviewed at length with the patient today.  Concerns regarding medicines are outlined above.   Tests Ordered: No orders of the defined types were placed in this encounter.   Medication Changes: No orders of the defined types were placed in this encounter.   Follow Up:  Virtual Visit  in 4 month(s)  Signed, Carlyle Dolly, MD  05/12/2019 10:11 AM    [place]

## 2019-05-16 ENCOUNTER — Other Ambulatory Visit (INDEPENDENT_AMBULATORY_CARE_PROVIDER_SITE_OTHER): Payer: Self-pay | Admitting: Orthopaedic Surgery

## 2019-05-16 NOTE — Telephone Encounter (Signed)
Ok to refill 

## 2019-05-17 ENCOUNTER — Other Ambulatory Visit: Payer: Self-pay | Admitting: Cardiology

## 2019-05-17 DIAGNOSIS — I10 Essential (primary) hypertension: Secondary | ICD-10-CM | POA: Diagnosis not present

## 2019-05-17 DIAGNOSIS — Z299 Encounter for prophylactic measures, unspecified: Secondary | ICD-10-CM | POA: Diagnosis not present

## 2019-05-17 DIAGNOSIS — E559 Vitamin D deficiency, unspecified: Secondary | ICD-10-CM | POA: Diagnosis not present

## 2019-05-17 DIAGNOSIS — K219 Gastro-esophageal reflux disease without esophagitis: Secondary | ICD-10-CM | POA: Diagnosis not present

## 2019-05-17 DIAGNOSIS — Z6834 Body mass index (BMI) 34.0-34.9, adult: Secondary | ICD-10-CM | POA: Diagnosis not present

## 2019-05-17 DIAGNOSIS — D649 Anemia, unspecified: Secondary | ICD-10-CM | POA: Diagnosis not present

## 2019-05-17 DIAGNOSIS — E1165 Type 2 diabetes mellitus with hyperglycemia: Secondary | ICD-10-CM | POA: Diagnosis not present

## 2019-05-24 ENCOUNTER — Other Ambulatory Visit: Payer: Self-pay | Admitting: *Deleted

## 2019-05-24 ENCOUNTER — Other Ambulatory Visit: Payer: Self-pay | Admitting: Cardiology

## 2019-05-24 MED ORDER — AMLODIPINE BESYLATE 5 MG PO TABS: ORAL_TABLET | ORAL | 0 refills | Status: DC

## 2019-05-25 DIAGNOSIS — H353110 Nonexudative age-related macular degeneration, right eye, stage unspecified: Secondary | ICD-10-CM | POA: Diagnosis not present

## 2019-06-07 ENCOUNTER — Ambulatory Visit (INDEPENDENT_AMBULATORY_CARE_PROVIDER_SITE_OTHER): Payer: Medicare Other | Admitting: Orthopaedic Surgery

## 2019-06-07 DIAGNOSIS — M170 Bilateral primary osteoarthritis of knee: Secondary | ICD-10-CM | POA: Diagnosis not present

## 2019-06-07 DIAGNOSIS — I6523 Occlusion and stenosis of bilateral carotid arteries: Secondary | ICD-10-CM

## 2019-06-07 MED ORDER — METHYLPREDNISOLONE ACETATE 40 MG/ML IJ SUSP
80.0000 mg | INTRAMUSCULAR | Status: AC | PRN
  Administered 2019-06-07: 14:00:00 80 mg via INTRA_ARTICULAR

## 2019-06-07 MED ORDER — LIDOCAINE HCL 1 % IJ SOLN
2.0000 mL | INTRAMUSCULAR | Status: AC | PRN
  Administered 2019-06-07: 2 mL

## 2019-06-07 MED ORDER — BUPIVACAINE HCL 0.5 % IJ SOLN
2.0000 mL | INTRAMUSCULAR | Status: AC | PRN
  Administered 2019-06-07: 2 mL via INTRA_ARTICULAR

## 2019-06-07 NOTE — Progress Notes (Signed)
Office Visit Note   Patient: Rhonda Mejia           Date of Birth: [birth year]           MRN: [id number] Visit Date: 06/07/2019              Requested by: Monico Blitz, MD [address] [place],  [place] [address] PCP: Monico Blitz, MD   Assessment & Plan: Visit Diagnoses:  1. Bilateral primary osteoarthritis of knee     Plan: Cortisone injection left knee.  Return in 2 weeks to inject right knee  Follow-Up Instructions: Return in about 2 weeks (around 06/21/2019).   Orders:  Orders Placed This Encounter  Procedures  . Large Joint Inj: L knee   No orders of the defined types were placed in this encounter.     Procedures: Large Joint Inj: L knee on 06/07/2019 1:54 PM Indications: pain and diagnostic evaluation Details: 25 G 1.5 in needle, anterolateral approach  Arthrogram: No  Medications: 2 mL lidocaine 1 %; 2 mL bupivacaine 0.5 %; 80 mg methylPREDNISolone acetate 40 MG/ML Procedure, treatment alternatives, risks and benefits explained, specific risks discussed. Consent was given by the patient. Patient was prepped and draped in the usual sterile fashion.       Clinical Data: No additional findings.   Subjective: No chief complaint on file. Rhonda Mejia is experiencing recurrent symptoms of bilateral knee pain.  Having more trouble on the left.  Has had prior films demonstrating significant end-stage osteoarthritis predominantly lateral compartment with near bone-on-bone  HPI  Review of Systems   Objective: Vital Signs: There were no vitals taken for this visit.  Physical Exam Constitutional:      Appearance: She is well-developed.  Eyes:     Pupils: Pupils are equal, round, and reactive to light.  Pulmonary:     Effort: Pulmonary effort is normal.  Skin:    General: Skin is warm and dry.  Neurological:     Mental Status: She is alert and oriented to person, place, and time.  Psychiatric:        Behavior: Behavior normal.     Ortho Exam uses a  cane to aid with her ambulation.  Probably small effusions of both knees.  Increased valgus with weightbearing both knees.  More trouble on the left than the right with mostly lateral joint pain but was some patellar crepitation.  Full extension bilaterally and flex about 105 degrees without instability.  Mild nonpitting ankle edema bilaterally  Specialty Comments:  No specialty comments available.  Imaging: No results found.   PMFS History: Patient Active Problem List   Diagnosis Date Noted  . Bilateral primary osteoarthritis of knee 11/03/2017  . Dysphagia, unspecified(787.20) 06/07/2013  . Unspecified hypothyroidism 10/10/2012  . High cholesterol 10/10/2012  . Essential hypertension, benign 10/10/2012  . Rectal bleeding 10/10/2012  . Pill dysphagia 10/10/2012  . Family hx of colon cancer 10/10/2012  . Anemia 10/10/2012   Past Medical History:  Diagnosis Date  . Cancer [facility])    left breast cancer  . Carotid artery occlusion   . Diabetes mellitus without complication (HCC)    diet controlled  . Family hx of colon cancer   . GERD (gastroesophageal reflux disease)   . High cholesterol   . History of gout   . Hypertension    for over 20 yrs  . Hypothyroid   . Neuropathy   . Pill dysphagia     Family History  Problem Relation Age  of Onset  . Colon cancer Brother     Past Surgical History:  Procedure Laterality Date  . BACK SURGERY     5 rods and 5 screws and cage in back, has had 4 back surgeries.  Marland Kitchen BREAST LUMPECTOMY     left breast  . CATARACT EXTRACTION W/PHACO Left 12/31/2014   Procedure: CATARACT EXTRACTION PHACO AND INTRAOCULAR LENS PLACEMENT (IOC);  Surgeon: Tonny Branch, MD;  Location: [facility];  Service: Ophthalmology;  Laterality: Left;  CDE:8.45  . CATARACT EXTRACTION W/PHACO Right 01/28/2015   Procedure: CATARACT EXTRACTION PHACO AND INTRAOCULAR LENS PLACEMENT RIGHT EYE;  Surgeon: Tonny Branch, MD;  Location: [facility] ORS;  Service: Ophthalmology;  Laterality: Right;   CDE:4.92  . CHOLECYSTECTOMY    . COLONOSCOPY N/A 10/21/2012   Procedure: COLONOSCOPY;  Surgeon: Rogene Houston, MD;  Location: [facility];  Service: Endoscopy;  Laterality: N/A;  . ESOPHAGOGASTRODUODENOSCOPY (EGD) WITH ESOPHAGEAL DILATION N/A 06/09/2013   Procedure: ESOPHAGOGASTRODUODENOSCOPY (EGD) WITH ESOPHAGEAL DILATION;  Surgeon: Rogene Houston, MD;  [facility];  Service: Endoscopy;  Laterality: N/A;  125  . KNEE SURGERY Right    arthroscopy x2  . SPINE SURGERY     3 surgeries by Dr. Jenne Campus,  Last surgery 2 years ago by Dr. Carloyn Manner in [place] History  . Not on file  Tobacco Use  . Smoking status: Never Smoker  . Smokeless tobacco: Never Used  Substance and Sexual Activity  . Alcohol use: No    Alcohol/week: 0.0 standard drinks  . Drug use: No  . Sexual activity: Yes    Birth control/protection: Post-menopausal     Garald Balding, MD   Note - This record has been created using [employer].  Chart creation errors have been sought, but may not always  have been located. Such creation errors do not reflect on  the standard of medical care.

## 2019-06-12 DIAGNOSIS — T148XXA Other injury of unspecified body region, initial encounter: Secondary | ICD-10-CM | POA: Diagnosis not present

## 2019-06-12 DIAGNOSIS — E039 Hypothyroidism, unspecified: Secondary | ICD-10-CM | POA: Diagnosis not present

## 2019-06-12 DIAGNOSIS — E1165 Type 2 diabetes mellitus with hyperglycemia: Secondary | ICD-10-CM | POA: Diagnosis not present

## 2019-06-12 DIAGNOSIS — Z299 Encounter for prophylactic measures, unspecified: Secondary | ICD-10-CM | POA: Diagnosis not present

## 2019-06-12 DIAGNOSIS — I10 Essential (primary) hypertension: Secondary | ICD-10-CM | POA: Diagnosis not present

## 2019-06-12 DIAGNOSIS — Z6832 Body mass index (BMI) 32.0-32.9, adult: Secondary | ICD-10-CM | POA: Diagnosis not present

## 2019-06-21 ENCOUNTER — Ambulatory Visit: Payer: Medicare Other | Admitting: Orthopaedic Surgery

## 2019-06-23 DIAGNOSIS — I10 Essential (primary) hypertension: Secondary | ICD-10-CM | POA: Diagnosis not present

## 2019-06-23 DIAGNOSIS — E119 Type 2 diabetes mellitus without complications: Secondary | ICD-10-CM | POA: Diagnosis not present

## 2019-06-27 DIAGNOSIS — E785 Hyperlipidemia, unspecified: Secondary | ICD-10-CM | POA: Diagnosis present

## 2019-06-27 DIAGNOSIS — N189 Chronic kidney disease, unspecified: Secondary | ICD-10-CM | POA: Diagnosis present

## 2019-06-27 DIAGNOSIS — D649 Anemia, unspecified: Secondary | ICD-10-CM | POA: Diagnosis not present

## 2019-06-27 DIAGNOSIS — E039 Hypothyroidism, unspecified: Secondary | ICD-10-CM | POA: Diagnosis present

## 2019-06-27 DIAGNOSIS — Z6833 Body mass index (BMI) 33.0-33.9, adult: Secondary | ICD-10-CM | POA: Diagnosis not present

## 2019-06-27 DIAGNOSIS — N179 Acute kidney failure, unspecified: Secondary | ICD-10-CM | POA: Diagnosis present

## 2019-06-27 DIAGNOSIS — F339 Major depressive disorder, recurrent, unspecified: Secondary | ICD-10-CM | POA: Diagnosis not present

## 2019-06-27 DIAGNOSIS — E669 Obesity, unspecified: Secondary | ICD-10-CM | POA: Diagnosis present

## 2019-06-27 DIAGNOSIS — R41841 Cognitive communication deficit: Secondary | ICD-10-CM | POA: Diagnosis not present

## 2019-06-27 DIAGNOSIS — M7989 Other specified soft tissue disorders: Secondary | ICD-10-CM | POA: Diagnosis not present

## 2019-06-27 DIAGNOSIS — Z20828 Contact with and (suspected) exposure to other viral communicable diseases: Secondary | ICD-10-CM | POA: Diagnosis present

## 2019-06-27 DIAGNOSIS — M542 Cervicalgia: Secondary | ICD-10-CM | POA: Diagnosis not present

## 2019-06-27 DIAGNOSIS — Z7982 Long term (current) use of aspirin: Secondary | ICD-10-CM | POA: Diagnosis not present

## 2019-06-27 DIAGNOSIS — M6281 Muscle weakness (generalized): Secondary | ICD-10-CM | POA: Diagnosis not present

## 2019-06-27 DIAGNOSIS — E1140 Type 2 diabetes mellitus with diabetic neuropathy, unspecified: Secondary | ICD-10-CM | POA: Diagnosis present

## 2019-06-27 DIAGNOSIS — I130 Hypertensive heart and chronic kidney disease with heart failure and stage 1 through stage 4 chronic kidney disease, or unspecified chronic kidney disease: Secondary | ICD-10-CM | POA: Diagnosis present

## 2019-06-27 DIAGNOSIS — R0902 Hypoxemia: Secondary | ICD-10-CM | POA: Diagnosis not present

## 2019-06-27 DIAGNOSIS — M79604 Pain in right leg: Secondary | ICD-10-CM | POA: Diagnosis not present

## 2019-06-27 DIAGNOSIS — R2689 Other abnormalities of gait and mobility: Secondary | ICD-10-CM | POA: Diagnosis not present

## 2019-06-27 DIAGNOSIS — I5033 Acute on chronic diastolic (congestive) heart failure: Secondary | ICD-10-CM | POA: Diagnosis present

## 2019-06-27 DIAGNOSIS — I509 Heart failure, unspecified: Secondary | ICD-10-CM | POA: Diagnosis not present

## 2019-06-27 DIAGNOSIS — Z853 Personal history of malignant neoplasm of breast: Secondary | ICD-10-CM | POA: Diagnosis not present

## 2019-06-27 DIAGNOSIS — Z880 Allergy status to penicillin: Secondary | ICD-10-CM | POA: Diagnosis not present

## 2019-06-27 DIAGNOSIS — S8011XA Contusion of right lower leg, initial encounter: Secondary | ICD-10-CM | POA: Diagnosis present

## 2019-06-27 DIAGNOSIS — F329 Major depressive disorder, single episode, unspecified: Secondary | ICD-10-CM | POA: Diagnosis present

## 2019-06-27 DIAGNOSIS — K219 Gastro-esophageal reflux disease without esophagitis: Secondary | ICD-10-CM | POA: Diagnosis present

## 2019-06-27 DIAGNOSIS — Z9181 History of falling: Secondary | ICD-10-CM | POA: Diagnosis not present

## 2019-06-27 DIAGNOSIS — E1122 Type 2 diabetes mellitus with diabetic chronic kidney disease: Secondary | ICD-10-CM | POA: Diagnosis present

## 2019-06-27 DIAGNOSIS — M109 Gout, unspecified: Secondary | ICD-10-CM | POA: Diagnosis present

## 2019-06-28 ENCOUNTER — Telehealth: Payer: Self-pay | Admitting: Cardiology

## 2019-06-28 DIAGNOSIS — I509 Heart failure, unspecified: Secondary | ICD-10-CM | POA: Diagnosis not present

## 2019-06-28 DIAGNOSIS — R0902 Hypoxemia: Secondary | ICD-10-CM | POA: Diagnosis not present

## 2019-06-28 NOTE — Telephone Encounter (Signed)
Spoke with son Donnie (POA). Son is requesting that Dr. Harl Bowie review pt's chart from [facility] and make recommendations. Son informed that Dr. Harl Bowie does not go to [facility] to see pt's. Son voiced understanding and stated that he still wanted Dr. Harl Bowie to review the chart and make recommendations. Please advise.

## 2019-06-28 NOTE — Telephone Encounter (Signed)
I don't have access to her chart at [facility]. Can we try to see if there is a history and physical available to be sent over. What is the son's understanding of whats going on?   Zandra Abts MD

## 2019-06-28 NOTE — Telephone Encounter (Signed)
Please give pt's son Rhonda Mejia a call @ [phone]  Pt is currently admitted at [facility] and he's wanting Dr. Harl Bowie to be made aware of what is going on and wants to know if there is anything Dr. Harl Bowie can do to help her.

## 2019-06-28 NOTE — Telephone Encounter (Signed)
Returned son's call. NA left msg to return call.

## 2019-06-29 NOTE — Telephone Encounter (Signed)
Please give pt's son Letitia Libra a call @ [phone]

## 2019-07-04 ENCOUNTER — Encounter: Payer: Self-pay | Admitting: *Deleted

## 2019-07-04 NOTE — Telephone Encounter (Signed)
Pt's son would like to know if records from [facility] have been received   Please call Letitia Libra @[phone]

## 2019-07-05 NOTE — Telephone Encounter (Signed)
Pt's son Letitia Libra has called requesting for Dr. Harl Bowie to review pt's records from [facility], records were sent to the [facility] office yesterday for his review.   Please call Donnie @ [phone]

## 2019-07-05 NOTE — Telephone Encounter (Signed)
Son notified that records have been rec'd and are being reviewed.

## 2019-07-05 NOTE — Telephone Encounter (Signed)
Returned son's call. No answer. Left msg to call back.

## 2019-07-06 ENCOUNTER — Other Ambulatory Visit: Payer: Self-pay | Admitting: *Deleted

## 2019-07-06 NOTE — Patient Outreach (Signed)
Screened for potential [facility] Care Management needs as a benefit of  [employer].  Rhonda Mejia Is currently receiving skilled therapy at [facility].  Writer attended telephonic interdisciplinary team meeting to assess for disposition needs and transition plan for resident.   Rhonda Mejia just recently admitted to facility on 07/05/19.   Will continue to follow for potential [facility] Care Management needs.    Rhonda Rolling, MSN-Ed, RN,BSN [place] Acute Care Coordinator [phone] [facility]) [phone]  (Toll free office)

## 2019-07-11 NOTE — Telephone Encounter (Signed)
Pt's son Donnie left a voicemail requesting that Dr. Harl Bowie please look over his Mothers records from [facility] and please give him a call-- he has a few questions he would like to discuss with Dr. Harl Bowie.   [phone]

## 2019-07-11 NOTE — Telephone Encounter (Signed)
Will await Dr. Nelly Laurence return so that he can review records and get in contact with son.

## 2019-07-11 NOTE — Telephone Encounter (Signed)
Will forward to Dr. Branch. 

## 2019-07-20 ENCOUNTER — Telehealth: Payer: Self-pay | Admitting: Cardiology

## 2019-07-20 NOTE — Telephone Encounter (Addendum)
Extended phon call with patients's son Letitia Libra today regarding patients recent admission to [facility] and her ongoing medical issues. Donnie is the patient's POA.    Recent fall at [facility] in [place] around Thanksgiving. Unsure what led to the fall. Multiple falls over the last several months, unclear history surrounding per family  From [facility] records admitted early 06/2019 with fatigue and weakness. She reported some falls at home as well. In ER O2sats 88%.  acute on chronic diastolic HF as well as symptomatic anemia requiring transfusion. From notes heme neg stool.   Admission was complicated by AKI. Diuresed with IV lasix. The dicharge summary does not include a diuretic at discharge.    Admission Labs: pro BNP 11209 Cr 1.78 Hgb 6.7 Trop 0.06 ABG 7.46/38/61 CT head: no acute process.  CXR cardiomegaly, pulm congestion RLE Venous US: no DVT EKG SR, LBBB 06/2019 echo: LVEF 50-55%, grade II diastolic dysfunction, normal RV, mod BAE, mild AS, mild to mod MR,    Discharge labs 12/16: Cr 1.14 BUN 45 Hgb 9.9  Discharged to [facility] rehab. Family reports multiple changes in medications including heart medicines at discharge they have questions about and want to be addressed at her upcoming f/u.     At our appointment next week will need to go through current medicatin list at rehab and compare to prior list, review vitals at rehab and see if cardiac meds need adjustment. Obtain labs as appears 12/22 patient had more recent labs. famiyl reportzs Hgb was 10.5 and Cr 1.05 by those labs.    Carlyle Dolly MD

## 2019-07-21 DIAGNOSIS — N179 Acute kidney failure, unspecified: Secondary | ICD-10-CM | POA: Diagnosis not present

## 2019-07-21 DIAGNOSIS — E1122 Type 2 diabetes mellitus with diabetic chronic kidney disease: Secondary | ICD-10-CM | POA: Diagnosis not present

## 2019-07-21 DIAGNOSIS — M6281 Muscle weakness (generalized): Secondary | ICD-10-CM | POA: Diagnosis not present

## 2019-07-21 DIAGNOSIS — I10 Essential (primary) hypertension: Secondary | ICD-10-CM | POA: Diagnosis not present

## 2019-07-21 DIAGNOSIS — R41841 Cognitive communication deficit: Secondary | ICD-10-CM | POA: Diagnosis not present

## 2019-07-21 DIAGNOSIS — F339 Major depressive disorder, recurrent, unspecified: Secondary | ICD-10-CM | POA: Diagnosis not present

## 2019-07-21 DIAGNOSIS — E1140 Type 2 diabetes mellitus with diabetic neuropathy, unspecified: Secondary | ICD-10-CM | POA: Diagnosis not present

## 2019-07-21 DIAGNOSIS — I5033 Acute on chronic diastolic (congestive) heart failure: Secondary | ICD-10-CM | POA: Diagnosis not present

## 2019-07-21 DIAGNOSIS — R2689 Other abnormalities of gait and mobility: Secondary | ICD-10-CM | POA: Diagnosis not present

## 2019-07-21 DIAGNOSIS — I130 Hypertensive heart and chronic kidney disease with heart failure and stage 1 through stage 4 chronic kidney disease, or unspecified chronic kidney disease: Secondary | ICD-10-CM | POA: Diagnosis not present

## 2019-07-21 DIAGNOSIS — D649 Anemia, unspecified: Secondary | ICD-10-CM | POA: Diagnosis not present

## 2019-07-21 DIAGNOSIS — N189 Chronic kidney disease, unspecified: Secondary | ICD-10-CM | POA: Diagnosis not present

## 2019-07-21 DIAGNOSIS — Z9181 History of falling: Secondary | ICD-10-CM | POA: Diagnosis not present

## 2019-07-23 ENCOUNTER — Encounter: Payer: Self-pay | Admitting: Orthopaedic Surgery

## 2019-07-24 ENCOUNTER — Encounter: Payer: Self-pay | Admitting: *Deleted

## 2019-07-24 ENCOUNTER — Telehealth (INDEPENDENT_AMBULATORY_CARE_PROVIDER_SITE_OTHER): Payer: Medicare Other | Admitting: Cardiology

## 2019-07-24 ENCOUNTER — Encounter: Payer: Self-pay | Admitting: Cardiology

## 2019-07-24 VITALS — BP 136/58 | HR 61 | Ht 65.5 in | Wt 186.0 lb

## 2019-07-24 DIAGNOSIS — I10 Essential (primary) hypertension: Secondary | ICD-10-CM

## 2019-07-24 DIAGNOSIS — R600 Localized edema: Secondary | ICD-10-CM

## 2019-07-24 NOTE — Patient Instructions (Signed)
Your physician recommends that you schedule a follow-up appointment in: 6 WEEKS WITH DR BRANCH  Your physician recommends that you continue on your current medications as directed. Please refer to the Current Medication list given to you today.  Thank you for choosing [facility]!!    

## 2019-07-24 NOTE — Progress Notes (Signed)
Virtual Visit via Telephone Note   This visit type was conducted due to national recommendations for restrictions regarding the COVID-19 Pandemic (e.g. social distancing) in an effort to limit this patient's exposure and mitigate transmission in our community.  Due to her co-morbid illnesses, this patient is at least at moderate risk for complications without adequate follow up.  This format is felt to be most appropriate for this patient at this time.  The patient did not have access to video technology/had technical difficulties with video requiring transitioning to audio format only (telephone).  All issues noted in this document were discussed and addressed.  No physical exam could be performed with this format.  Please refer to the patient's chart for her  consent to telehealth for [facility].   Date:  07/24/2019   ID:  Rhonda Mejia, DOB [birth year], MRN [id number]  Patient Location: [place] Provider location: Home  PCP:  Monico Blitz, MD  Cardiologist:  Carlyle Dolly, MD  Electrophysiologist:  None   Evaluation Performed:  Follow-Up Visit  Chief Complaint:  Follow up  History of Present Illness:    Rhonda Mejia is a 86 y.o. female seen today for follow up of the following medical problema.    1. HTN - side effects on higher doses of norvasc, with LE edema and some fatigue.  - last visit due to high SBPs in the 150s-160s we started labetaolol - on labetalol was having some low bp's, fatigue. We lowered the dose  to 100mg  bid, early on since change bp's are improving and fatigue resolving. .  - most recently bp 129/52 and HR 56 with lower labeatlol   - since admission off norvasc 2.5, hydralazine was lowered to 50mg  tid. Off labetalol, appears she may be on toprol instead - bp's at rehab: 136/58 last night, 130/70, 136/61, 118/78    2. LE edema - echo 02/2015 difficult study ,with LVEF 50-55%, normal RV function. Diastolic function not  described.  - norvasc stopped due to LE edema. Lasix increased to 40mg  daily. Edema has improved  - repeat echo 09/2015 LVEF 60-65%, abnormal diastolic function grade indeterminate  - followed by vascular for chronic venous insufficiency    - recent admission [facility] with fluid overload and anemia Admission Labs: pro BNP 11209 Cr 1.78 Hgb 6.7 Trop 0.06 ABG 7.46/38/61 CT head: no acute process.  CXR cardiomegaly, pulm congestion RLE Venous US: no DVT EKG SR, LBBB 06/2019 echo: LVEF 50-55%, grade II diastolic dysfunction, normal RV, mod BAE, mild AS, mild to mod MR,  12/22 patient had more recent labs. famiyl reportzs Hgb was 10.5 and Cr 1.05 by those labs.    3. Carotid stenosis - carotid US 09/2015 with concern for distal left CCA stenosis, 1-39% right ICA stenosis.  - 11/2015 CTA neck with [id number] LICA stenosis, focal contained dissecion left ICA, no significant RICA disease - followed by vascular. From there consultation CTA results thought to be misleading due to heavy calficications and fairly mild velocities by carotid US.       The patient does not have symptoms concerning for COVID-19 infection (fever, chills, cough, or new shortness of breath).    Past Medical History:  Diagnosis Date  . Cancer [facility])    left breast cancer  . Carotid artery occlusion   . Diabetes mellitus without complication (HCC)    diet controlled  . Family hx of colon cancer   . GERD (gastroesophageal reflux disease)   . High  cholesterol   . History of gout   . Hypertension    for over 20 yrs  . Hypothyroid   . Neuropathy   . Pill dysphagia    Past Surgical History:  Procedure Laterality Date  . BACK SURGERY     5 rods and 5 screws and cage in back, has had 4 back surgeries.  Marland Kitchen BREAST LUMPECTOMY     left breast  . CATARACT EXTRACTION W/PHACO Left 12/31/2014   Procedure: CATARACT EXTRACTION PHACO AND INTRAOCULAR LENS PLACEMENT (IOC);  Surgeon: Tonny Leveda Kendrix, MD;  Location: [facility] OR[facility];   Service: Ophthalmology;  Laterality: Left;  CDE:8.45  . CATARACT EXTRACTION W/PHACO Right 01/28/2015   Procedure: CATARACT EXTRACTION PHACO AND INTRAOCULAR LENS PLACEMENT RIGHT EYE;  Surgeon: Tonny Whitnee Orzel, MD;  Location: [facility] ORS;  Service: Ophthalmology;  Laterality: Right;  CDE:4.92  . CHOLECYSTECTOMY    . COLONOSCOPY N/A 10/21/2012   Procedure: COLONOSCOPY;  Surgeon: Rogene Houston, MD;  Location: [facility] ENDO SUITE;  Service: Endoscopy;  Laterality: N/A;  . ESOPHAGOGASTRODUODENOSCOPY (EGD) WITH ESOPHAGEAL DILATION N/A 06/09/2013   Procedure: ESOPHAGOGASTRODUODENOSCOPY (EGD) WITH ESOPHAGEAL DILATION;  Surgeon: Rogene Houston, MD;  Location: [facility];  Service: Endoscopy;  Laterality: N/A;  125  . KNEE SURGERY Right    arthroscopy x2  . SPINE SURGERY     3 surgeries by Dr. Jenne Campus,  Last surgery 2 years ago by Dr. Carloyn Manner in [place]     No outpatient medications have been marked as taking for the 07/24/19 encounter (Telemedicine) with Arnoldo Lenis, MD.     Allergies:   Crestor [rosuvastatin], Lipitor [atorvastatin], Mobic [meloxicam], Norvasc [amlodipine], Penicillins, and Sulfa antibiotics   Social History   Tobacco Use  . Smoking status: Never Smoker  . Smokeless tobacco: Never Used  Substance Use Topics  . Alcohol use: No    Alcohol/week: 0.0 standard drinks  . Drug use: No     Family Hx: The patient's family history includes Colon cancer in her brother.  ROS:   Please see the history of present illness.     All other systems reviewed and are negative.   Prior CV studies:   The following studies were reviewed today:  09/2015 echo Study Conclusions  - Left ventricle: The cavity size was normal. Wall thickness was  increased in a pattern of mild LVH. Systolic function was normal.  The estimated ejection fraction was in the range of 60% to 65%.  Diastolic function is abnormal, indeterminate grade. Wall motion  was normal; there were no regional wall motion  abnormalities. - Aortic valve: Mildly calcified annulus. Trileaflet; mildly  thickened leaflets. Valve area (VTI): 1.52 cm^2. Valve area  (Vmax): 1.43 cm^2. - Mitral valve: Mildly calcified annulus. Normal thickness leaflets  . - Technically adequate study.   11/2015 CTA neck IMPRESSION: 1. 75% stenosis of the left internal carotid artery at the bifurcation secondary to prominent posterior lateral soft tissue neck calcified plaque. 2. Atherosclerotic calcifications at the right carotid bifurcation and origins the great vessels without other focal stenoses. 3. 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 4. Multilevel spondylosis of the cervical spine is most pronounced at C4-5 and C5-6.  Labs/Other Tests and Data Reviewed:    EKG:  n/a  Recent Labs: No results found for requested labs within last 8760 hours.   Recent Lipid Panel No results found for: CHOL, TRIG, HDL, CHOLHDL, LDLCALC, LDLDIRECT  Wt Readings from Last 3  Encounters:  07/24/19 186 lb (84.4 kg)  05/12/19 197 lb (89.4 kg)  04/10/19 172 lb (78 kg)     Objective:    Vital Signs:  BP (!) 136/58   Pulse 61   Ht 5' 5.5" (1.664 m)   Wt 186 lb (84.4 kg)   BMI 30.48 kg/m    Normal affect. Normal speech pattern and tone. Comfortable, no apaprent distress. No audible signs of SOB or wheezing  ASSESSMENT & PLAN:    1. HTN  -some changes made in her regimen during recent admissino - from reported bp's at rehab bps are fine, continue current regimen. We will clarify with rehab if she is on toprol or not, it is listed on her discharge summary.  2. LE edema -denies any recent issues, appears she has not had recurrent edema and is not on diuretic while at rehab - monitor at this time, if recurrent edema would need to restart lasix.    Request most recent labs.   COVID-19 Education: The signs and symptoms of COVID-19 were discussed with the  patient and how to seek care for testing (follow up with PCP or arrange E-visit).  The importance of social distancing was discussed today.  Time:   Today, I have spent 14 minutes with the patient with telehealth technology discussing the above problems.     Medication Adjustments/Labs and Tests Ordered: Current medicines are reviewed at length with the patient today.  Concerns regarding medicines are outlined above.   Tests Ordered: No orders of the defined types were placed in this encounter.   Medication Changes: No orders of the defined types were placed in this encounter.   Follow Up:  Virtual Visit  in 6 week(s)  Signed, Carlyle Dolly, MD  07/24/2019 8:15 AM    [place]

## 2019-07-25 ENCOUNTER — Other Ambulatory Visit: Payer: Self-pay | Admitting: *Deleted

## 2019-07-25 DIAGNOSIS — I10 Essential (primary) hypertension: Secondary | ICD-10-CM

## 2019-07-25 NOTE — Patient Outreach (Signed)
Member screened for potential [facility] Care Management needs as a benefit of [place] [employer].  Mrs. Toupin is currently receiving skilled therapy at [facility]  Telephone call made to Mrs. Ritter to discuss [facility] follow up. No answer. Appears to be a home answering machine. Telephone call made to son Elta Guadeloupe. No answer. Telephone call made to Kadee Jasa (son) at [phone]. Patient identifiers confirmed.   Donnie reports that he lives in [place], [place]. States he is going to pick Mrs. Japp up from [facility] on tomorrow 07/26/19 because of a COVID outbreak. Donnie reports he has been trying to convince his mother to move to [place], [place] with him. States she has been very resistant to this.   Mrs. Torrance's other son Elta Guadeloupe lives close to member in [place]. However, he has COVID. Donnie states the facility supplied him a list of paid caregivers. Donnie states member does have a life alert. Discussed that he may want to look into having cameras installed if Mrs. Hopf insists on remaining at home. Donnie liked the idea of cameras. Encouraged Donnie to please contact potential paid caregivers. Donnie states he will. Donnie states Mrs. Mogel has a person come to clean her home. States it took them 2 years to convince Mrs. Lacorte to consent to cleaning service.   Donnie endorses that he will be interested in [place] assistance for potential transportation and meal assistance. States Mrs. Jahnke has a history of not taking her medication properly. Donnie expresses appreciation of [facility] Pharmacist follow up. Donnie is also agreeable to [facility] RNCM follow up. Mrs. Schoener has a history of DM, HTN, CHF, gout, falls.   Facility [employer] planner indicates Mrs. Blanding will have [place].  Will make appropriate [facility] Care Management services since member is slated for discharge from SNF on 07/25/18. Mrs. Siems has high risk for readmission.    Silverio Decamp (son) states he plans on staying with Mrs. Hope for a few days unless he can convince member to go back to [place], [place] with him. Donnie to be primary contact initially at [phone].  Marthenia Rolling, MSN-Ed, RN,BSN [place] Acute Care Coordinator [phone] [facility]) [phone]  (Toll free office)

## 2019-07-27 ENCOUNTER — Other Ambulatory Visit: Payer: Self-pay

## 2019-07-27 ENCOUNTER — Other Ambulatory Visit: Payer: Self-pay | Admitting: Pharmacist

## 2019-07-27 DIAGNOSIS — E1122 Type 2 diabetes mellitus with diabetic chronic kidney disease: Secondary | ICD-10-CM | POA: Diagnosis not present

## 2019-07-27 DIAGNOSIS — Z299 Encounter for prophylactic measures, unspecified: Secondary | ICD-10-CM | POA: Diagnosis not present

## 2019-07-27 DIAGNOSIS — E1165 Type 2 diabetes mellitus with hyperglycemia: Secondary | ICD-10-CM | POA: Diagnosis not present

## 2019-07-27 DIAGNOSIS — I10 Essential (primary) hypertension: Secondary | ICD-10-CM | POA: Diagnosis not present

## 2019-07-27 DIAGNOSIS — M1712 Unilateral primary osteoarthritis, left knee: Secondary | ICD-10-CM | POA: Diagnosis not present

## 2019-07-27 DIAGNOSIS — E039 Hypothyroidism, unspecified: Secondary | ICD-10-CM | POA: Diagnosis not present

## 2019-07-27 DIAGNOSIS — Z789 Other specified health status: Secondary | ICD-10-CM | POA: Diagnosis not present

## 2019-07-27 DIAGNOSIS — Z6831 Body mass index (BMI) 31.0-31.9, adult: Secondary | ICD-10-CM | POA: Diagnosis not present

## 2019-07-27 NOTE — Patient Outreach (Signed)
[facility]) Care Management  [place]   07/27/2019  Rhonda Mejia [birth year] [id number]  Reason for referral: Medication Review after discharge home from SNF  Referral source: [place] Acute Care Coordinator Current insurance: [employer]  PMHx includes but not limited to:  HTN, HLD, HFpEF (EF 50-55% 12/20), carotid stenosis, back pain, T2DM diet controlled, GERD, gout, hypothyroidism, neuropathy, pill dysphagia, noted recent hospitalization with fluid overload and anemia  Outreach:  Unsuccessful telephone call attempt #1 to patient. HIPAA compliant voicemail left requesting a return call  Plan:  -I will mail patient an unsuccessful outreach letter.  -I will make another outreach attempt to patient within 3-4 business days.    Ralene Bathe, PharmD, [place] [phone]

## 2019-07-27 NOTE — Patient Outreach (Signed)
[facility]) Care Management  07/27/2019  Lorisa MAKENSI FAILING 1928/10/14 [id number]   High priority social work referral received from [place], Marthenia Rolling, to contact patient's son regarding meals and transportation.   Successful outreach to patient.s son, Letitia Libra, today however he requested call back tomorrow morning.  [name], BSW Social Worker [phone]

## 2019-07-28 ENCOUNTER — Ambulatory Visit: Payer: Self-pay

## 2019-07-28 ENCOUNTER — Encounter: Payer: Self-pay | Admitting: *Deleted

## 2019-07-28 ENCOUNTER — Other Ambulatory Visit: Payer: Self-pay

## 2019-07-28 ENCOUNTER — Other Ambulatory Visit: Payer: Self-pay | Admitting: *Deleted

## 2019-07-28 NOTE — Patient Outreach (Signed)
[facility]) Care Management  07/28/2019  Rhonda Mejia June 01, 1929 [id number]   High priority social work referral received from [place], Marthenia Rolling, on 17/21 to contact patient's son regarding meals and transportation.  Successful outreach to patient.s son, Rhonda Mejia, yesterday but he requested call back today.  Unsuccessful outreach today; left voicemail message.  Mailed unsuccessful outreach letter.  Will attempt to reach again within four business days if no return call.  [name], BSW Social Worker [phone]

## 2019-07-28 NOTE — Patient Outreach (Signed)
Referral received from post acute care coordinator, pt discharged Rhonda Mejia on 07/25/18 after rehab for multiple falls.  Pt with diagnoses CHF, DM, osteoarthritis bil knees, HTN, hypothyroidism, confusion, DNR.  Spoke with patient's son Rhonda Mejia/ primary contact and HCPOA, per Mr. Kult, he lives in [place] and pt refused to come live with him, pt has another son Rhonda Mejia who is out on medical leave and is assisting pt but can only do so much, pt refuses to pay for any assistance in the home although she could pay out of pocket, pt does not want anyone coming into her home due to Covid and also refused home health services.  Pt does not want to go into a facility, son is not sure what to do and states pt is stubborn and is only going to do what she wants to do, reports pt does have confusion and not sure about her decision making/ cognitive abilities and would be open to obtaining guardianship if appropriate if [facility] social worker could help guide him with this process to see if this is an option.  Pt has CHF but does not weigh and is not going to ever weigh per son, also has DM and checks CBG maybe once monthly and is not going to do this either, last CBG upon discharge from facility is 119. Pt gets medications prefilled in bubble packs from [place] and per son " she doesn't take these right either"  Son knows pt should not be home alone due to falls and other issues mentioned but he has not been able to convince pt to do anything differently.  Pt has had approximately 14 falls in 18 months, some of these with injury.  Pt has walker, cane and other DME but does not always use, also has neuropathy, pt refused home health PT.  RN CM faxed today's note and barrier letter to primary MD.  Outpatient Encounter Medications as of 07/28/2019  Medication Sig  . allopurinol (ZYLOPRIM) 300 MG tablet Take 300 mg by mouth daily.  . AMITIZA 24 MCG capsule Take 24 mcg by mouth daily as needed.   Marland Kitchen aspirin  EC 81 MG tablet Take 81 mg by mouth daily.  . Cyanocobalamin (B-12) 2500 MCG TABS Take 1 tablet by mouth daily.   . diclofenac sodium (VOLTAREN) 1 % GEL APPLY 2-4 GRAMS TO THE AFFECTED AREA TWICE DAILY  . folic acid (FOLVITE) 1 MG tablet Take 1 mg by mouth daily.  Marland Kitchen gabapentin (NEURONTIN) 300 MG capsule Take 300 mg by mouth 4 (four) times daily.   . hydrALAZINE (APRESOLINE) 50 MG tablet Take 50 mg by mouth 3 (three) times daily.  . hydrocortisone 2.5 % ointment APPLY A SMALL AMOUNT TO ITCHY PATCHES ON FACE TWICE DAILY  . ibuprofen (ADVIL,MOTRIN) 800 MG tablet TAKE ONE TABLET BY MOUTH EVERY 8 HOURS AS NEEDED WITH FOOD  . levothyroxine (SYNTHROID) 112 MCG tablet Take 112 mcg by mouth daily.  . metoprolol succinate (TOPROL-XL) 50 MG 24 hr tablet Take 50 mg by mouth daily. Take with or immediately following a meal.  . omeprazole (PRILOSEC) 40 MG capsule Take 40 mg by mouth daily.  Marland Kitchen pyridOXINE (VITAMIN B-6) 100 MG tablet Take 100 mg by mouth daily.  . traMADol (ULTRAM) 50 MG tablet Take 100 mg by mouth 2 (two) times daily.   Marland Kitchen triamcinolone cream (KENALOG) 0.1 % APPLY TO ITCHY SKIN DAILY (DO NOT USE ON FACE)  . venlafaxine XR (EFFEXOR-XR) 75 MG 24 hr capsule  Take 1 capsule by mouth daily.   No facility-administered encounter medications on file as of 07/28/2019.    THN CM Care Plan Problem One     Most Recent Value  Care Plan Problem One  Pt high risk for falls  Role Documenting the Problem One  Care Management [place] for Problem One  Active  THN Long Term Goal   Pt will have no falls within 60 days  THN Long Term Goal Start Date  07/28/19  Interventions for Problem One Long Term Goal  RN CM established plan of care with patient's son Rhonda Mejia, reviewed medications, reviewed HF action plan, mailed successful outreach letter to pt home including 24 hour nurse line magnet, pamphlet and consent form.  THN CM Short Term Goal #1   Patient's son will work with pt on utilizing safety  precautions within 30 days  THN CM Short Term Goal #1 Start Date  07/28/19  Interventions for Short Term Goal #1  RNCM reviewed safety precautions with patient's son and importance that pt use DME and have assistance in the home  [facility] CM Short Term Goal #2   Patient's son will work with Rhonda Mejia social worker within 30 days on options such as appropriateness of guardianship  THN CM Short Term Goal #2 Start Date  07/28/19  Interventions for Short Term Goal #2  RN CM sent in basket note to [facility] Mejia social worker regarding pt being home alone and refusing any assistance, son feels guardianship may be appropriate      PLAN Continue weekly transition of care Collaborate with [facility] social worker as needed  Jacqlyn Larsen [employer] Mejia, [place] Coordinator [phone]

## 2019-07-31 ENCOUNTER — Other Ambulatory Visit: Payer: Self-pay

## 2019-07-31 DIAGNOSIS — M549 Dorsalgia, unspecified: Secondary | ICD-10-CM | POA: Diagnosis not present

## 2019-07-31 DIAGNOSIS — Z7982 Long term (current) use of aspirin: Secondary | ICD-10-CM | POA: Diagnosis not present

## 2019-07-31 DIAGNOSIS — K219 Gastro-esophageal reflux disease without esophagitis: Secondary | ICD-10-CM | POA: Diagnosis not present

## 2019-07-31 DIAGNOSIS — I130 Hypertensive heart and chronic kidney disease with heart failure and stage 1 through stage 4 chronic kidney disease, or unspecified chronic kidney disease: Secondary | ICD-10-CM | POA: Diagnosis not present

## 2019-07-31 DIAGNOSIS — N179 Acute kidney failure, unspecified: Secondary | ICD-10-CM | POA: Diagnosis not present

## 2019-07-31 DIAGNOSIS — N189 Chronic kidney disease, unspecified: Secondary | ICD-10-CM | POA: Diagnosis not present

## 2019-07-31 DIAGNOSIS — I5033 Acute on chronic diastolic (congestive) heart failure: Secondary | ICD-10-CM | POA: Diagnosis not present

## 2019-07-31 DIAGNOSIS — E559 Vitamin D deficiency, unspecified: Secondary | ICD-10-CM | POA: Diagnosis not present

## 2019-07-31 DIAGNOSIS — E039 Hypothyroidism, unspecified: Secondary | ICD-10-CM | POA: Diagnosis not present

## 2019-07-31 DIAGNOSIS — M1990 Unspecified osteoarthritis, unspecified site: Secondary | ICD-10-CM | POA: Diagnosis not present

## 2019-07-31 DIAGNOSIS — E1122 Type 2 diabetes mellitus with diabetic chronic kidney disease: Secondary | ICD-10-CM | POA: Diagnosis not present

## 2019-07-31 DIAGNOSIS — D631 Anemia in chronic kidney disease: Secondary | ICD-10-CM | POA: Diagnosis not present

## 2019-07-31 DIAGNOSIS — M109 Gout, unspecified: Secondary | ICD-10-CM | POA: Diagnosis not present

## 2019-07-31 DIAGNOSIS — Z9181 History of falling: Secondary | ICD-10-CM | POA: Diagnosis not present

## 2019-07-31 NOTE — Patient Outreach (Signed)
[facility]) Care Management  07/31/2019  Rhonda Mejia [birth year] [id number]   High priority social work referral received from [place], Marthenia Rolling, on 17/21 to contact patient's son regarding meals and transportation. Successful outreach to patient's son that day but he requested call back on 07/28/19.  Unsuccessful outreach on 1/8; left voicemail message and   mailed unsuccessful outreach letter.   In-basket message received from [facility], Rhonda Mejia, at end of the day on 1/8 that son inquired about process for seeking guardianship. Attempted to contact son again today to discuss original reason for referral as well as guardianship process.  Had to leave another message.  Will attempt to reach again within four business days.        [name], BSW Social Worker [phone]

## 2019-08-01 ENCOUNTER — Other Ambulatory Visit: Payer: Self-pay

## 2019-08-01 NOTE — Patient Outreach (Signed)
[facility]) Care Management  08/01/2019  Antonieta CALIA ARTIM 1929/04/02 [id number]   Successful outreach to patient's son, Letitia Libra, today.  Explained reason for social work referral; meals and transportation.   Informed son about [employer] program which will allow patient to temporarily receive prepared meals over the next 30 days.  Son feels that patient would benefit from this service but was not certain that she would be open to it.  Encouraged me to contact her to discuss. Patient's son, Letitia Libra, has returned home to [place] but stated that her other son, Elta Guadeloupe, is assisting with transportation.  Elta Guadeloupe will be returning to work toward the end or January at which time nieces and nephews can assist.  Letitia Libra stated that it would be helpful for patient to have other transportation option, however patient does not have ramp at home and needs hands on assistance to get down stairs outside of home.  Informed Donnie that transportation services typically provide very minimal hands on assistance, if any, for liability reasons.  Donnie stated that he has offered multiple times to build a ramp at patient's home but she has not agreed to this.   Donnie stated that he may have to just install a ramp at some point even if patient does not want it as he feels this would be safer for her all around. Talked with [facility] about process for obtaining guardianship as he inquired about this during call with RNCM, Jacqlyn Larsen. Educated Donnie about steps to be taken if he/family decide to pursue this.   Successful outreach to patient after completing call with son.  Informed her about Mom's Meals program and she consented to referral.   Social work case is being closed at this time but did encourage patient and/or son to call if additional needs arise.  [name], BSW Social Worker [phone]

## 2019-08-02 ENCOUNTER — Ambulatory Visit: Payer: Medicare Other | Admitting: Orthopaedic Surgery

## 2019-08-02 DIAGNOSIS — N179 Acute kidney failure, unspecified: Secondary | ICD-10-CM | POA: Diagnosis not present

## 2019-08-02 DIAGNOSIS — D631 Anemia in chronic kidney disease: Secondary | ICD-10-CM | POA: Diagnosis not present

## 2019-08-02 DIAGNOSIS — N189 Chronic kidney disease, unspecified: Secondary | ICD-10-CM | POA: Diagnosis not present

## 2019-08-02 DIAGNOSIS — E1122 Type 2 diabetes mellitus with diabetic chronic kidney disease: Secondary | ICD-10-CM | POA: Diagnosis not present

## 2019-08-02 DIAGNOSIS — I5033 Acute on chronic diastolic (congestive) heart failure: Secondary | ICD-10-CM | POA: Diagnosis not present

## 2019-08-02 DIAGNOSIS — I130 Hypertensive heart and chronic kidney disease with heart failure and stage 1 through stage 4 chronic kidney disease, or unspecified chronic kidney disease: Secondary | ICD-10-CM | POA: Diagnosis not present

## 2019-08-04 ENCOUNTER — Other Ambulatory Visit: Payer: Self-pay | Admitting: *Deleted

## 2019-08-04 NOTE — Patient Outreach (Signed)
Outreach call to patient's son (contact person) for transition of care week 2, no answer to son Donnie Mellen's cell phone, left voicemail requesting return phone call.  RN CM mailed unsuccessful outreach letter to pt home.  PLAN Outreach patient's son Letitia Libra in 3-4 business days  Jacqlyn Larsen [facility], [place] Coordinator [phone]

## 2019-08-07 ENCOUNTER — Other Ambulatory Visit: Payer: Self-pay | Admitting: *Deleted

## 2019-08-07 DIAGNOSIS — N189 Chronic kidney disease, unspecified: Secondary | ICD-10-CM | POA: Diagnosis not present

## 2019-08-07 DIAGNOSIS — I130 Hypertensive heart and chronic kidney disease with heart failure and stage 1 through stage 4 chronic kidney disease, or unspecified chronic kidney disease: Secondary | ICD-10-CM | POA: Diagnosis not present

## 2019-08-07 DIAGNOSIS — N179 Acute kidney failure, unspecified: Secondary | ICD-10-CM | POA: Diagnosis not present

## 2019-08-07 DIAGNOSIS — I5033 Acute on chronic diastolic (congestive) heart failure: Secondary | ICD-10-CM | POA: Diagnosis not present

## 2019-08-07 DIAGNOSIS — D631 Anemia in chronic kidney disease: Secondary | ICD-10-CM | POA: Diagnosis not present

## 2019-08-07 DIAGNOSIS — E1122 Type 2 diabetes mellitus with diabetic chronic kidney disease: Secondary | ICD-10-CM | POA: Diagnosis not present

## 2019-08-07 NOTE — Patient Outreach (Addendum)
Message received from [place] worker requesting RN CM contact pt to discuss sodium content of meals she is receiving from Mom's meals. RN CM called pt and no answer to telephone, left voicemail requesting return phone call, mailed unsuccessful outreach letter to pt home. Late entry for 08/07/19- Pt called back and RN CM discussed sodium content of meals as being no more than 600mg  of sodium for the entire meal according to what pt is reading on the label and that this should be within guidelines for intake of sodium.  RN CM reviewed how to count the sodium per serving size, pt verbalizes understanding but sounds somewhat confused and rambles, changes subject frequently. RN CM ask pt to show the meals to any caregivers/ home health staff coming into her home if she has further questions.  RN CM has been unable to get in touch with her son for discussion.   PLAN Outreach pt/son within 3-4 business days  Jacqlyn Larsen [facility], [place] Coordinator [phone]

## 2019-08-09 ENCOUNTER — Other Ambulatory Visit: Payer: Self-pay | Admitting: *Deleted

## 2019-08-09 NOTE — Patient Outreach (Addendum)
Outreach call to patient's son Rhonda Mejia for transition of care week 2/  2nd attempt, Rhonda Mejia prefers to be contacted due to pt has confusion.  No answer to telephone and immediately goes to voicemail.  RN CM left message requesting return phone call.  PLAN Outreach patient's son in 3-4 business days  Rhonda Mejia [facility], [place] Coordinator [phone]

## 2019-08-10 DIAGNOSIS — D631 Anemia in chronic kidney disease: Secondary | ICD-10-CM | POA: Diagnosis not present

## 2019-08-10 DIAGNOSIS — I5033 Acute on chronic diastolic (congestive) heart failure: Secondary | ICD-10-CM | POA: Diagnosis not present

## 2019-08-10 DIAGNOSIS — E1122 Type 2 diabetes mellitus with diabetic chronic kidney disease: Secondary | ICD-10-CM | POA: Diagnosis not present

## 2019-08-10 DIAGNOSIS — N189 Chronic kidney disease, unspecified: Secondary | ICD-10-CM | POA: Diagnosis not present

## 2019-08-10 DIAGNOSIS — N179 Acute kidney failure, unspecified: Secondary | ICD-10-CM | POA: Diagnosis not present

## 2019-08-10 DIAGNOSIS — I130 Hypertensive heart and chronic kidney disease with heart failure and stage 1 through stage 4 chronic kidney disease, or unspecified chronic kidney disease: Secondary | ICD-10-CM | POA: Diagnosis not present

## 2019-08-11 DIAGNOSIS — I10 Essential (primary) hypertension: Secondary | ICD-10-CM | POA: Diagnosis not present

## 2019-08-11 DIAGNOSIS — E1165 Type 2 diabetes mellitus with hyperglycemia: Secondary | ICD-10-CM | POA: Diagnosis not present

## 2019-08-11 DIAGNOSIS — Z6830 Body mass index (BMI) 30.0-30.9, adult: Secondary | ICD-10-CM | POA: Diagnosis not present

## 2019-08-11 DIAGNOSIS — E1122 Type 2 diabetes mellitus with diabetic chronic kidney disease: Secondary | ICD-10-CM | POA: Diagnosis not present

## 2019-08-11 DIAGNOSIS — Z299 Encounter for prophylactic measures, unspecified: Secondary | ICD-10-CM | POA: Diagnosis not present

## 2019-08-11 DIAGNOSIS — Z789 Other specified health status: Secondary | ICD-10-CM | POA: Diagnosis not present

## 2019-08-11 DIAGNOSIS — R609 Edema, unspecified: Secondary | ICD-10-CM | POA: Diagnosis not present

## 2019-08-14 DIAGNOSIS — N189 Chronic kidney disease, unspecified: Secondary | ICD-10-CM | POA: Diagnosis not present

## 2019-08-14 DIAGNOSIS — I130 Hypertensive heart and chronic kidney disease with heart failure and stage 1 through stage 4 chronic kidney disease, or unspecified chronic kidney disease: Secondary | ICD-10-CM | POA: Diagnosis not present

## 2019-08-14 DIAGNOSIS — D631 Anemia in chronic kidney disease: Secondary | ICD-10-CM | POA: Diagnosis not present

## 2019-08-14 DIAGNOSIS — N179 Acute kidney failure, unspecified: Secondary | ICD-10-CM | POA: Diagnosis not present

## 2019-08-14 DIAGNOSIS — I5033 Acute on chronic diastolic (congestive) heart failure: Secondary | ICD-10-CM | POA: Diagnosis not present

## 2019-08-14 DIAGNOSIS — E1122 Type 2 diabetes mellitus with diabetic chronic kidney disease: Secondary | ICD-10-CM | POA: Diagnosis not present

## 2019-08-15 ENCOUNTER — Other Ambulatory Visit: Payer: Self-pay | Admitting: *Deleted

## 2019-08-15 NOTE — Patient Outreach (Signed)
Outreach call to patient's son Rhonda Mejia for transition of care week 2/ 3rd attempt, spoke with Rhonda Mejia who reports he got a new phone and had difficulty getting calls.  Rhonda Mejia states he saw his mother this past weekend and his brother Rhonda Mejia is still assisting pt, reports pt is " not [place] weigh and not checking blood sugar"  Rhonda Mejia states they cannot make their mother do anything, also pt "is having fits over her food, wants salty, fried food" and pt continues on about this issue until she gets the food she wants.  Rhonda Mejia states that at 86 years old, he is tired of fighting about it and they have given in some to pt.  RN CM encouraged Rhonda Mejia to continue trying and make suggestions to pt, continue providing oversight.  Pt has had no recent falls and is working with home health PT.  Plan is for pt to continue staying in her home at present with assistance of her son and other family.  [facility] CM Care Plan Problem One     Most Recent Value  Care Plan Problem One  Pt high risk for falls  Role Documenting the Problem One  Care Management [place] for Problem One  Active  [facility] Long Term Goal   Pt will have no falls within 60 days  [facility] Long Term Goal Start Date  07/28/19  Interventions for Problem One Long Term Goal  RN CM reviewed plan of care with son, options for level of care and no changes being made at present  [facility] CM Short Term Goal #1   Patient's son will work with pt on utilizing safety precautions within 30 days  [facility] CM Short Term Goal #1 Start Date  07/28/19  Interventions for Short Term Goal #1  RN CM reinforced safety precautions, pt continues working with home health PT  [facility] CM Short Term Goal #2   Patient's son will work with [facility] social worker within 30 days on options such as appropriateness of guardianship  [facility] CM Short Term Goal #2 Start Date  07/28/19  [facility] CM Short Term Goal #2 Met Date  08/15/19  Interventions for Short Term Goal #2  Patient's son Rhonda Mejia worked with [facility] social  worker for resources, guidance related to keeping pt at home and safe.      PLAN Continue weekly transition of care  Rhonda Mejia [facility], [place] Coordinator [phone]

## 2019-08-16 DIAGNOSIS — I5033 Acute on chronic diastolic (congestive) heart failure: Secondary | ICD-10-CM | POA: Diagnosis not present

## 2019-08-16 DIAGNOSIS — D631 Anemia in chronic kidney disease: Secondary | ICD-10-CM | POA: Diagnosis not present

## 2019-08-16 DIAGNOSIS — N189 Chronic kidney disease, unspecified: Secondary | ICD-10-CM | POA: Diagnosis not present

## 2019-08-16 DIAGNOSIS — I130 Hypertensive heart and chronic kidney disease with heart failure and stage 1 through stage 4 chronic kidney disease, or unspecified chronic kidney disease: Secondary | ICD-10-CM | POA: Diagnosis not present

## 2019-08-16 DIAGNOSIS — E1122 Type 2 diabetes mellitus with diabetic chronic kidney disease: Secondary | ICD-10-CM | POA: Diagnosis not present

## 2019-08-16 DIAGNOSIS — N179 Acute kidney failure, unspecified: Secondary | ICD-10-CM | POA: Diagnosis not present

## 2019-08-21 DIAGNOSIS — D631 Anemia in chronic kidney disease: Secondary | ICD-10-CM | POA: Diagnosis not present

## 2019-08-21 DIAGNOSIS — I5033 Acute on chronic diastolic (congestive) heart failure: Secondary | ICD-10-CM | POA: Diagnosis not present

## 2019-08-21 DIAGNOSIS — I130 Hypertensive heart and chronic kidney disease with heart failure and stage 1 through stage 4 chronic kidney disease, or unspecified chronic kidney disease: Secondary | ICD-10-CM | POA: Diagnosis not present

## 2019-08-21 DIAGNOSIS — N179 Acute kidney failure, unspecified: Secondary | ICD-10-CM | POA: Diagnosis not present

## 2019-08-21 DIAGNOSIS — N189 Chronic kidney disease, unspecified: Secondary | ICD-10-CM | POA: Diagnosis not present

## 2019-08-21 DIAGNOSIS — E1122 Type 2 diabetes mellitus with diabetic chronic kidney disease: Secondary | ICD-10-CM | POA: Diagnosis not present

## 2019-08-22 ENCOUNTER — Other Ambulatory Visit: Payer: Self-pay | Admitting: *Deleted

## 2019-08-22 DIAGNOSIS — I10 Essential (primary) hypertension: Secondary | ICD-10-CM | POA: Diagnosis not present

## 2019-08-22 DIAGNOSIS — Z299 Encounter for prophylactic measures, unspecified: Secondary | ICD-10-CM | POA: Diagnosis not present

## 2019-08-22 DIAGNOSIS — E1165 Type 2 diabetes mellitus with hyperglycemia: Secondary | ICD-10-CM | POA: Diagnosis not present

## 2019-08-22 DIAGNOSIS — Z6831 Body mass index (BMI) 31.0-31.9, adult: Secondary | ICD-10-CM | POA: Diagnosis not present

## 2019-08-22 DIAGNOSIS — E1142 Type 2 diabetes mellitus with diabetic polyneuropathy: Secondary | ICD-10-CM | POA: Diagnosis not present

## 2019-08-22 DIAGNOSIS — M1711 Unilateral primary osteoarthritis, right knee: Secondary | ICD-10-CM | POA: Diagnosis not present

## 2019-08-22 NOTE — Patient Outreach (Signed)
Outreach call to patient's son Letitia Libra for transition of care week 3, no answer to telephone, left voicemail requesting return phone call. Letter previously mailed to pt home, son feels no need to continue mailing unsuccessful letters to pt home as he is the primary contact.  PLAN Outreach patient's son in 3-4 business days  Jacqlyn Larsen [facility], [place] Coordinator [phone]

## 2019-08-23 DIAGNOSIS — I5033 Acute on chronic diastolic (congestive) heart failure: Secondary | ICD-10-CM | POA: Diagnosis not present

## 2019-08-23 DIAGNOSIS — N189 Chronic kidney disease, unspecified: Secondary | ICD-10-CM | POA: Diagnosis not present

## 2019-08-23 DIAGNOSIS — D631 Anemia in chronic kidney disease: Secondary | ICD-10-CM | POA: Diagnosis not present

## 2019-08-23 DIAGNOSIS — I130 Hypertensive heart and chronic kidney disease with heart failure and stage 1 through stage 4 chronic kidney disease, or unspecified chronic kidney disease: Secondary | ICD-10-CM | POA: Diagnosis not present

## 2019-08-23 DIAGNOSIS — E1122 Type 2 diabetes mellitus with diabetic chronic kidney disease: Secondary | ICD-10-CM | POA: Diagnosis not present

## 2019-08-23 DIAGNOSIS — N179 Acute kidney failure, unspecified: Secondary | ICD-10-CM | POA: Diagnosis not present

## 2019-08-25 ENCOUNTER — Other Ambulatory Visit: Payer: Self-pay | Admitting: *Deleted

## 2019-08-25 NOTE — Patient Outreach (Signed)
Outreach call to patient's son Donnie/ 2nd attempt/ for transition of care week 3, no answer to telephone, left voicemail requesting return phone call.  PLAN Outreach pt in 3-4 business days  Jacqlyn Larsen [facility], [place] [phone]

## 2019-08-28 DIAGNOSIS — I5033 Acute on chronic diastolic (congestive) heart failure: Secondary | ICD-10-CM | POA: Diagnosis not present

## 2019-08-28 DIAGNOSIS — D631 Anemia in chronic kidney disease: Secondary | ICD-10-CM | POA: Diagnosis not present

## 2019-08-28 DIAGNOSIS — N189 Chronic kidney disease, unspecified: Secondary | ICD-10-CM | POA: Diagnosis not present

## 2019-08-28 DIAGNOSIS — I130 Hypertensive heart and chronic kidney disease with heart failure and stage 1 through stage 4 chronic kidney disease, or unspecified chronic kidney disease: Secondary | ICD-10-CM | POA: Diagnosis not present

## 2019-08-28 DIAGNOSIS — E1122 Type 2 diabetes mellitus with diabetic chronic kidney disease: Secondary | ICD-10-CM | POA: Diagnosis not present

## 2019-08-28 DIAGNOSIS — N179 Acute kidney failure, unspecified: Secondary | ICD-10-CM | POA: Diagnosis not present

## 2019-08-30 ENCOUNTER — Other Ambulatory Visit: Payer: Self-pay | Admitting: *Deleted

## 2019-08-30 DIAGNOSIS — E1122 Type 2 diabetes mellitus with diabetic chronic kidney disease: Secondary | ICD-10-CM | POA: Diagnosis not present

## 2019-08-30 DIAGNOSIS — N179 Acute kidney failure, unspecified: Secondary | ICD-10-CM | POA: Diagnosis not present

## 2019-08-30 DIAGNOSIS — Z7982 Long term (current) use of aspirin: Secondary | ICD-10-CM | POA: Diagnosis not present

## 2019-08-30 DIAGNOSIS — E559 Vitamin D deficiency, unspecified: Secondary | ICD-10-CM | POA: Diagnosis not present

## 2019-08-30 DIAGNOSIS — E039 Hypothyroidism, unspecified: Secondary | ICD-10-CM | POA: Diagnosis not present

## 2019-08-30 DIAGNOSIS — N189 Chronic kidney disease, unspecified: Secondary | ICD-10-CM | POA: Diagnosis not present

## 2019-08-30 DIAGNOSIS — M109 Gout, unspecified: Secondary | ICD-10-CM | POA: Diagnosis not present

## 2019-08-30 DIAGNOSIS — M549 Dorsalgia, unspecified: Secondary | ICD-10-CM | POA: Diagnosis not present

## 2019-08-30 DIAGNOSIS — K219 Gastro-esophageal reflux disease without esophagitis: Secondary | ICD-10-CM | POA: Diagnosis not present

## 2019-08-30 DIAGNOSIS — Z9181 History of falling: Secondary | ICD-10-CM | POA: Diagnosis not present

## 2019-08-30 DIAGNOSIS — I130 Hypertensive heart and chronic kidney disease with heart failure and stage 1 through stage 4 chronic kidney disease, or unspecified chronic kidney disease: Secondary | ICD-10-CM | POA: Diagnosis not present

## 2019-08-30 DIAGNOSIS — M1990 Unspecified osteoarthritis, unspecified site: Secondary | ICD-10-CM | POA: Diagnosis not present

## 2019-08-30 DIAGNOSIS — D631 Anemia in chronic kidney disease: Secondary | ICD-10-CM | POA: Diagnosis not present

## 2019-08-30 DIAGNOSIS — I5033 Acute on chronic diastolic (congestive) heart failure: Secondary | ICD-10-CM | POA: Diagnosis not present

## 2019-08-30 NOTE — Patient Outreach (Signed)
Outreach call to patient's son Marthena Haner contact person with no answer to telephone and no option to leave voicemail.    PLAN Close case by end of week if son does not contact RN CM  Jacqlyn Larsen [facility], [place] Coordinator [phone]

## 2019-09-01 ENCOUNTER — Other Ambulatory Visit: Payer: Self-pay | Admitting: *Deleted

## 2019-09-01 NOTE — Patient Outreach (Signed)
Case closure due to unable to maintain contact with patient's son,  RN CM sent in basket to [facility] pharmacist informing of case closure as pharmacy is still active.  RN CM mailed case closure letter to patient's home and faxed case closure letter to primary MD.  Case closure for RN CM   Jacqlyn Larsen [facility], [place] Coordinator [phone]

## 2019-09-04 ENCOUNTER — Encounter: Payer: Self-pay | Admitting: Cardiology

## 2019-09-04 ENCOUNTER — Telehealth (INDEPENDENT_AMBULATORY_CARE_PROVIDER_SITE_OTHER): Payer: Medicare Other | Admitting: Cardiology

## 2019-09-04 VITALS — Ht 66.0 in | Wt 172.0 lb

## 2019-09-04 DIAGNOSIS — N179 Acute kidney failure, unspecified: Secondary | ICD-10-CM | POA: Diagnosis not present

## 2019-09-04 DIAGNOSIS — E1122 Type 2 diabetes mellitus with diabetic chronic kidney disease: Secondary | ICD-10-CM | POA: Diagnosis not present

## 2019-09-04 DIAGNOSIS — I10 Essential (primary) hypertension: Secondary | ICD-10-CM | POA: Diagnosis not present

## 2019-09-04 DIAGNOSIS — I130 Hypertensive heart and chronic kidney disease with heart failure and stage 1 through stage 4 chronic kidney disease, or unspecified chronic kidney disease: Secondary | ICD-10-CM | POA: Diagnosis not present

## 2019-09-04 DIAGNOSIS — R600 Localized edema: Secondary | ICD-10-CM

## 2019-09-04 DIAGNOSIS — N189 Chronic kidney disease, unspecified: Secondary | ICD-10-CM | POA: Diagnosis not present

## 2019-09-04 DIAGNOSIS — I5033 Acute on chronic diastolic (congestive) heart failure: Secondary | ICD-10-CM | POA: Diagnosis not present

## 2019-09-04 DIAGNOSIS — D631 Anemia in chronic kidney disease: Secondary | ICD-10-CM | POA: Diagnosis not present

## 2019-09-04 NOTE — Patient Instructions (Signed)

## 2019-09-04 NOTE — Progress Notes (Signed)
Virtual Visit via Telephone Note   This visit type was conducted due to national recommendations for restrictions regarding the COVID-19 Pandemic (e.g. social distancing) in an effort to limit this patient's exposure and mitigate transmission in our community.  Due to her co-morbid illnesses, this patient is at least at moderate risk for complications without adequate follow up.  This format is felt to be most appropriate for this patient at this time.  The patient did not have access to video technology/had technical difficulties with video requiring transitioning to audio format only (telephone).  All issues noted in this document were discussed and addressed.  No physical exam could be performed with this format.  Please refer to the patient's chart for her  consent to telehealth for [name].   Date:  09/04/2019   ID:  Rhonda Mejia, DOB 11-15-1928, MRN [id number]  Patient Location: Home Provider Location: Office  PCP:  Monico Blitz, MD  Cardiologist:  Carlyle Dolly, MD  Electrophysiologist:  None   Evaluation Performed:  Follow-Up Visit  Chief Complaint:  Follow up  History of Present Illness:    Rhonda Mejia is a 86 y.o. female seen today for follow up of the following medical problema.    1. HTN - side effects on higher doses of norvasc, with LE edema and some fatigue.  - during a prior visit due to high SBPs in the 150s-160s we started labetaolol - on labetalol was having some low bp's, fatigue. We lowered the dose to 100mg  bid, early on since change bp's are improving and fatigue resolving. .  - most recently bp 129/52 and HR 56 with lower labeatlol - since admission off norvasc 2.5, hydralazine was lowered to 50mg  tid. Off labetalol, appears she may be on toprol instead   - home bp's 130s/ 60s   2. LE edema - echo 02/2015 difficult study ,with LVEF 50-55%, normal RV function. Diastolic function not described.  - norvasc stopped due to LE  edema. Lasix increased to 40mg  daily. Edema has improved  - repeat echo 09/2015 LVEF 60-65%, abnormal diastolic function grade indeterminate  - followed by vascular for chronic venous insufficiency    - recent admission [facility] with fluid overload and anemia Admission Labs: pro BNP 11209 Cr 1.78 Hgb 6.7 Trop 0.06 ABG 7.46/38/61 CT head: no acute process.  CXR cardiomegaly, pulm congestion RLE Venous US: no DVT EKG SR, LBBB 06/2019 echo: LVEF 50-55%, grade II diastolic dysfunction, normal RV, mod BAE, mild AS, mild to mod MR,  12/22 patient had more recent labs.famiyl reportzs Hgb was 10.5 and Cr 1.05 by those labs.   -  mild swelling, wears compression stockings. Taking lasix 20mg  daily, will take additional 40mg    3. Carotid stenosis - carotid US 09/2015 with concern for distal left CCA stenosis, 1-39% right ICA stenosis.  - 11/2015 CTA neck with [id number] LICA stenosis, focal contained dissecion left ICA, no significant RICA disease - followed by vascular. From there consultation CTA results thought to be misleading due to heavy calficications and fairly mild velocities by carotid US.   The patient does not have symptoms concerning for COVID-19 infection (fever, chills, cough, or new shortness of breath).    Past Medical History:  Diagnosis Date  . Cancer [facility])    left breast cancer  . Carotid artery occlusion   . Diabetes mellitus without complication (HCC)    diet controlled  . Family hx of colon cancer   . GERD (gastroesophageal reflux disease)   .  High cholesterol   . History of gout   . Hypertension    for over 20 yrs  . Hypothyroid   . Neuropathy   . Pill dysphagia    Past Surgical History:  Procedure Laterality Date  . BACK SURGERY     5 rods and 5 screws and cage in back, has had 4 back surgeries.  Marland Kitchen BREAST LUMPECTOMY     left breast  . CATARACT EXTRACTION W/PHACO Left 12/31/2014   Procedure: CATARACT EXTRACTION PHACO AND INTRAOCULAR LENS PLACEMENT (IOC);   Surgeon: Tonny Lemon Whitacre, MD;  Location: [facility] ORS;  Service: Ophthalmology;  Laterality: Left;  CDE:8.45  . CATARACT EXTRACTION W/PHACO Right 01/28/2015   Procedure: CATARACT EXTRACTION PHACO AND INTRAOCULAR LENS PLACEMENT RIGHT EYE;  Surgeon: Tonny Azalea Cedar, MD;  Location: [facility] ORS;  Service: Ophthalmology;  Laterality: Right;  CDE:4.92  . CHOLECYSTECTOMY    . COLONOSCOPY N/A 10/21/2012   Procedure: COLONOSCOPY;  Surgeon: Rogene Houston, MD;  Location: [facility] ENDO SUITE;  Service: Endoscopy;  Laterality: N/A;  . ESOPHAGOGASTRODUODENOSCOPY (EGD) WITH ESOPHAGEAL DILATION N/A 06/09/2013   Procedure: ESOPHAGOGASTRODUODENOSCOPY (EGD) WITH ESOPHAGEAL DILATION;  Surgeon: Rogene Houston, MD;  Location: [facility] END[facility] SU[facility];  Service: Endoscopy;  Laterality: N/A;  125  . KNEE SURGERY Right    arthroscopy x2  . SPINE SURGERY     3 surgeries by Dr. Jenne Campus,  Last surgery 2 years ago by Dr. Carloyn Manner in [place]     No outpatient medications have been marked as taking for the 09/04/19 encounter (Appointment) with Arnoldo Lenis, MD.     Allergies:   Crestor [rosuvastatin], Hydrocodone, Lescol [fluvastatin], Lipitor [atorvastatin], Mobic [meloxicam], Norvasc [amlodipine], Penicillins, Spironolactone, Sulfa antibiotics, Tramadol, Zithromax [azithromycin], and Zocor [simvastatin]   Social History   Tobacco Use  . Smoking status: Never Smoker  . Smokeless tobacco: Never Used  Substance Use Topics  . Alcohol use: No    Alcohol/week: 0.0 standard drinks  . Drug use: No     Family Hx: The patient's family history includes Colon cancer in her brother.  ROS:   Please see the history of present illness.     All other systems reviewed and are negative.   Prior CV studies:   The following studies were reviewed today:  09/2015 echo Study Conclusions  - Left ventricle: The cavity size was normal. Wall thickness was  increased in a pattern of mild LVH. Systolic function was normal.  The estimated ejection fraction was  in the range of 60% to 65%.  Diastolic function is abnormal, indeterminate grade. Wall motion  was normal; there were no regional wall motion abnormalities. - Aortic valve: Mildly calcified annulus. Trileaflet; mildly  thickened leaflets. Valve area (VTI): 1.52 cm^2. Valve area  (Vmax): 1.43 cm^2. - Mitral valve: Mildly calcified annulus. Normal thickness leaflets  . - Technically adequate study.   11/2015 CTA neck IMPRESSION: 1. 75% stenosis of the left internal carotid artery at the bifurcation secondary to prominent posterior lateral soft tissue neck calcified plaque. 2. Atherosclerotic calcifications at the right carotid bifurcation and origins the great vessels without other focal stenoses. 3. 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 4. Multilevel spondylosis of the cervical spine is most pronounced at C4-5 and C5-6.  Labs/Other Tests and Data Reviewed:    EKG:  n/a  Recent Labs: No results found for requested labs within last 8760 hours.   Recent Lipid Panel No results found for: CHOL, TRIG,  HDL, CHOLHDL, LDLCALC, LDLDIRECT  Wt Readings from Last 3 Encounters:  07/28/19 193 lb (87.5 kg)  07/24/19 186 lb (84.4 kg)  05/12/19 197 lb (89.4 kg)     Objective:    Vital Signs:   Today's Vitals   09/04/19 0932  Weight: 172 lb (78 kg)  Height: 5\' 6"  (1.676 m)   Body mass index is 27.76 kg/m. Normal affect. Normal speech pattern and tone. Comfortable, no apparent distress. No audible signs of SOB or wheezing.   ASSESSMENT & PLAN:    1. HTN  -some changes made in her regimen during recent admissino - home bp's remain at goal, continue current regiment  2. LE edema -no recent issues, she is takign 20mg  of lasix daily, will take additional 20mg  as needed - request most recent labs from pcp   COVID-19 Education: The signs and symptoms of COVID-19 were discussed with the patient and how to  seek care for testing (follow up with PCP or arrange E-visit).  The importance of social distancing was discussed today.  Time:   Today, I have spent 22 minutes with the patient with telehealth technology discussing the above problems.     Medication Adjustments/Labs and Tests Ordered: Current medicines are reviewed at length with the patient today.  Concerns regarding medicines are outlined above.   Tests Ordered: No orders of the defined types were placed in this encounter.   Medication Changes: No orders of the defined types were placed in this encounter.   Follow Up:  Either In Person or Virtual in 6 month(s)  Signed, Carlyle Dolly, MD  09/04/2019 8:13 AM    [place]

## 2019-09-05 ENCOUNTER — Other Ambulatory Visit: Payer: Self-pay | Admitting: Pharmacist

## 2019-09-05 NOTE — Patient Outreach (Signed)
[facility]) Care Management  [place]  09/05/2019  Rhonda Mejia 09/07/1928 [id number]   Reason for referral: Medication Review after discharge home from SNF  Referral source: [place] Acute Care Coordinator Current insurance: [employer]  PMHx includes but not limited to:  HTN, HLD, HFpEF (EF 50-55% 12/20), carotid stenosis, back pain, T2DM diet controlled, GERD, gout, hypothyroidism, neuropathy, pill dysphagia, noted recent hospitalization with fluid overload and anemia  Outreach:  Unsuccessful telephone call attempt #2 to patient's son, Rhonda Mejia.   HIPAA compliant voicemail left requesting a return call  Plan:  -I will make another outreach attempt to patient within 3-4 business days.    Ralene Bathe, PharmD, [place] [phone]

## 2019-09-08 ENCOUNTER — Other Ambulatory Visit: Payer: Self-pay | Admitting: Pharmacist

## 2019-09-08 ENCOUNTER — Ambulatory Visit: Payer: Self-pay | Admitting: Pharmacist

## 2019-09-08 NOTE — Patient Outreach (Signed)
[facility]) Care Management  [place]  09/08/2019  Rhonda Mejia 10/21/1928 [id number]  Reason for referral:Medication Reviewafter discharge home from SNF  Referral source:[facility] Post Acute Care Coordinator Current insurance:[employer]  PMHx includes but not limited to:HTN, HLD, HFpEF (EF 50-55% 12/20), carotid stenosis, back pain, T2DM diet controlled, GERD, gout, hypothyroidism, neuropathy, pill dysphagia, noted recent hospitalization with fluid overload and anemia  Outreach:  Unsuccessful telephone call attempt #3 to patient.   HIPAA compliant voicemail left requesting a return call  Plan:  -I will close [place] case at this time as I have been unable to establish and/or maintain contact with patient.  -I am happy to assist in the future as needed.    Ralene Bathe, PharmD, [place] [phone]

## 2019-09-11 DIAGNOSIS — N179 Acute kidney failure, unspecified: Secondary | ICD-10-CM | POA: Diagnosis not present

## 2019-09-11 DIAGNOSIS — I130 Hypertensive heart and chronic kidney disease with heart failure and stage 1 through stage 4 chronic kidney disease, or unspecified chronic kidney disease: Secondary | ICD-10-CM | POA: Diagnosis not present

## 2019-09-11 DIAGNOSIS — D631 Anemia in chronic kidney disease: Secondary | ICD-10-CM | POA: Diagnosis not present

## 2019-09-11 DIAGNOSIS — E1122 Type 2 diabetes mellitus with diabetic chronic kidney disease: Secondary | ICD-10-CM | POA: Diagnosis not present

## 2019-09-11 DIAGNOSIS — I5033 Acute on chronic diastolic (congestive) heart failure: Secondary | ICD-10-CM | POA: Diagnosis not present

## 2019-09-11 DIAGNOSIS — N189 Chronic kidney disease, unspecified: Secondary | ICD-10-CM | POA: Diagnosis not present

## 2019-09-12 ENCOUNTER — Other Ambulatory Visit: Payer: Self-pay

## 2019-09-12 DIAGNOSIS — Z23 Encounter for immunization: Secondary | ICD-10-CM | POA: Diagnosis not present

## 2019-09-12 DIAGNOSIS — I6523 Occlusion and stenosis of bilateral carotid arteries: Secondary | ICD-10-CM

## 2019-09-12 DIAGNOSIS — E119 Type 2 diabetes mellitus without complications: Secondary | ICD-10-CM | POA: Diagnosis not present

## 2019-09-12 DIAGNOSIS — I10 Essential (primary) hypertension: Secondary | ICD-10-CM | POA: Diagnosis not present

## 2019-09-18 DIAGNOSIS — N179 Acute kidney failure, unspecified: Secondary | ICD-10-CM | POA: Diagnosis not present

## 2019-09-18 DIAGNOSIS — I130 Hypertensive heart and chronic kidney disease with heart failure and stage 1 through stage 4 chronic kidney disease, or unspecified chronic kidney disease: Secondary | ICD-10-CM | POA: Diagnosis not present

## 2019-09-18 DIAGNOSIS — N189 Chronic kidney disease, unspecified: Secondary | ICD-10-CM | POA: Diagnosis not present

## 2019-09-18 DIAGNOSIS — I5033 Acute on chronic diastolic (congestive) heart failure: Secondary | ICD-10-CM | POA: Diagnosis not present

## 2019-09-18 DIAGNOSIS — D631 Anemia in chronic kidney disease: Secondary | ICD-10-CM | POA: Diagnosis not present

## 2019-09-18 DIAGNOSIS — E1122 Type 2 diabetes mellitus with diabetic chronic kidney disease: Secondary | ICD-10-CM | POA: Diagnosis not present

## 2019-09-27 DIAGNOSIS — E1122 Type 2 diabetes mellitus with diabetic chronic kidney disease: Secondary | ICD-10-CM | POA: Diagnosis not present

## 2019-09-27 DIAGNOSIS — I130 Hypertensive heart and chronic kidney disease with heart failure and stage 1 through stage 4 chronic kidney disease, or unspecified chronic kidney disease: Secondary | ICD-10-CM | POA: Diagnosis not present

## 2019-09-27 DIAGNOSIS — D631 Anemia in chronic kidney disease: Secondary | ICD-10-CM | POA: Diagnosis not present

## 2019-09-27 DIAGNOSIS — N179 Acute kidney failure, unspecified: Secondary | ICD-10-CM | POA: Diagnosis not present

## 2019-09-27 DIAGNOSIS — N189 Chronic kidney disease, unspecified: Secondary | ICD-10-CM | POA: Diagnosis not present

## 2019-09-27 DIAGNOSIS — I5033 Acute on chronic diastolic (congestive) heart failure: Secondary | ICD-10-CM | POA: Diagnosis not present

## 2019-10-09 DIAGNOSIS — E119 Type 2 diabetes mellitus without complications: Secondary | ICD-10-CM | POA: Diagnosis not present

## 2019-10-09 DIAGNOSIS — I10 Essential (primary) hypertension: Secondary | ICD-10-CM | POA: Diagnosis not present

## 2019-10-10 ENCOUNTER — Other Ambulatory Visit: Payer: Self-pay | Admitting: Vascular Surgery

## 2019-10-10 DIAGNOSIS — I6523 Occlusion and stenosis of bilateral carotid arteries: Secondary | ICD-10-CM

## 2019-10-11 DIAGNOSIS — Z23 Encounter for immunization: Secondary | ICD-10-CM | POA: Diagnosis not present

## 2019-10-12 ENCOUNTER — Other Ambulatory Visit: Payer: Medicare Other

## 2019-10-16 DIAGNOSIS — Z6831 Body mass index (BMI) 31.0-31.9, adult: Secondary | ICD-10-CM | POA: Diagnosis not present

## 2019-10-16 DIAGNOSIS — Z789 Other specified health status: Secondary | ICD-10-CM | POA: Diagnosis not present

## 2019-10-16 DIAGNOSIS — Z299 Encounter for prophylactic measures, unspecified: Secondary | ICD-10-CM | POA: Diagnosis not present

## 2019-10-16 DIAGNOSIS — M1712 Unilateral primary osteoarthritis, left knee: Secondary | ICD-10-CM | POA: Diagnosis not present

## 2019-10-16 DIAGNOSIS — I10 Essential (primary) hypertension: Secondary | ICD-10-CM | POA: Diagnosis not present

## 2019-10-17 ENCOUNTER — Ambulatory Visit: Payer: Medicare Other | Admitting: Vascular Surgery

## 2019-11-02 DIAGNOSIS — R5383 Other fatigue: Secondary | ICD-10-CM | POA: Diagnosis not present

## 2019-11-02 DIAGNOSIS — Y92009 Unspecified place in unspecified non-institutional (private) residence as the place of occurrence of the external cause: Secondary | ICD-10-CM | POA: Diagnosis not present

## 2019-11-02 DIAGNOSIS — I10 Essential (primary) hypertension: Secondary | ICD-10-CM | POA: Diagnosis not present

## 2019-11-02 DIAGNOSIS — Z299 Encounter for prophylactic measures, unspecified: Secondary | ICD-10-CM | POA: Diagnosis not present

## 2019-11-02 DIAGNOSIS — E1122 Type 2 diabetes mellitus with diabetic chronic kidney disease: Secondary | ICD-10-CM | POA: Diagnosis not present

## 2019-11-02 DIAGNOSIS — W19XXXA Unspecified fall, initial encounter: Secondary | ICD-10-CM | POA: Diagnosis not present

## 2019-11-06 DIAGNOSIS — E119 Type 2 diabetes mellitus without complications: Secondary | ICD-10-CM | POA: Diagnosis not present

## 2019-11-06 DIAGNOSIS — I10 Essential (primary) hypertension: Secondary | ICD-10-CM | POA: Diagnosis not present

## 2019-11-24 DIAGNOSIS — M25561 Pain in right knee: Secondary | ICD-10-CM | POA: Diagnosis not present

## 2019-11-24 DIAGNOSIS — M25562 Pain in left knee: Secondary | ICD-10-CM | POA: Diagnosis not present

## 2019-11-24 DIAGNOSIS — F329 Major depressive disorder, single episode, unspecified: Secondary | ICD-10-CM | POA: Diagnosis not present

## 2019-11-24 DIAGNOSIS — Z789 Other specified health status: Secondary | ICD-10-CM | POA: Diagnosis not present

## 2019-11-24 DIAGNOSIS — I10 Essential (primary) hypertension: Secondary | ICD-10-CM | POA: Diagnosis not present

## 2019-11-24 DIAGNOSIS — Z299 Encounter for prophylactic measures, unspecified: Secondary | ICD-10-CM | POA: Diagnosis not present

## 2019-11-24 DIAGNOSIS — E1165 Type 2 diabetes mellitus with hyperglycemia: Secondary | ICD-10-CM | POA: Diagnosis not present

## 2019-12-17 DIAGNOSIS — I10 Essential (primary) hypertension: Secondary | ICD-10-CM | POA: Diagnosis not present

## 2019-12-17 DIAGNOSIS — E119 Type 2 diabetes mellitus without complications: Secondary | ICD-10-CM | POA: Diagnosis not present

## 2020-01-01 DIAGNOSIS — R269 Unspecified abnormalities of gait and mobility: Secondary | ICD-10-CM | POA: Diagnosis not present

## 2020-01-01 DIAGNOSIS — M1711 Unilateral primary osteoarthritis, right knee: Secondary | ICD-10-CM | POA: Diagnosis not present

## 2020-01-01 DIAGNOSIS — M2560 Stiffness of unspecified joint, not elsewhere classified: Secondary | ICD-10-CM | POA: Diagnosis not present

## 2020-01-01 DIAGNOSIS — R262 Difficulty in walking, not elsewhere classified: Secondary | ICD-10-CM | POA: Diagnosis not present

## 2020-01-17 DIAGNOSIS — E119 Type 2 diabetes mellitus without complications: Secondary | ICD-10-CM | POA: Diagnosis not present

## 2020-01-17 DIAGNOSIS — I10 Essential (primary) hypertension: Secondary | ICD-10-CM | POA: Diagnosis not present

## 2020-01-29 DIAGNOSIS — M1711 Unilateral primary osteoarthritis, right knee: Secondary | ICD-10-CM | POA: Diagnosis not present

## 2020-02-02 DIAGNOSIS — M1712 Unilateral primary osteoarthritis, left knee: Secondary | ICD-10-CM | POA: Diagnosis not present

## 2020-02-05 DIAGNOSIS — M1711 Unilateral primary osteoarthritis, right knee: Secondary | ICD-10-CM | POA: Diagnosis not present

## 2020-02-08 DIAGNOSIS — M1712 Unilateral primary osteoarthritis, left knee: Secondary | ICD-10-CM | POA: Diagnosis not present

## 2020-02-12 DIAGNOSIS — M1711 Unilateral primary osteoarthritis, right knee: Secondary | ICD-10-CM | POA: Diagnosis not present

## 2020-02-15 DIAGNOSIS — E1151 Type 2 diabetes mellitus with diabetic peripheral angiopathy without gangrene: Secondary | ICD-10-CM | POA: Diagnosis not present

## 2020-02-15 DIAGNOSIS — E1140 Type 2 diabetes mellitus with diabetic neuropathy, unspecified: Secondary | ICD-10-CM | POA: Diagnosis not present

## 2020-02-16 DIAGNOSIS — I10 Essential (primary) hypertension: Secondary | ICD-10-CM | POA: Diagnosis not present

## 2020-02-16 DIAGNOSIS — E119 Type 2 diabetes mellitus without complications: Secondary | ICD-10-CM | POA: Diagnosis not present

## 2020-02-28 ENCOUNTER — Telehealth: Payer: Self-pay | Admitting: Cardiology

## 2020-02-28 NOTE — Telephone Encounter (Signed)
°  Patient Consent for Virtual Visit         Rhonda Mejia has provided verbal consent on 02/28/2020 for a virtual visit (video or telephone).   CONSENT FOR VIRTUAL VISIT FOR:  Rhonda Mejia  By participating in this virtual visit I agree to the following:  I hereby voluntarily request, consent and authorize [place] and its employed or contracted physicians, physician assistants, nurse practitioners or other licensed health care professionals (the Practitioner), to provide me with telemedicine health care services (the Services") as deemed necessary by the treating Practitioner. I acknowledge and consent to receive the Services by the Practitioner via telemedicine. I understand that the telemedicine visit will involve communicating with the Practitioner through live audiovisual communication technology and the disclosure of certain medical information by electronic transmission. I acknowledge that I have been given the opportunity to request an in-person assessment or other available alternative prior to the telemedicine visit and am voluntarily participating in the telemedicine visit.  I understand that I have the right to withhold or withdraw my consent to the use of telemedicine in the course of my care at any time, without affecting my right to future care or treatment, and that the Practitioner or I may terminate the telemedicine visit at any time. I understand that I have the right to inspect all information obtained and/or recorded in the course of the telemedicine visit and may receive copies of available information for a reasonable fee.  I understand that some of the potential risks of receiving the Services via telemedicine include:   Delay or interruption in medical evaluation due to technological equipment failure or disruption;  Information transmitted may not be sufficient (e.g. poor resolution of images) to allow for appropriate medical decision making by the  Practitioner; and/or   In rare instances, security protocols could fail, causing a breach of personal health information.  Furthermore, I acknowledge that it is my responsibility to provide information about my medical history, conditions and care that is complete and accurate to the best of my ability. I acknowledge that Practitioner's advice, recommendations, and/or decision may be based on factors not within their control, such as incomplete or inaccurate data provided by me or distortions of diagnostic images or specimens that may result from electronic transmissions. I understand that the practice of medicine is not an exact science and that Practitioner makes no warranties or guarantees regarding treatment outcomes. I acknowledge that a copy of this consent can be made available to me via my patient portal ([facility]), or I can request a printed copy by calling the office of [place].    I understand that my insurance will be billed for this visit.   I have read or had this consent read to me.  I understand the contents of this consent, which adequately explains the benefits and risks of the Services being provided via telemedicine.   I have been provided ample opportunity to ask questions regarding this consent and the Services and have had my questions answered to my satisfaction.  I give my informed consent for the services to be provided through the use of telemedicine in my medical care

## 2020-03-04 ENCOUNTER — Encounter: Payer: Self-pay | Admitting: Cardiology

## 2020-03-04 ENCOUNTER — Telehealth (INDEPENDENT_AMBULATORY_CARE_PROVIDER_SITE_OTHER): Payer: Medicare Other | Admitting: Cardiology

## 2020-03-04 VITALS — BP 150/72 | Ht 66.0 in | Wt 182.0 lb

## 2020-03-04 DIAGNOSIS — I6523 Occlusion and stenosis of bilateral carotid arteries: Secondary | ICD-10-CM | POA: Diagnosis not present

## 2020-03-04 DIAGNOSIS — R600 Localized edema: Secondary | ICD-10-CM

## 2020-03-04 DIAGNOSIS — I10 Essential (primary) hypertension: Secondary | ICD-10-CM

## 2020-03-04 NOTE — Progress Notes (Signed)
Virtual Visit via Telephone Note   This visit type was conducted due to national recommendations for restrictions regarding the COVID-19 Pandemic (e.g. social distancing) in an effort to limit this patient's exposure and mitigate transmission in our community.  Due to her co-morbid illnesses, this patient is at least at moderate risk for complications without adequate follow up.  This format is felt to be most appropriate for this patient at this time.  The patient did not have access to video technology/had technical difficulties with video requiring transitioning to audio format only (telephone).  All issues noted in this document were discussed and addressed.  No physical exam could be performed with this format.  Please refer to the patient's chart for her  consent to telehealth for [facility].    Date:  03/04/2020   ID:  OTTIS SARNOWSKI, DOB [birth year], MRN [id number] The patient was identified using 2 identifiers.  Patient Location: Home Provider Location: Office/Clinic  PCP:  Monico Blitz, MD  Cardiologist:  Carlyle Dolly, MD  Electrophysiologist:  None   Evaluation Performed:  Follow-Up Visit  Chief Complaint:  Follow up  History of Present Illness:    Rhonda Mejia is a 86 y.o. female seen today for follow up of the following medical problema.   1. HTN - side effects on higher doses of norvasc, with LE edema and some fatigue.  - during a prior visit due to high SBPs in the 150s-160s we started labetaolol - on labetalol was having some low bp's, fatigue. We lowered the dose to 100mg  bid, early on since change bp's are improving and fatigue resolving. .  - most recently bp 129/52 and HR 56 with lower labeatlol - since admission off norvasc 2.5, hydralazine was lowered to 50mg  tid. Off labetalol, appears she may be on toprol instead   - she is compliant with meds  2. LE edema - echo 02/2015 difficult study ,with LVEF 50-55%, normal RV function.  Diastolic function not described.  - norvasc stopped due to LE edema. Lasix increased to 40mg  daily. Edema has improved  - repeat echo 09/2015 LVEF 60-65%, abnormal diastolic function grade indeterminate  - followed by vascular for chronic venous insufficiency -     -  admission [facility] with fluid overload and anemia Admission Labs: pro BNP 11209 Cr 1.78 Hgb 6.7 Trop 0.06 ABG 7.46/38/61 CT head: no acute process.  CXR cardiomegaly, pulm congestion RLE Venous US: no DVT EKG SR, LBBB 06/2019 echo: LVEF 50-55%, grade II diastolic dysfunction, normal RV, mod BAE, mild AS, mild to mod MR,  12/22 patient had more recent labs.famiyl reportzs Hgb was 10.5 and Cr 1.05 by those labs.    - ongoing swelling at times, controlled with lasix.   3. Carotid stenosis - carotid US 09/2015 with concern for distal left CCA stenosis, 1-39% right ICA stenosis.  - 11/2015 CTA neck with 19% LICA stenosis, focal contained dissecion left ICA, no significant RICA disease - followed by vascular. From there consultation CTA results thought to be misleading due to heavy calficications and fairly mild velocities by carotid US.   - had to cancel her 09/2019 appt with vascular.    SH: has had covid vaccine.   The patient does not have symptoms concerning for COVID-19 infection (fever, chills, cough, or new shortness of breath).    Past Medical History:  Diagnosis Date  . Cancer [facility])    left breast cancer  . Carotid artery occlusion   . Diabetes mellitus  without complication ([place])    diet controlled  . Family hx of colon cancer   . GERD (gastroesophageal reflux disease)   . High cholesterol   . History of gout   . Hypertension    for over 20 yrs  . Hypothyroid   . Neuropathy   . Pill dysphagia    Past Surgical History:  Procedure Laterality Date  . BACK SURGERY     5 rods and 5 screws and cage in back, has had 4 back surgeries.  Marland Kitchen BREAST LUMPECTOMY     left breast  . CATARACT  EXTRACTION W/PHACO Left 12/31/2014   Procedure: CATARACT EXTRACTION PHACO AND INTRAOCULAR LENS PLACEMENT (IOC);  Surgeon: Tonny Johnye Kist, MD;  Location: [facility] ORS;  Service: Ophthalmology;  Laterality: Left;  CDE:8.45  . CATARACT EXTRACTION W/PHACO Right 01/28/2015   Procedure: CATARACT EXTRACTION PHACO AND INTRAOCULAR LENS PLACEMENT RIGHT EYE;  Surgeon: Tonny Karman Biswell, MD;  Location: [facility] ORS;  Service: Ophthalmology;  Laterality: Right;  CDE:4.92  . CHOLECYSTECTOMY    . COLONOSCOPY N/A 10/21/2012   Procedure: COLONOSCOPY;  Surgeon: Rogene Houston, MD;  Location: [facility] ENDO SUITE;  Service: Endoscopy;  Laterality: N/A;  . ESOPHAGOGASTRODUODENOSCOPY (EGD) WITH ESOPHAGEAL DILATION N/A 06/09/2013   Procedure: ESOPHAGOGASTRODUODENOSCOPY (EGD) WITH ESOPHAGEAL DILATION;  Surgeon: Rogene Houston, MD;  Location: [facility];  Service: Endoscopy;  Laterality: N/A;  125  . KNEE SURGERY Right    arthroscopy x2  . SPINE SURGERY     3 surgeries by Dr. Jenne Campus,  Last surgery 2 years ago by Dr. Carloyn Manner in [place]     No outpatient medications have been marked as taking for the 03/04/20 encounter (Appointment) with Arnoldo Lenis, MD.     Allergies:   Crestor [rosuvastatin], Hydrocodone, Lescol [fluvastatin], Lipitor [atorvastatin], Mobic [meloxicam], Norvasc [amlodipine], Penicillins, Spironolactone, Sulfa antibiotics, Tramadol, and Zocor [simvastatin]   Social History   Tobacco Use  . Smoking status: Never Smoker  . Smokeless tobacco: Never Used  Vaping Use  . Vaping Use: Never used  Substance Use Topics  . Alcohol use: No    Alcohol/week: 0.0 standard drinks  . Drug use: No     Family Hx: The patient's family history includes Colon cancer in her brother.  ROS:   Please see the history of present illness.     All other systems reviewed and are negative.   Prior CV studies:   The following studies were reviewed today:  09/2015 echo Study Conclusions  - Left ventricle: The cavity size was normal.  Wall thickness was  increased in a pattern of mild LVH. Systolic function was normal.  The estimated ejection fraction was in the range of 60% to 65%.  Diastolic function is abnormal, indeterminate grade. Wall motion  was normal; there were no regional wall motion abnormalities. - Aortic valve: Mildly calcified annulus. Trileaflet; mildly  thickened leaflets. Valve area (VTI): 1.52 cm^2. Valve area  (Vmax): 1.43 cm^2. - Mitral valve: Mildly calcified annulus. Normal thickness leaflets  . - Technically adequate study.   11/2015 CTA neck IMPRESSION: 1. 75% stenosis of the left internal carotid artery at the bifurcation secondary to prominent posterior lateral soft tissue neck calcified plaque. 2. Atherosclerotic calcifications at the right carotid bifurcation and origins the great vessels without other focal stenoses. 3. 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 4. Multilevel spondylosis of the cervical spine is most pronounced at C4-5 and C5-6.  Labs/Other Tests and  Data Reviewed:    EKG:  No ECG reviewed.  Recent Labs: No results found for requested labs within last 8760 hours.   Recent Lipid Panel No results found for: CHOL, TRIG, HDL, CHOLHDL, LDLCALC, LDLDIRECT  Wt Readings from Last 3 Encounters:  09/04/19 172 lb (78 kg)  07/28/19 193 lb (87.5 kg)  07/24/19 186 lb (84.4 kg)     Objective:    Vital Signs:  There were no vitals taken for this visit.   Today's Vitals   03/04/20 1357  BP: (!) 150/72  Weight: 182 lb (82.6 kg)  Height: 5\' 6"  (1.676 m)   Body mass index is 29.38 kg/m. Normal affect. Normal speech pattern   ASSESSMENT & PLAN:    1. HTN  -elevated today, she will call with bp log at end of week to get a better idea of trends - continue current meds, would increase hydral if neccesary  2. LE edema - controlled, continue lasix  3. Carotid stenosis - continue to follow with  vascular - continue medical therapy.  COVID-19 Education: The signs and symptoms of COVID-19 were discussed with the patient and how to seek care for testing (follow up with PCP or arrange E-visit).  The importance of social distancing was discussed today.  Time:   Today, I have spent 14 minutes with the patient with telehealth technology discussing the above problems.     Medication Adjustments/Labs and Tests Ordered: Current medicines are reviewed at length with the patient today.  Concerns regarding medicines are outlined above.   Tests Ordered: No orders of the defined types were placed in this encounter.   Medication Changes: No orders of the defined types were placed in this encounter.   Follow Up:  In Person in 6 month(s)  Signed, Carlyle Dolly, MD  03/04/2020 12:37 PM    [place]

## 2020-03-04 NOTE — Patient Instructions (Signed)
Your physician wants you to follow-up in: 6 MONTHS WITH DR BRANCH You will receive a reminder letter in the mail two months in advance. If you don't receive a letter, please call our office to schedule the follow-up appointment.  Your physician recommends that you continue on your current medications as directed. Please refer to the Current Medication list given to you today.  CALL US Friday WITH BLOOD PRESSURE READINGS  Thank you for choosing [facility]!!   

## 2020-03-05 DIAGNOSIS — E119 Type 2 diabetes mellitus without complications: Secondary | ICD-10-CM | POA: Diagnosis not present

## 2020-03-05 DIAGNOSIS — I10 Essential (primary) hypertension: Secondary | ICD-10-CM | POA: Diagnosis not present

## 2020-03-06 DIAGNOSIS — E1122 Type 2 diabetes mellitus with diabetic chronic kidney disease: Secondary | ICD-10-CM | POA: Diagnosis not present

## 2020-03-06 DIAGNOSIS — I10 Essential (primary) hypertension: Secondary | ICD-10-CM | POA: Diagnosis not present

## 2020-03-06 DIAGNOSIS — E1142 Type 2 diabetes mellitus with diabetic polyneuropathy: Secondary | ICD-10-CM | POA: Diagnosis not present

## 2020-03-06 DIAGNOSIS — E1165 Type 2 diabetes mellitus with hyperglycemia: Secondary | ICD-10-CM | POA: Diagnosis not present

## 2020-03-06 DIAGNOSIS — Z6831 Body mass index (BMI) 31.0-31.9, adult: Secondary | ICD-10-CM | POA: Diagnosis not present

## 2020-03-06 DIAGNOSIS — R609 Edema, unspecified: Secondary | ICD-10-CM | POA: Diagnosis not present

## 2020-03-06 DIAGNOSIS — Z299 Encounter for prophylactic measures, unspecified: Secondary | ICD-10-CM | POA: Diagnosis not present

## 2020-03-08 ENCOUNTER — Telehealth: Payer: Self-pay | Admitting: Cardiology

## 2020-03-08 NOTE — Telephone Encounter (Signed)
New message    bp reading   Monday 142/72 hr 45 Tuesday 142/72 hr 45 Wednesday 152/72 hr 53 Thursday  150/61 hr 49 Friday 130/63 hr 54

## 2020-03-12 NOTE — Telephone Encounter (Signed)
Bp's are high, increase hydralazine to 75mg  tid   Zandra Abts MD

## 2020-03-12 NOTE — Telephone Encounter (Signed)
Patient informed and is reluctant to increasing hydralazine at this time saying her BP readings since these were called in have been around 130's. Patient is requesting to wait on increase. Advised to contact our office if her SBP is higher than 130 consecutively. Verbalized understanding.

## 2020-03-27 DIAGNOSIS — Z299 Encounter for prophylactic measures, unspecified: Secondary | ICD-10-CM | POA: Diagnosis not present

## 2020-03-27 DIAGNOSIS — E1165 Type 2 diabetes mellitus with hyperglycemia: Secondary | ICD-10-CM | POA: Diagnosis not present

## 2020-03-27 DIAGNOSIS — I10 Essential (primary) hypertension: Secondary | ICD-10-CM | POA: Diagnosis not present

## 2020-03-27 DIAGNOSIS — Z6831 Body mass index (BMI) 31.0-31.9, adult: Secondary | ICD-10-CM | POA: Diagnosis not present

## 2020-03-27 DIAGNOSIS — I6529 Occlusion and stenosis of unspecified carotid artery: Secondary | ICD-10-CM | POA: Diagnosis not present

## 2020-03-27 DIAGNOSIS — R600 Localized edema: Secondary | ICD-10-CM | POA: Diagnosis not present

## 2020-04-01 ENCOUNTER — Other Ambulatory Visit: Payer: Self-pay | Admitting: Orthopaedic Surgery

## 2020-04-01 DIAGNOSIS — I6529 Occlusion and stenosis of unspecified carotid artery: Secondary | ICD-10-CM | POA: Diagnosis not present

## 2020-04-01 DIAGNOSIS — M1712 Unilateral primary osteoarthritis, left knee: Secondary | ICD-10-CM

## 2020-04-06 ENCOUNTER — Other Ambulatory Visit: Payer: Self-pay

## 2020-04-06 DIAGNOSIS — I6523 Occlusion and stenosis of bilateral carotid arteries: Secondary | ICD-10-CM

## 2020-04-18 DIAGNOSIS — I10 Essential (primary) hypertension: Secondary | ICD-10-CM | POA: Diagnosis not present

## 2020-04-18 DIAGNOSIS — E119 Type 2 diabetes mellitus without complications: Secondary | ICD-10-CM | POA: Diagnosis not present

## 2020-04-26 DIAGNOSIS — Z299 Encounter for prophylactic measures, unspecified: Secondary | ICD-10-CM | POA: Diagnosis not present

## 2020-04-26 DIAGNOSIS — Z6831 Body mass index (BMI) 31.0-31.9, adult: Secondary | ICD-10-CM | POA: Diagnosis not present

## 2020-04-26 DIAGNOSIS — M7051 Other bursitis of knee, right knee: Secondary | ICD-10-CM | POA: Diagnosis not present

## 2020-04-26 DIAGNOSIS — M1711 Unilateral primary osteoarthritis, right knee: Secondary | ICD-10-CM | POA: Diagnosis not present

## 2020-04-26 DIAGNOSIS — I10 Essential (primary) hypertension: Secondary | ICD-10-CM | POA: Diagnosis not present

## 2020-04-29 DIAGNOSIS — M1712 Unilateral primary osteoarthritis, left knee: Secondary | ICD-10-CM | POA: Diagnosis not present

## 2020-04-29 DIAGNOSIS — Z6831 Body mass index (BMI) 31.0-31.9, adult: Secondary | ICD-10-CM | POA: Diagnosis not present

## 2020-04-29 DIAGNOSIS — Z299 Encounter for prophylactic measures, unspecified: Secondary | ICD-10-CM | POA: Diagnosis not present

## 2020-04-29 DIAGNOSIS — I10 Essential (primary) hypertension: Secondary | ICD-10-CM | POA: Diagnosis not present

## 2020-05-01 DIAGNOSIS — Z6830 Body mass index (BMI) 30.0-30.9, adult: Secondary | ICD-10-CM | POA: Diagnosis not present

## 2020-05-01 DIAGNOSIS — Z299 Encounter for prophylactic measures, unspecified: Secondary | ICD-10-CM | POA: Diagnosis not present

## 2020-05-01 DIAGNOSIS — I10 Essential (primary) hypertension: Secondary | ICD-10-CM | POA: Diagnosis not present

## 2020-05-01 DIAGNOSIS — M1710 Unilateral primary osteoarthritis, unspecified knee: Secondary | ICD-10-CM | POA: Diagnosis not present

## 2020-05-01 DIAGNOSIS — E1142 Type 2 diabetes mellitus with diabetic polyneuropathy: Secondary | ICD-10-CM | POA: Diagnosis not present

## 2020-05-01 DIAGNOSIS — E1165 Type 2 diabetes mellitus with hyperglycemia: Secondary | ICD-10-CM | POA: Diagnosis not present

## 2020-05-06 DIAGNOSIS — E1151 Type 2 diabetes mellitus with diabetic peripheral angiopathy without gangrene: Secondary | ICD-10-CM | POA: Diagnosis not present

## 2020-05-06 DIAGNOSIS — E1140 Type 2 diabetes mellitus with diabetic neuropathy, unspecified: Secondary | ICD-10-CM | POA: Diagnosis not present

## 2020-05-17 DIAGNOSIS — E119 Type 2 diabetes mellitus without complications: Secondary | ICD-10-CM | POA: Diagnosis not present

## 2020-05-17 DIAGNOSIS — I10 Essential (primary) hypertension: Secondary | ICD-10-CM | POA: Diagnosis not present

## 2020-05-29 DIAGNOSIS — Z23 Encounter for immunization: Secondary | ICD-10-CM | POA: Diagnosis not present

## 2020-06-05 ENCOUNTER — Other Ambulatory Visit: Payer: Self-pay | Admitting: *Deleted

## 2020-06-05 DIAGNOSIS — I6523 Occlusion and stenosis of bilateral carotid arteries: Secondary | ICD-10-CM

## 2020-06-18 DIAGNOSIS — I10 Essential (primary) hypertension: Secondary | ICD-10-CM | POA: Diagnosis not present

## 2020-06-18 DIAGNOSIS — E119 Type 2 diabetes mellitus without complications: Secondary | ICD-10-CM | POA: Diagnosis not present

## 2020-06-20 DIAGNOSIS — L97909 Non-pressure chronic ulcer of unspecified part of unspecified lower leg with unspecified severity: Secondary | ICD-10-CM | POA: Diagnosis not present

## 2020-06-20 DIAGNOSIS — Z6831 Body mass index (BMI) 31.0-31.9, adult: Secondary | ICD-10-CM | POA: Diagnosis not present

## 2020-06-20 DIAGNOSIS — Z299 Encounter for prophylactic measures, unspecified: Secondary | ICD-10-CM | POA: Diagnosis not present

## 2020-06-20 DIAGNOSIS — M545 Low back pain, unspecified: Secondary | ICD-10-CM | POA: Diagnosis not present

## 2020-06-20 DIAGNOSIS — E11622 Type 2 diabetes mellitus with other skin ulcer: Secondary | ICD-10-CM | POA: Diagnosis not present

## 2020-06-20 DIAGNOSIS — E1165 Type 2 diabetes mellitus with hyperglycemia: Secondary | ICD-10-CM | POA: Diagnosis not present

## 2020-06-20 DIAGNOSIS — I10 Essential (primary) hypertension: Secondary | ICD-10-CM | POA: Diagnosis not present

## 2020-07-01 ENCOUNTER — Ambulatory Visit: Payer: Medicare Other | Admitting: Vascular Surgery

## 2020-07-18 DIAGNOSIS — E119 Type 2 diabetes mellitus without complications: Secondary | ICD-10-CM | POA: Diagnosis not present

## 2020-07-18 DIAGNOSIS — I10 Essential (primary) hypertension: Secondary | ICD-10-CM | POA: Diagnosis not present

## 2020-07-25 ENCOUNTER — Other Ambulatory Visit: Payer: Self-pay

## 2020-07-25 ENCOUNTER — Ambulatory Visit (HOSPITAL_COMMUNITY)
Admission: RE | Admit: 2020-07-25 | Discharge: 2020-07-25 | Disposition: A | Discharge status: Home / Self Care | Payer: Medicare Other | Source: Ambulatory Visit | Attending: Vascular Surgery | Admitting: Vascular Surgery

## 2020-07-25 DIAGNOSIS — I700 Atherosclerosis of aorta: Secondary | ICD-10-CM | POA: Diagnosis not present

## 2020-07-25 DIAGNOSIS — I6523 Occlusion and stenosis of bilateral carotid arteries: Secondary | ICD-10-CM | POA: Insufficient documentation

## 2020-07-25 DIAGNOSIS — I671 Cerebral aneurysm, nonruptured: Secondary | ICD-10-CM | POA: Diagnosis not present

## 2020-07-25 MED ORDER — IOHEXOL 300 MG/ML  SOLN
75.0000 mL | Freq: Once | INTRAMUSCULAR | Status: AC | PRN
  Administered 2020-07-25: 50 mL via INTRAVENOUS

## 2020-07-26 LAB — POCT I-STAT CREATININE: Creatinine, Ser: 1.4 mg/dL — ABNORMAL HIGH (ref 0.44–1.00)

## 2020-07-29 ENCOUNTER — Other Ambulatory Visit: Payer: Self-pay

## 2020-07-29 ENCOUNTER — Encounter: Payer: Self-pay | Admitting: Vascular Surgery

## 2020-07-29 ENCOUNTER — Ambulatory Visit (INDEPENDENT_AMBULATORY_CARE_PROVIDER_SITE_OTHER): Payer: Medicare Other | Admitting: Vascular Surgery

## 2020-07-29 DIAGNOSIS — I6523 Occlusion and stenosis of bilateral carotid arteries: Secondary | ICD-10-CM

## 2020-07-29 NOTE — Progress Notes (Signed)
Virtual Visit via Telephone Note    I connected with Rhonda Mejia on 07/29/2020 by telephone and verified that I was speaking with the correct person using two identifiers. Patient was located at home and accompanied by no one. I am located at [place] office.   The limitations of evaluation and management by telemedicine and the availability of in person appointments have been previously discussed with the patient and are documented in the patients chart. The patient expressed understanding and consented to proceed.  PCP: Monico Blitz, MD   Chief Complaint: Carotid follow-up  History of Present Illness: Rhonda Mejia is a 87 y.o. female with known asymptomatic carotid stenosis.  She underwent recent CT scan and I am discussing this with her by telephone today.  She reports that she has fallen several times recently due to weakness in her lower extremities related to her back.  She walks with a walker.  Fortunately she has had no injury.  She denies any focal neurologic deficits.  Past Medical History:  Diagnosis Date  . Cancer [facility])    left breast cancer  . Carotid artery occlusion   . Diabetes mellitus without complication (HCC)    diet controlled  . Family hx of colon cancer   . GERD (gastroesophageal reflux disease)   . High cholesterol   . History of gout   . Hypertension    for over 20 yrs  . Hypothyroid   . Neuropathy   . Pill dysphagia     Past Surgical History:  Procedure Laterality Date  . BACK SURGERY     5 rods and 5 screws and cage in back, has had 4 back surgeries.  Marland Kitchen BREAST LUMPECTOMY     left breast  . CATARACT EXTRACTION W/PHACO Left 12/31/2014   Procedure: CATARACT EXTRACTION PHACO AND INTRAOCULAR LENS PLACEMENT (IOC);  Surgeon: Tonny Branch, MD;  Location: [facility];  Service: Ophthalmology;  Laterality: Left;  CDE:8.45  . CATARACT EXTRACTION W/PHACO Right 01/28/2015   Procedure: CATARACT EXTRACTION PHACO AND INTRAOCULAR LENS PLACEMENT RIGHT EYE;   Surgeon: Tonny Branch, MD;  Location: [facility];  Service: Ophthalmology;  Laterality: Right;  CDE:4.92  . CHOLECYSTECTOMY    . COLONOSCOPY N/A 10/21/2012   Procedure: COLONOSCOPY;  Surgeon: Rogene Houston, MD;  Location[facility];  Service: Endoscopy;  Laterality: N/A;  . ESOPHAGOGASTRODUODENOSCOPY (EGD) WITH ESOPHAGEAL DILATION N/A 06/09/2013   Procedure: ESOPHAGOGASTRODUODENOSCOPY (EGD) WITH ESOPHAGEAL DILATION;  Surgeon: Rogene Houston, MD;  [facility];  Service: Endoscopy;  Laterality: N/A;  125  . KNEE SURGERY Right    arthroscopy x2  . SPINE SURGERY     3 surgeries by Dr. Jenne Campus,  Last surgery 2 years ago by Dr. Carloyn Manner in [place]    No outpatient medications have been marked as taking for the 07/29/20 encounter (Office Visit) with Rosetta Posner, MD.    12 system ROS was negative unless otherwise noted in HPI   Observations/Objective: CT scan from 07/25/2020 was reviewed.  This reveals common carotid artery disease on the left at 65% and internal carotid at 65%.  No significant stenosis in her right carotid system Assessment and Plan:  I explained that this puts her at no increased risk for stroke from a statistical standpoint.  She will continue her usual activities.  She will seek attention should she have any neurologic deficits Follow Up Instructions:  1 year with ultrasound carotids   I discussed the assessment and treatment plan  with the patient. The patient was provided an opportunity to ask questions and all were answered. The patient agreed with the plan and demonstrated an understanding of the instructions.   The patient was advised to call back or seek an in-person evaluation if the symptoms worsen or if the condition fails to improve as anticipated.  I spent 10 minutes with the patient via telephone encounter.   Annamary Rummage [facility] [place] Office: [phone]  07/29/2020, 9:37 AM

## 2020-08-19 DIAGNOSIS — E039 Hypothyroidism, unspecified: Secondary | ICD-10-CM | POA: Diagnosis not present

## 2020-08-19 DIAGNOSIS — M109 Gout, unspecified: Secondary | ICD-10-CM | POA: Diagnosis not present

## 2020-08-19 DIAGNOSIS — I10 Essential (primary) hypertension: Secondary | ICD-10-CM | POA: Diagnosis not present

## 2020-08-19 DIAGNOSIS — M545 Low back pain, unspecified: Secondary | ICD-10-CM | POA: Diagnosis not present

## 2020-09-05 DIAGNOSIS — H5203 Hypermetropia, bilateral: Secondary | ICD-10-CM | POA: Diagnosis not present

## 2020-09-05 DIAGNOSIS — H04123 Dry eye syndrome of bilateral lacrimal glands: Secondary | ICD-10-CM | POA: Diagnosis not present

## 2020-09-05 DIAGNOSIS — H5212 Myopia, left eye: Secondary | ICD-10-CM | POA: Diagnosis not present

## 2020-09-05 DIAGNOSIS — Z961 Presence of intraocular lens: Secondary | ICD-10-CM | POA: Diagnosis not present

## 2020-09-05 DIAGNOSIS — H40003 Preglaucoma, unspecified, bilateral: Secondary | ICD-10-CM | POA: Diagnosis not present

## 2020-09-05 DIAGNOSIS — E119 Type 2 diabetes mellitus without complications: Secondary | ICD-10-CM | POA: Diagnosis not present

## 2020-09-05 DIAGNOSIS — H524 Presbyopia: Secondary | ICD-10-CM | POA: Diagnosis not present

## 2020-09-05 DIAGNOSIS — H52223 Regular astigmatism, bilateral: Secondary | ICD-10-CM | POA: Diagnosis not present

## 2020-09-10 ENCOUNTER — Ambulatory Visit: Payer: Medicare Other | Admitting: Cardiology

## 2020-09-10 NOTE — Progress Notes (Deleted)
Clinical Summary Rhonda Mejia is a 87 y.o.female seen today for follow up of the following medical problema.   1. HTN - side effects on higher doses of norvasc, with LE edema and some fatigue.  -during a priorvisit due to high SBPs in the 150s-160s we started labetaolol - on labetalol was having some low bp's, fatigue. We lowered the dose to 100mg  bid, early on since change bp's are improving and fatigue resolving. .  - most recently bp 129/52 and HR 56 with lower labeatlol - since admission off norvasc 2.5, hydralazine was lowered to 50mg  tid. Off labetalol, appears she may be on toprol instead   - she is compliant with meds  2. LE edema - echo 02/2015 difficult study ,with LVEF 50-55%, normal RV function. Diastolic function not described.  - norvasc stopped due to LE edema. Lasix increased to 40mg  daily. Edema has improved  - repeat echo 09/2015 LVEF 60-65%, abnormal diastolic function grade indeterminate  - followed by vascular for chronic venous insufficiency -     -  admission [facility] with fluid overload and anemia Admission Labs: pro BNP 11209 Cr 1.78 Hgb 6.7 Trop 0.06 ABG 7.46/38/61 CT head: no acute process.  CXR cardiomegaly, pulm congestion RLE Venous US: no DVT EKG SR, LBBB 06/2019 echo: LVEF 50-55%, grade II diastolic dysfunction, normal RV, mod BAE, mild AS, mild to mod MR,  12/22 patient had more recent labs.famiyl reportzs Hgb was 10.5 and Cr 1.05 by those labs.    - ongoing swelling at times, controlled with lasix.   3. Carotid stenosis - carotid US 09/2015 with concern for distal left CCA stenosis, 1-39% right ICA stenosis.  - 11/2015 CTA neck with 40% LICA stenosis, focal contained dissecion left ICA, no significant RICA disease - followed by vascular. From there consultation CTA results thought to be misleading due to heavy calficications and fairly mild velocities by carotid US.  - had to cancel her 09/2019 appt with  vascular.    SH: has had covid vaccine.    Past Medical History:  Diagnosis Date  . Cancer [facility])    left breast cancer  . Carotid artery occlusion   . Diabetes mellitus without complication (HCC)    diet controlled  . Family hx of colon cancer   . GERD (gastroesophageal reflux disease)   . High cholesterol   . History of gout   . Hypertension    for over 20 yrs  . Hypothyroid   . Neuropathy   . Pill dysphagia      Allergies  Allergen Reactions  . Penicillins     Rash   . Sulfa Antibiotics     Unknown reaction      Current Outpatient Medications  Medication Sig Dispense Refill  . allopurinol (ZYLOPRIM) 300 MG tablet Take 300 mg by mouth daily.    . AMITIZA 24 MCG capsule Take 24 mcg by mouth daily as needed.   1  . aspirin EC 81 MG tablet Take 81 mg by mouth daily.    . Cyanocobalamin (B-12) 2500 MCG TABS Take 1 tablet by mouth daily.     . diclofenac sodium (VOLTAREN) 1 % GEL APPLY 2-4 GRAMS TO THE AFFECTED AREA TWICE DAILY 981 g 2  . folic acid (FOLVITE) 1 MG tablet Take 1 mg by mouth daily.    . furosemide (LASIX) 20 MG tablet Take 40 mg by mouth daily.     Marland Kitchen gabapentin (NEURONTIN) 300 MG capsule Take 300  mg by mouth 3 (three) times daily.   2  . hydrALAZINE (APRESOLINE) 50 MG tablet Take 50 mg by mouth 3 (three) times daily.    . hydrocortisone 2.5 % ointment APPLY A SMALL AMOUNT TO ITCHY PATCHES ON FACE TWICE DAILY  0  . ibuprofen (ADVIL,MOTRIN) 800 MG tablet TAKE ONE TABLET BY MOUTH EVERY 8 HOURS AS NEEDED WITH FOOD  0  . levothyroxine (SYNTHROID) 112 MCG tablet Take 112 mcg by mouth daily.    . metoprolol succinate (TOPROL-XL) 50 MG 24 hr tablet Take 50 mg by mouth daily. Take with or immediately following a meal.    . omeprazole (PRILOSEC) 40 MG capsule Take 40 mg by mouth daily.    Marland Kitchen pyridOXINE (VITAMIN B-6) 100 MG tablet Take 100 mg by mouth daily.    . traMADol (ULTRAM) 50 MG tablet Take 100 mg by mouth 2 (two) times daily.     Marland Kitchen triamcinolone cream  (KENALOG) 0.1 % APPLY TO ITCHY SKIN DAILY (DO NOT USE ON FACE)  1  . venlafaxine XR (EFFEXOR-XR) 75 MG 24 hr capsule Take 1 capsule by mouth daily.     No current facility-administered medications for this visit.     Past Surgical History:  Procedure Laterality Date  . BACK SURGERY     5 rods and 5 screws and cage in back, has had 4 back surgeries.  Marland Kitchen BREAST LUMPECTOMY     left breast  . CATARACT EXTRACTION W/PHACO Left 12/31/2014   Procedure: CATARACT EXTRACTION PHACO AND INTRAOCULAR LENS PLACEMENT (IOC);  Surgeon: Tonny Dahna Hattabaugh, MD;  Location: [facility];  Service: Ophthalmology;  Laterality: Left;  CDE:8.45  . CATARACT EXTRACTION W/PHACO Right 01/28/2015   Procedure: CATARACT EXTRACTION PHACO AND INTRAOCULAR LENS PLACEMENT RIGHT EYE;  Surgeon: Tonny Cassia Fein, MD;  Location: [facility];  Service: Ophthalmology;  Laterality: Right;  CDE:4.92  . CHOLECYSTECTOMY    . COLONOSCOPY N/A 10/21/2012   Procedure: COLONOSCOPY;  Surgeon: Rogene Houston, MD;  [facility];  Service: Endoscopy;  Laterality: N/A;  . ESOPHAGOGASTRODUODENOSCOPY (EGD) WITH ESOPHAGEAL DILATION N/A 06/09/2013   Procedure: ESOPHAGOGASTRODUODENOSCOPY (EGD) WITH ESOPHAGEAL DILATION;  Surgeon: Rogene Houston, MD;  [facility];  Service: Endoscopy;  Laterality: N/A;  125  . KNEE SURGERY Right    arthroscopy x2  . SPINE SURGERY     3 surgeries by Dr. Jenne Campus,  Last surgery 2 years ago by Dr. Carloyn Manner in [place] Reactions  . Penicillins     Rash   . Sulfa Antibiotics     Unknown reaction       Family History  Problem Relation Age of Onset  . Colon cancer Brother      Social History Rhonda Mejia reports that she has never smoked. She has never used smokeless tobacco. Rhonda Mejia reports no history of alcohol use.   Review of Systems CONSTITUTIONAL: No weight loss, fever, chills, weakness or fatigue.  HEENT: Eyes: No visual loss, blurred vision, double vision or yellow  sclerae.No hearing loss, sneezing, congestion, runny nose or sore throat.  SKIN: No rash or itching.  CARDIOVASCULAR:  RESPIRATORY: No shortness of breath, cough or sputum.  GASTROINTESTINAL: No anorexia, nausea, vomiting or diarrhea. No abdominal pain or blood.  GENITOURINARY: No burning on urination, no polyuria NEUROLOGICAL: No headache, dizziness, syncope, paralysis, ataxia, numbness or tingling in the extremities. No change in bowel or bladder control.  MUSCULOSKELETAL: No muscle, back pain, joint pain or  stiffness.  LYMPHATICS: No enlarged nodes. No history of splenectomy.  PSYCHIATRIC: No history of depression or anxiety.  ENDOCRINOLOGIC: No reports of sweating, cold or heat intolerance. No polyuria or polydipsia.  Marland Kitchen   Physical Examination There were no vitals filed for this visit. There were no vitals filed for this visit.  Gen: resting comfortably, no acute distress HEENT: no scleral icterus, pupils equal round and reactive, no palptable cervical adenopathy,  CV Resp: Clear to auscultation bilaterally GI: abdomen is soft, non-tender, non-distended, normal bowel sounds, no hepatosplenomegaly MSK: extremities are warm, no edema.  Skin: warm, no rash Neuro:  no focal deficits Psych: appropriate affect   Diagnostic Studies 09/2015 echo Study Conclusions  - Left ventricle: The cavity size was normal. Wall thickness was  increased in a pattern of mild LVH. Systolic function was normal.  The estimated ejection fraction was in the range of 60% to 65%.  Diastolic function is abnormal, indeterminate grade. Wall motion  was normal; there were no regional wall motion abnormalities. - Aortic valve: Mildly calcified annulus. Trileaflet; mildly  thickened leaflets. Valve area (VTI): 1.52 cm^2. Valve area  (Vmax): 1.43 cm^2. - Mitral valve: Mildly calcified annulus. Normal thickness leaflets  . - Technically adequate study.   11/2015 CTA neck IMPRESSION: 1. 75%  stenosis of the left internal carotid artery at the bifurcation secondary to prominent posterior lateral soft tissue neck calcified plaque. 2. Atherosclerotic calcifications at the right carotid bifurcation and origins the great vessels without other focal stenoses. 3. 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 4. Multilevel spondylosis of the cervical spine is most pronounced at C4-5 and C5-6.    Assessment and Plan  1. HTN  -elevated today, she will call with bp log at end of week to get a better idea of trends - continue current meds, would increase hydral if neccesary  2. LE edema - controlled, continue lasix  3. Carotid stenosis - continue to follow with vascular - continue medical therapy.       Arnoldo Lenis, M.D., F.A.C.C.

## 2020-09-11 ENCOUNTER — Encounter: Payer: Self-pay | Admitting: Cardiology

## 2020-09-11 DIAGNOSIS — M5416 Radiculopathy, lumbar region: Secondary | ICD-10-CM | POA: Diagnosis not present

## 2020-09-18 ENCOUNTER — Telehealth: Payer: Self-pay | Admitting: Cardiology

## 2020-09-18 MED ORDER — FUROSEMIDE 20 MG PO TABS: 40.0000 mg | ORAL_TABLET | Freq: Every day | ORAL | 0 refills | Status: DC

## 2020-09-18 MED ORDER — HYDRALAZINE HCL 50 MG PO TABS: 50.0000 mg | ORAL_TABLET | Freq: Three times a day (TID) | ORAL | 0 refills | Status: DC

## 2020-09-18 MED ORDER — METOPROLOL SUCCINATE ER 50 MG PO TB24: 50.0000 mg | ORAL_TABLET | Freq: Every day | ORAL | 0 refills | Status: DC

## 2020-09-18 NOTE — Telephone Encounter (Signed)
*  STAT* If patient is at the pharmacy, call can be transferred to refill team.   1. Which medications need to be refilled? (please list name of each medication and dose if known) ALL medications prescribed by Dr. Kandice Moos of: hydrALAZINE (APRESOLINE) 50 MG tablet & metoprolol succinate (TOPROL-XL) 50 MG 24 hr tablet  2. Which pharmacy/location (including street and city if local pharmacy) is medication to be sent to? [address], [place] [address] ph# [phone]  3. Do they need a 30 day or 90 day supply? 90  Patient has moved to [place] [facility] with her son Viola. He is going to have her care transferred up there.

## 2020-09-18 NOTE — Telephone Encounter (Signed)
30 day supply sent to pharmacy of cardiac meds

## 2020-09-24 DIAGNOSIS — I872 Venous insufficiency (chronic) (peripheral): Secondary | ICD-10-CM | POA: Diagnosis not present

## 2020-09-24 DIAGNOSIS — M170 Bilateral primary osteoarthritis of knee: Secondary | ICD-10-CM | POA: Diagnosis not present

## 2020-09-24 DIAGNOSIS — E7800 Pure hypercholesterolemia, unspecified: Secondary | ICD-10-CM | POA: Diagnosis not present

## 2020-09-24 DIAGNOSIS — I10 Essential (primary) hypertension: Secondary | ICD-10-CM | POA: Diagnosis not present

## 2020-09-24 DIAGNOSIS — G629 Polyneuropathy, unspecified: Secondary | ICD-10-CM | POA: Diagnosis not present

## 2020-09-24 DIAGNOSIS — Z862 Personal history of diseases of the blood and blood-forming organs and certain disorders involving the immune mechanism: Secondary | ICD-10-CM | POA: Diagnosis not present

## 2020-09-24 DIAGNOSIS — R296 Repeated falls: Secondary | ICD-10-CM | POA: Diagnosis not present

## 2020-09-24 DIAGNOSIS — E039 Hypothyroidism, unspecified: Secondary | ICD-10-CM | POA: Diagnosis not present

## 2020-09-24 DIAGNOSIS — E1142 Type 2 diabetes mellitus with diabetic polyneuropathy: Secondary | ICD-10-CM | POA: Diagnosis not present

## 2020-09-25 DIAGNOSIS — M4316 Spondylolisthesis, lumbar region: Secondary | ICD-10-CM | POA: Diagnosis not present

## 2020-09-25 DIAGNOSIS — M4805 Spinal stenosis, thoracolumbar region: Secondary | ICD-10-CM | POA: Diagnosis not present

## 2020-09-25 DIAGNOSIS — M48061 Spinal stenosis, lumbar region without neurogenic claudication: Secondary | ICD-10-CM | POA: Diagnosis not present

## 2020-09-25 DIAGNOSIS — M4726 Other spondylosis with radiculopathy, lumbar region: Secondary | ICD-10-CM | POA: Diagnosis not present

## 2020-10-03 DIAGNOSIS — M5416 Radiculopathy, lumbar region: Secondary | ICD-10-CM | POA: Diagnosis not present

## 2020-10-09 DIAGNOSIS — Z862 Personal history of diseases of the blood and blood-forming organs and certain disorders involving the immune mechanism: Secondary | ICD-10-CM | POA: Diagnosis not present

## 2020-10-09 DIAGNOSIS — G629 Polyneuropathy, unspecified: Secondary | ICD-10-CM | POA: Diagnosis not present

## 2020-10-09 DIAGNOSIS — I10 Essential (primary) hypertension: Secondary | ICD-10-CM | POA: Diagnosis not present

## 2020-10-09 DIAGNOSIS — E1142 Type 2 diabetes mellitus with diabetic polyneuropathy: Secondary | ICD-10-CM | POA: Diagnosis not present

## 2020-10-09 DIAGNOSIS — E7800 Pure hypercholesterolemia, unspecified: Secondary | ICD-10-CM | POA: Diagnosis not present

## 2020-10-09 DIAGNOSIS — E039 Hypothyroidism, unspecified: Secondary | ICD-10-CM | POA: Diagnosis not present

## 2020-10-15 MED ORDER — FUROSEMIDE 20 MG PO TABS: 40.0000 mg | ORAL_TABLET | Freq: Every day | ORAL | 0 refills | Status: AC

## 2020-10-15 MED ORDER — METOPROLOL SUCCINATE ER 50 MG PO TB24: 50.0000 mg | ORAL_TABLET | Freq: Every day | ORAL | 0 refills | Status: AC

## 2020-10-15 MED ORDER — HYDRALAZINE HCL 50 MG PO TABS: 50.0000 mg | ORAL_TABLET | Freq: Three times a day (TID) | ORAL | 0 refills | Status: AC

## 2020-10-17 DIAGNOSIS — M5416 Radiculopathy, lumbar region: Secondary | ICD-10-CM | POA: Diagnosis not present

## 2020-10-22 DIAGNOSIS — M25462 Effusion, left knee: Secondary | ICD-10-CM | POA: Diagnosis not present

## 2020-10-22 DIAGNOSIS — M11261 Other chondrocalcinosis, right knee: Secondary | ICD-10-CM | POA: Diagnosis not present

## 2020-10-22 DIAGNOSIS — M170 Bilateral primary osteoarthritis of knee: Secondary | ICD-10-CM | POA: Diagnosis not present

## 2020-10-22 DIAGNOSIS — M25461 Effusion, right knee: Secondary | ICD-10-CM | POA: Diagnosis not present

## 2020-10-22 DIAGNOSIS — M11262 Other chondrocalcinosis, left knee: Secondary | ICD-10-CM | POA: Diagnosis not present

## 2020-10-29 DIAGNOSIS — M5416 Radiculopathy, lumbar region: Secondary | ICD-10-CM | POA: Diagnosis not present

## 2020-10-31 DIAGNOSIS — R7303 Prediabetes: Secondary | ICD-10-CM | POA: Diagnosis not present

## 2020-10-31 DIAGNOSIS — I779 Disorder of arteries and arterioles, unspecified: Secondary | ICD-10-CM | POA: Diagnosis not present

## 2020-10-31 DIAGNOSIS — E7800 Pure hypercholesterolemia, unspecified: Secondary | ICD-10-CM | POA: Diagnosis not present

## 2020-10-31 DIAGNOSIS — M170 Bilateral primary osteoarthritis of knee: Secondary | ICD-10-CM | POA: Diagnosis not present

## 2020-10-31 DIAGNOSIS — I350 Nonrheumatic aortic (valve) stenosis: Secondary | ICD-10-CM | POA: Diagnosis not present

## 2020-10-31 DIAGNOSIS — D649 Anemia, unspecified: Secondary | ICD-10-CM | POA: Diagnosis not present

## 2020-10-31 DIAGNOSIS — M545 Low back pain, unspecified: Secondary | ICD-10-CM | POA: Diagnosis not present

## 2020-10-31 DIAGNOSIS — N1832 Chronic kidney disease, stage 3b: Secondary | ICD-10-CM | POA: Diagnosis not present

## 2020-10-31 DIAGNOSIS — Z8679 Personal history of other diseases of the circulatory system: Secondary | ICD-10-CM | POA: Diagnosis not present

## 2020-10-31 DIAGNOSIS — G629 Polyneuropathy, unspecified: Secondary | ICD-10-CM | POA: Diagnosis not present

## 2020-10-31 DIAGNOSIS — I10 Essential (primary) hypertension: Secondary | ICD-10-CM | POA: Diagnosis not present

## 2020-11-22 DIAGNOSIS — E860 Dehydration: Secondary | ICD-10-CM | POA: Diagnosis not present

## 2020-11-22 DIAGNOSIS — R1110 Vomiting, unspecified: Secondary | ICD-10-CM | POA: Diagnosis not present

## 2020-11-22 DIAGNOSIS — R296 Repeated falls: Secondary | ICD-10-CM | POA: Diagnosis not present

## 2020-11-22 DIAGNOSIS — R112 Nausea with vomiting, unspecified: Secondary | ICD-10-CM | POA: Diagnosis not present

## 2020-11-22 DIAGNOSIS — S0990XA Unspecified injury of head, initial encounter: Secondary | ICD-10-CM | POA: Diagnosis not present

## 2020-11-26 DIAGNOSIS — I10 Essential (primary) hypertension: Secondary | ICD-10-CM | POA: Diagnosis not present

## 2020-11-26 DIAGNOSIS — N1832 Chronic kidney disease, stage 3b: Secondary | ICD-10-CM | POA: Diagnosis not present

## 2020-11-26 DIAGNOSIS — R296 Repeated falls: Secondary | ICD-10-CM | POA: Diagnosis not present

## 2020-11-26 DIAGNOSIS — R001 Bradycardia, unspecified: Secondary | ICD-10-CM | POA: Diagnosis not present

## 2020-12-03 DIAGNOSIS — I6523 Occlusion and stenosis of bilateral carotid arteries: Secondary | ICD-10-CM | POA: Diagnosis present

## 2020-12-03 DIAGNOSIS — J9601 Acute respiratory failure with hypoxia: Secondary | ICD-10-CM | POA: Diagnosis not present

## 2020-12-03 DIAGNOSIS — Z515 Encounter for palliative care: Secondary | ICD-10-CM | POA: Diagnosis not present

## 2020-12-03 DIAGNOSIS — I083 Combined rheumatic disorders of mitral, aortic and tricuspid valves: Secondary | ICD-10-CM | POA: Diagnosis not present

## 2020-12-03 DIAGNOSIS — I214 Non-ST elevation (NSTEMI) myocardial infarction: Secondary | ICD-10-CM | POA: Diagnosis present

## 2020-12-03 DIAGNOSIS — I058 Other rheumatic mitral valve diseases: Secondary | ICD-10-CM | POA: Diagnosis not present

## 2020-12-03 DIAGNOSIS — R7989 Other specified abnormal findings of blood chemistry: Secondary | ICD-10-CM | POA: Diagnosis not present

## 2020-12-03 DIAGNOSIS — I4891 Unspecified atrial fibrillation: Secondary | ICD-10-CM | POA: Diagnosis not present

## 2020-12-03 DIAGNOSIS — R296 Repeated falls: Secondary | ICD-10-CM | POA: Diagnosis present

## 2020-12-03 DIAGNOSIS — I2584 Coronary atherosclerosis due to calcified coronary lesion: Secondary | ICD-10-CM | POA: Diagnosis present

## 2020-12-03 DIAGNOSIS — R519 Headache, unspecified: Secondary | ICD-10-CM | POA: Diagnosis not present

## 2020-12-03 DIAGNOSIS — I440 Atrioventricular block, first degree: Secondary | ICD-10-CM | POA: Diagnosis not present

## 2020-12-03 DIAGNOSIS — Z20822 Contact with and (suspected) exposure to covid-19: Secondary | ICD-10-CM | POA: Diagnosis present

## 2020-12-03 DIAGNOSIS — Z95811 Presence of heart assist device: Secondary | ICD-10-CM | POA: Diagnosis not present

## 2020-12-03 DIAGNOSIS — R072 Precordial pain: Secondary | ICD-10-CM | POA: Diagnosis not present

## 2020-12-03 DIAGNOSIS — I5021 Acute systolic (congestive) heart failure: Secondary | ICD-10-CM | POA: Diagnosis not present

## 2020-12-03 DIAGNOSIS — I10 Essential (primary) hypertension: Secondary | ICD-10-CM | POA: Diagnosis not present

## 2020-12-03 DIAGNOSIS — I509 Heart failure, unspecified: Secondary | ICD-10-CM | POA: Diagnosis not present

## 2020-12-03 DIAGNOSIS — I350 Nonrheumatic aortic (valve) stenosis: Secondary | ICD-10-CM | POA: Diagnosis present

## 2020-12-03 DIAGNOSIS — E872 Acidosis: Secondary | ICD-10-CM | POA: Diagnosis not present

## 2020-12-03 DIAGNOSIS — W19XXXA Unspecified fall, initial encounter: Secondary | ICD-10-CM | POA: Diagnosis not present

## 2020-12-03 DIAGNOSIS — N179 Acute kidney failure, unspecified: Secondary | ICD-10-CM | POA: Diagnosis not present

## 2020-12-03 DIAGNOSIS — E7800 Pure hypercholesterolemia, unspecified: Secondary | ICD-10-CM | POA: Diagnosis present

## 2020-12-03 DIAGNOSIS — R570 Cardiogenic shock: Secondary | ICD-10-CM | POA: Diagnosis not present

## 2020-12-03 DIAGNOSIS — M109 Gout, unspecified: Secondary | ICD-10-CM | POA: Diagnosis present

## 2020-12-03 DIAGNOSIS — D72829 Elevated white blood cell count, unspecified: Secondary | ICD-10-CM | POA: Diagnosis present

## 2020-12-03 DIAGNOSIS — R54 Age-related physical debility: Secondary | ICD-10-CM | POA: Diagnosis not present

## 2020-12-03 DIAGNOSIS — R079 Chest pain, unspecified: Secondary | ICD-10-CM | POA: Diagnosis not present

## 2020-12-03 DIAGNOSIS — I2510 Atherosclerotic heart disease of native coronary artery without angina pectoris: Secondary | ICD-10-CM | POA: Diagnosis present

## 2020-12-03 DIAGNOSIS — R0603 Acute respiratory distress: Secondary | ICD-10-CM | POA: Diagnosis not present

## 2020-12-03 DIAGNOSIS — E785 Hyperlipidemia, unspecified: Secondary | ICD-10-CM | POA: Diagnosis not present

## 2020-12-03 DIAGNOSIS — I051 Rheumatic mitral insufficiency: Secondary | ICD-10-CM | POA: Diagnosis not present

## 2020-12-03 DIAGNOSIS — E039 Hypothyroidism, unspecified: Secondary | ICD-10-CM | POA: Diagnosis present

## 2020-12-03 DIAGNOSIS — I110 Hypertensive heart disease with heart failure: Secondary | ICD-10-CM | POA: Diagnosis not present

## 2020-12-03 DIAGNOSIS — I080 Rheumatic disorders of both mitral and aortic valves: Secondary | ICD-10-CM | POA: Diagnosis not present

## 2020-12-03 DIAGNOSIS — I5189 Other ill-defined heart diseases: Secondary | ICD-10-CM | POA: Diagnosis not present

## 2020-12-03 DIAGNOSIS — I739 Peripheral vascular disease, unspecified: Secondary | ICD-10-CM | POA: Diagnosis not present

## 2020-12-03 DIAGNOSIS — Z01810 Encounter for preprocedural cardiovascular examination: Secondary | ICD-10-CM | POA: Diagnosis not present

## 2020-12-03 DIAGNOSIS — R55 Syncope and collapse: Secondary | ICD-10-CM | POA: Diagnosis not present

## 2020-12-03 DIAGNOSIS — E1142 Type 2 diabetes mellitus with diabetic polyneuropathy: Secondary | ICD-10-CM | POA: Diagnosis present

## 2020-12-03 DIAGNOSIS — I472 Ventricular tachycardia: Secondary | ICD-10-CM | POA: Diagnosis not present

## 2020-12-03 DIAGNOSIS — I447 Left bundle-branch block, unspecified: Secondary | ICD-10-CM | POA: Diagnosis present

## 2020-12-03 DIAGNOSIS — Z955 Presence of coronary angioplasty implant and graft: Secondary | ICD-10-CM | POA: Diagnosis not present

## 2020-12-03 DIAGNOSIS — R5383 Other fatigue: Secondary | ICD-10-CM | POA: Diagnosis not present

## 2020-12-03 DIAGNOSIS — E119 Type 2 diabetes mellitus without complications: Secondary | ICD-10-CM | POA: Diagnosis not present

## 2020-12-03 DIAGNOSIS — I25118 Atherosclerotic heart disease of native coronary artery with other forms of angina pectoris: Secondary | ICD-10-CM | POA: Diagnosis not present

## 2020-12-18 DEATH — deceased
